# Patient Record
Sex: Female | Born: 1995 | Race: White | Hispanic: No | Marital: Married | Smoking: Never smoker
Health system: Southern US, Community
[De-identification: ages and names within clinical notes are randomized; demographics above are authoritative.]

## PROBLEM LIST (undated history)

## (undated) ENCOUNTER — Inpatient Hospital Stay: Payer: Self-pay

## (undated) DIAGNOSIS — F32A Depression, unspecified: Secondary | ICD-10-CM

## (undated) DIAGNOSIS — F329 Major depressive disorder, single episode, unspecified: Secondary | ICD-10-CM

## (undated) DIAGNOSIS — G43909 Migraine, unspecified, not intractable, without status migrainosus: Secondary | ICD-10-CM

## (undated) DIAGNOSIS — M940 Chondrocostal junction syndrome [Tietze]: Secondary | ICD-10-CM

## (undated) DIAGNOSIS — D649 Anemia, unspecified: Secondary | ICD-10-CM

## (undated) DIAGNOSIS — N920 Excessive and frequent menstruation with regular cycle: Secondary | ICD-10-CM

## (undated) DIAGNOSIS — R000 Tachycardia, unspecified: Secondary | ICD-10-CM

## (undated) DIAGNOSIS — F419 Anxiety disorder, unspecified: Secondary | ICD-10-CM

## (undated) HISTORY — PX: ADENOIDECTOMY: SUR15

## (undated) HISTORY — DX: Chondrocostal junction syndrome (tietze): M94.0

## (undated) HISTORY — PX: TONSILLECTOMY: SUR1361

## (undated) HISTORY — DX: Migraine, unspecified, not intractable, without status migrainosus: G43.909

## (undated) HISTORY — DX: Anxiety disorder, unspecified: F41.9

## (undated) HISTORY — DX: Major depressive disorder, single episode, unspecified: F32.9

## (undated) HISTORY — DX: Excessive and frequent menstruation with regular cycle: N92.0

## (undated) HISTORY — DX: Tachycardia, unspecified: R00.0

## (undated) HISTORY — DX: Depression, unspecified: F32.A

---
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
## 2016-06-29 ENCOUNTER — Encounter: Payer: Self-pay | Admitting: Emergency Medicine

## 2016-06-29 ENCOUNTER — Emergency Department
Admission: EM | Admit: 2016-06-29 | Discharge: 2016-06-29 | Disposition: A | Discharge status: Home / Self Care | Payer: Medicaid Other | Attending: Emergency Medicine | Admitting: Emergency Medicine

## 2016-06-29 DIAGNOSIS — N390 Urinary tract infection, site not specified: Secondary | ICD-10-CM | POA: Diagnosis not present

## 2016-06-29 DIAGNOSIS — R110 Nausea: Secondary | ICD-10-CM | POA: Diagnosis not present

## 2016-06-29 DIAGNOSIS — R350 Frequency of micturition: Secondary | ICD-10-CM | POA: Diagnosis present

## 2016-06-29 DIAGNOSIS — J45909 Unspecified asthma, uncomplicated: Secondary | ICD-10-CM | POA: Insufficient documentation

## 2016-06-29 LAB — URINALYSIS COMPLETE WITH MICROSCOPIC (ARMC ONLY)
Bilirubin Urine: NEGATIVE
Glucose, UA: NEGATIVE mg/dL
HGB URINE DIPSTICK: NEGATIVE
Ketones, ur: NEGATIVE mg/dL
LEUKOCYTES UA: NEGATIVE
NITRITE: NEGATIVE
PH: 6 (ref 5.0–8.0)
PROTEIN: NEGATIVE mg/dL
SPECIFIC GRAVITY, URINE: 1.017 (ref 1.005–1.030)

## 2016-06-29 LAB — POCT PREGNANCY, URINE: PREG TEST UR: NEGATIVE

## 2016-06-29 MED ORDER — NITROFURANTOIN MONOHYD MACRO 100 MG PO CAPS: 100.0000 mg | ORAL_CAPSULE | Freq: Two times a day (BID) | ORAL | 0 refills | Status: AC

## 2016-06-29 NOTE — ED Notes (Signed)
See triage note states she has been having some urinary /lower abd pressure for couple of days   Denies any n/v//d or fever or vaginal discharge.

## 2016-06-29 NOTE — Discharge Instructions (Signed)
Increase fluids. Take medication as directed. Follow-up with her primary care doctor in 10 days for recheck of your urine.

## 2016-06-29 NOTE — ED Triage Notes (Signed)
Reports pelvic pressure.  Mom states "we think she is pregnant but the test has been negative. Denies vag discharge or trouble with urination.

## 2016-06-29 NOTE — ED Provider Notes (Signed)
[facility] Emergency Department Provider Note  ____________________________________________   First MD Initiated Contact with Patient 06/29/16 [phone]     (approximate)  I have reviewed the triage vital signs and the nursing notes.   HISTORY  Chief Complaint Pelvic Pain   HPI Alexandra Joseph is a 20 y.o. female is here with complaint of pelvic pressure. Mother is with patient and states "we think she is pregnant". Patient had a negative pregnancy test at home and also at her primary care office. She continues to have symptoms of nausea and pressure that is unrelieved. Patient is here to have another pregnancy test done despite 2 negative test already. She is unaware of any fever or chills. There is some nausea. No vomiting. She states there is urinary frequency but no burning with urination. Currently she rates her pain as a 1/10. At this time patient has not actually missed a period. Mother states her last period was 7/19 through 7/24.   Past Medical History:  Diagnosis Date  . Asthma   . Costochondritis   . Depression   . Menorrhagia   . Migraine   . Tachycardia     Patient Active Problem List   Diagnosis Date Noted  . Headache 10/24/2014  . Dizziness 09/14/2014  . Chest pain at rest 09/14/2014  . Tachycardia 09/14/2014  . Weakness 09/14/2014    Past Surgical History:  Procedure Laterality Date  . ADENOIDECTOMY    . TONSILLECTOMY      Prior to Admission medications   Medication Sig Start Date End Date Taking? Authorizing Provider  escitalopram (LEXAPRO) 10 MG tablet Take 10 mg by mouth daily.  11/07/14   Historical Provider, MD  ibuprofen (ADVIL,MOTRIN) 200 MG tablet Take 200 mg by mouth every 6 (six) hours as needed.    Historical Provider, MD  Levonorgestrel-Ethinyl Estradiol (CAMRESE) 0.15-0.03 &0.01 MG tablet Take 1 tablet by mouth daily.    Historical Provider, MD  nitrofurantoin, macrocrystal-monohydrate, (MACROBID) 100 MG capsule  Take 1 capsule (100 mg total) by mouth 2 (two) times daily. 06/29/16 07/06/16  Tommi Rumpshonda L Summers, PA-C  propranolol (INDERAL) 10 MG tablet Take 1 tablet (10 mg total) by mouth 3 (three) times daily as needed. 10/24/14   Antonieta Ibaimothy J Gollan, MD    Allergies Review of patient's allergies indicates no known allergies.  Family History  Problem Relation Age of Onset  . Hypertension Mother   . Arrhythmia Mother     palpitations  . Hyperlipidemia Mother   . Heart attack Maternal Uncle 42  . Heart attack Maternal Grandmother   . Heart disease Maternal Grandmother     CABG  . Hypertension Maternal Grandfather     Social History Social History  Substance Use Topics  . Smoking status: Never Smoker  . Smokeless tobacco: Never Used  . Alcohol use No    Review of Systems Constitutional: No fever/chills Cardiovascular: Denies chest pain. Respiratory: Denies shortness of breath. Gastrointestinal: Positive for pelvic pressure. No nausea, no vomiting.  No diarrhea.  Genitourinary: Negative for dysuria. Positive for urinary frequency. Musculoskeletal: Negative for back pain. Skin: Negative for rash. Neurological: Negative for headaches, focal weakness or numbness.  10-point ROS otherwise negative.  ____________________________________________   PHYSICAL EXAM:  VITAL SIGNS: ED Triage Vitals  Enc Vitals Group     BP 06/29/16 0915 108/61     Pulse Rate 06/29/16 0915 76     Resp 06/29/16 0915 16     Temp 06/29/16 0915 98.1 F (36.7  C)     Temp Source 06/29/16 0915 Oral     SpO2 06/29/16 0915 100 %     Weight 06/29/16 0916 132 lb (59.9 kg)     Height 06/29/16 0916 5' (1.524 m)     Head Circumference --      Peak Flow --      Pain Score 06/29/16 0916 1     Pain Loc --      Pain Edu? --      Excl. in GC? --     Constitutional: Alert and oriented. Well appearing and in no acute distress. Eyes: Conjunctivae are normal. PERRL. EOMI. Head: Atraumatic. Nose: No  congestion/rhinnorhea. Neck: No stridor.   Cardiovascular: Normal rate, regular rhythm. Grossly normal heart sounds.  Good peripheral circulation. Respiratory: Normal respiratory effort.  No retractions. Lungs CTAB. Gastrointestinal: Soft and nontender. No distention. Bowel sounds normoactive 4 quadrants. Musculoskeletal: Moves upper and lower extremities without any difficulty. Normal gait was noted. Neurologic:  Normal speech and language. No gross focal neurologic deficits are appreciated. No gait instability. Skin:  Skin is warm, dry and intact. No rash noted. Psychiatric: Mood and affect are normal. Speech and behavior are normal.  ____________________________________________   LABS (all labs ordered are listed, but only abnormal results are displayed)  Labs Reviewed  URINALYSIS COMPLETEWITH MICROSCOPIC ([facility] ONLY) - Abnormal; Notable for the following:       Result Value   Color, Urine YELLOW (*)    APPearance CLOUDY (*)    Bacteria, UA RARE (*)    Squamous Epithelial / LPF 6-30 (*)    All other components within normal limits  URINE CULTURE  POC URINE PREG, ED  POCT PREGNANCY, URINE     PROCEDURES  Procedure(s) performed: None  Procedures  Critical Care performed: No  ____________________________________________   INITIAL IMPRESSION / ASSESSMENT AND PLAN / ED COURSE  Pertinent labs & imaging results that were available during my care of the patient were reviewed by me and considered in my medical decision making (see chart for details).    Clinical Course  Patient's pregnancy test here in the emergency room was also negative. Patient was made aware that she has a urinary tract infection and placed on Macrobid 100 mg twice a day for 7 days. Patient is follow-up with her primary care doctor. Mother requested an ultrasound to "make sure the tests his right". Patient's mother was referred back to her child's PCP for further testing. Patient is to increase fluids,  take medication and follow-up with her primary care doctor.   ____________________________________________   FINAL CLINICAL IMPRESSION(S) / ED DIAGNOSES  Final diagnoses:  UTI (lower urinary tract infection)      NEW MEDICATIONS STARTED DURING THIS VISIT:  Discharge Medication List as of 06/29/2016 10:18 AM    START taking these medications   Details  nitrofurantoin, macrocrystal-monohydrate, (MACROBID) 100 MG capsule Take 1 capsule (100 mg total) by mouth 2 (two) times daily., Starting Mon 06/29/2016, Until Mon 07/06/2016, Print         Note:  This document was prepared using Dragon voice recognition software and may include unintentional dictation errors.    Tommi RumpsRhonda L Summers, PA-C 06/29/16 1109    Myrna Blazeravid Matthew Schaevitz, MD 06/29/16 [phone]

## 2016-06-30 LAB — URINE CULTURE
Culture: NO GROWTH
Special Requests: NORMAL

## 2016-07-08 ENCOUNTER — Encounter: Payer: Self-pay | Admitting: Obstetrics and Gynecology

## 2016-07-08 ENCOUNTER — Other Ambulatory Visit: Payer: Self-pay | Admitting: Obstetrics and Gynecology

## 2016-07-08 ENCOUNTER — Ambulatory Visit (INDEPENDENT_AMBULATORY_CARE_PROVIDER_SITE_OTHER): Payer: Medicaid Other | Admitting: Obstetrics and Gynecology

## 2016-07-08 VITALS — BP 99/62 | HR 72 | Ht 60.0 in | Wt 134.2 lb

## 2016-07-08 DIAGNOSIS — G43001 Migraine without aura, not intractable, with status migrainosus: Secondary | ICD-10-CM | POA: Diagnosis not present

## 2016-07-08 DIAGNOSIS — N926 Irregular menstruation, unspecified: Secondary | ICD-10-CM | POA: Diagnosis not present

## 2016-07-08 LAB — POCT URINE PREGNANCY: Preg Test, Ur: NEGATIVE

## 2016-07-08 NOTE — Progress Notes (Signed)
    GYNECOLOGY CLINIC PROGRESS NOTE  Subjective:    Alexandra Joseph is a 20 y.o. female who presents for evaluation of amenorrhea. She believes she could be pregnant. Pregnancy is desired. Sexual Activity: single partner, contraception: none. Current symptoms also include: breast tenderness, fatigue, morning sickness, nausea and positive home pregnancy test however also notes second pregnancy test was negative. Last period was normal.  Patient's last menstrual period was 06/03/2016 (exact date).   Of note, patient notes she was seen in the ER last week, was placed on antibiotic for UTI. Has almost completed course.     Past Medical History:  Diagnosis Date  . Asthma   . Costochondritis   . Depression   . Menorrhagia   . Migraine   . Tachycardia    Family History  Problem Relation Age of Onset  . Hypertension Mother   . Arrhythmia Mother     palpitations  . Hyperlipidemia Mother   . Heart attack Maternal Uncle 42  . Heart attack Maternal Grandmother   . Heart disease Maternal Grandmother     CABG  . Hypertension Maternal Grandfather     Past Surgical History:  Procedure Laterality Date  . ADENOIDECTOMY    . TONSILLECTOMY      Social History   Social History  . Marital status: Single    Spouse name: N/A  . Number of children: N/A  . Years of education: N/A   Occupational History  . Not on file.   Social History Main Topics  . Smoking status: Never Smoker  . Smokeless tobacco: Never Used  . Alcohol use No  . Drug use: No  . Sexual activity: Yes    Birth control/ protection: None   Other Topics Concern  . Not on file   Social History Narrative  . No narrative on file    No current outpatient prescriptions on file prior to visit.   No current facility-administered medications on file prior to visit.     No Known Allergies   Review of Systems A comprehensive review of systems was negative except for: Neurological: positive for headaches, (has h/o  migraines, controlled with OCT meds)     Objective:    BP 99/62 (BP Location: Left Arm, Patient Position: Sitting, Cuff Size: Normal)   Pulse 72   Ht 5' (1.524 m)   Wt 134 lb 3.2 oz (60.9 kg)   LMP 06/03/2016 (Exact Date)   BMI 26.21 kg/m  General: alert, no distress and no acute distress    Lab Review Urine HCG: negative    Assessment:    Absence of menstruation.    Migraines  Plan:    Pregnancy Test: UPT today negative but home UPT positive.  Will order BHCG to confirm or r/o pregnancy.   If positive, will have patient scheduled for NOB intake, and ultrasound when appropriate.  H/o migraines, advised on Tylenol products only if pregnancy confirmed.   Hildred LaserAnika Kissa Campoy, MD [facility]

## 2016-07-09 ENCOUNTER — Telehealth: Payer: Self-pay | Admitting: Obstetrics and Gynecology

## 2016-07-09 LAB — BETA HCG QUANT (REF LAB)

## 2016-07-09 NOTE — Telephone Encounter (Signed)
Called pt's mother at number listed below, informed her of negative results. Beta less than 1.

## 2016-07-09 NOTE — Telephone Encounter (Signed)
Pt called and wanted you to call her mom with results instead of her. [phone]

## 2016-08-01 ENCOUNTER — Encounter: Payer: Self-pay | Admitting: Emergency Medicine

## 2016-08-01 ENCOUNTER — Emergency Department
Admission: EM | Admit: 2016-08-01 | Discharge: 2016-08-01 | Disposition: A | Discharge status: Home / Self Care | Payer: Medicaid Other | Attending: Emergency Medicine | Admitting: Emergency Medicine

## 2016-08-01 DIAGNOSIS — R102 Pelvic and perineal pain: Secondary | ICD-10-CM | POA: Diagnosis present

## 2016-08-01 DIAGNOSIS — J45909 Unspecified asthma, uncomplicated: Secondary | ICD-10-CM | POA: Diagnosis not present

## 2016-08-01 DIAGNOSIS — R109 Unspecified abdominal pain: Secondary | ICD-10-CM | POA: Insufficient documentation

## 2016-08-01 LAB — CBC
HEMATOCRIT: 40 % (ref 35.0–47.0)
Hemoglobin: 13.8 g/dL (ref 12.0–16.0)
MCH: 29 pg (ref 26.0–34.0)
MCHC: 34.4 g/dL (ref 32.0–36.0)
MCV: 84.3 fL (ref 80.0–100.0)
PLATELETS: 190 10*3/uL (ref 150–440)
RBC: 4.74 MIL/uL (ref 3.80–5.20)
RDW: 13.2 % (ref 11.5–14.5)
WBC: 5.7 10*3/uL (ref 3.6–11.0)

## 2016-08-01 LAB — COMPREHENSIVE METABOLIC PANEL
ALT: 20 U/L (ref 14–54)
AST: 21 U/L (ref 15–41)
Albumin: 4.8 g/dL (ref 3.5–5.0)
Alkaline Phosphatase: 57 U/L (ref 38–126)
Anion gap: 3 — ABNORMAL LOW (ref 5–15)
BILIRUBIN TOTAL: 0.6 mg/dL (ref 0.3–1.2)
BUN: 8 mg/dL (ref 6–20)
CHLORIDE: 107 mmol/L (ref 101–111)
CO2: 27 mmol/L (ref 22–32)
Calcium: 9.4 mg/dL (ref 8.9–10.3)
Creatinine, Ser: 0.63 mg/dL (ref 0.44–1.00)
Glucose, Bld: 99 mg/dL (ref 65–99)
POTASSIUM: 3.7 mmol/L (ref 3.5–5.1)
Sodium: 137 mmol/L (ref 135–145)
TOTAL PROTEIN: 7.4 g/dL (ref 6.5–8.1)

## 2016-08-01 LAB — URINALYSIS COMPLETE WITH MICROSCOPIC (ARMC ONLY)
BILIRUBIN URINE: NEGATIVE
Glucose, UA: NEGATIVE mg/dL
HGB URINE DIPSTICK: NEGATIVE
KETONES UR: NEGATIVE mg/dL
NITRITE: NEGATIVE
PH: 7 (ref 5.0–8.0)
PROTEIN: NEGATIVE mg/dL
Specific Gravity, Urine: 1.008 (ref 1.005–1.030)

## 2016-08-01 LAB — POCT PREGNANCY, URINE: Preg Test, Ur: NEGATIVE

## 2016-08-01 LAB — HCG, QUANTITATIVE, PREGNANCY: hCG, Beta Chain, Quant, S: <1 m[IU]/mL (ref <5)

## 2016-08-01 NOTE — ED Triage Notes (Signed)
Lower abdominal pain x 2 days. Has positive pregnancy test in august with a subsequent negative test.

## 2016-08-01 NOTE — ED Provider Notes (Signed)
[facility] Emergency Department Provider Note  ____________________________________________   I have reviewed the triage vital signs and the nursing notes.   HISTORY  Chief Complaint Abdominal Pain    HPI Alexandra Joseph is a 20 y.o. female with a history of painful and heavy periods in the past. She was placed on birth control pills which she was taken off of in April because they were causing her migraines. Since April she has had regular periods but last month around this time around the time of her period for the week prior and a few days after she had a strong pelvic cramping sensation that she had a period that was less strong than normal. Lasted less long in other words. Now, she is due for her menstrual period again and she is again having this cramping sensation in her lower abdomen. She denies any dysuria or urinary frequency and her last visit urine culture was negative. The patient has had no fever no chills she is hungry she has been eating and drinking fine she's had normal bowel movements she denies any sudden onset of discomfort, and a gradual onset pressure that's been there for a few days which she feels is leading up to her menstrual period. She did get married in the later part of the summer, and she is concerned that she might be pregnant although she has not had a positive pregnancy test since her last period. She did have one at home but then had a negative one here. She denies any vaginal discharge, she denies any severe pains,       Past Medical History:  Diagnosis Date  . Asthma   . Costochondritis   . Depression   . Menorrhagia   . Migraine   . Tachycardia     Patient Active Problem List   Diagnosis Date Noted  . Headache 10/24/2014  . Dizziness 09/14/2014  . Chest pain at rest 09/14/2014  . Tachycardia 09/14/2014  . Weakness 09/14/2014    Past Surgical History:  Procedure Laterality Date  . ADENOIDECTOMY    .  TONSILLECTOMY      Prior to Admission medications   Not on File    Allergies Review of patient's allergies indicates no known allergies.  Family History  Problem Relation Age of Onset  . Hypertension Mother   . Arrhythmia Mother     palpitations  . Hyperlipidemia Mother   . Heart attack Maternal Uncle 42  . Heart attack Maternal Grandmother   . Heart disease Maternal Grandmother     CABG  . Hypertension Maternal Grandfather     Social History Social History  Substance Use Topics  . Smoking status: Never Smoker  . Smokeless tobacco: Never Used  . Alcohol use No    Review of Systems Constitutional: No fever/chills Eyes: No visual changes. ENT: No sore throat. No stiff neck no neck pain Cardiovascular: Denies chest pain. Respiratory: Denies shortness of breath. Gastrointestinal:   no vomiting.  No diarrhea.  No constipation. Genitourinary: Negative for dysuria. Musculoskeletal: Negative lower extremity swelling Skin: Negative for rash. Neurological: Negative for severe headaches, focal weakness or numbness. 10-point ROS otherwise negative.  ____________________________________________   PHYSICAL EXAM:  VITAL SIGNS: ED Triage Vitals  Enc Vitals Group     BP 08/01/16 1858 113/66     Pulse Rate 08/01/16 1858 79     Resp 08/01/16 1858 18     Temp 08/01/16 1858 98.1 F (36.7 C)     Temp  Source 08/01/16 1858 Oral     SpO2 08/01/16 1858 98 %     Weight 08/01/16 1900 136 lb (61.7 kg)     Height 08/01/16 1900 5' (1.524 m)     Head Circumference --      Peak Flow --      Pain Score 08/01/16 1901 8     Pain Loc --      Pain Edu? --      Excl. in GC? --     Constitutional: Alert and oriented. Well appearing and in no acute distress. Eyes: Conjunctivae are normal. PERRL. EOMI. Head: Atraumatic. Nose: No congestion/rhinnorhea. Mouth/Throat: Mucous membranes are moist.  Oropharynx non-erythematous. Neck: No stridor.   Nontender with no  meningismus Cardiovascular: Normal rate, regular rhythm. Grossly normal heart sounds.  Good peripheral circulation. Respiratory: Normal respiratory effort.  No retractions. Lungs CTAB. Abdominal: Soft and nontender. No distention. No guarding no rebound Back:  There is no focal tenderness or step off.  there is no midline tenderness there are no lesions noted. there is no CVA tenderness GU: Patient declines Musculoskeletal: No lower extremity tenderness, no upper extremity tenderness. No joint effusions, no DVT signs strong distal pulses no edema Neurologic:  Normal speech and language. No gross focal neurologic deficits are appreciated.  Skin:  Skin is warm, dry and intact. No rash noted. Psychiatric: Mood and affect are normal. Speech and behavior are normal.  ____________________________________________   LABS (all labs ordered are listed, but only abnormal results are displayed)  Labs Reviewed  COMPREHENSIVE METABOLIC PANEL - Abnormal; Notable for the following:       Result Value   Anion gap 3 (*)    All other components within normal limits  URINALYSIS COMPLETEWITH MICROSCOPIC ([facility] ONLY) - Abnormal; Notable for the following:    Color, Urine STRAW (*)    APPearance CLEAR (*)    Leukocytes, UA 3+ (*)    Bacteria, UA RARE (*)    Squamous Epithelial / LPF 6-30 (*)    All other components within normal limits  URINE CULTURE  CHLAMYDIA/NGC RT PCR ([facility] ONLY)  CBC  HCG, QUANTITATIVE, PREGNANCY  POC URINE PREG, ED  POCT PREGNANCY, URINE   ____________________________________________  EKG   ____________________________________________  RADIOLOGY  I reviewed any imaging ordered by me or triage that were performed during my shift and, if possible, patient and/or family made aware of any abnormal findings. ____________________________________________   PROCEDURES  Procedure(s) performed: None  Procedures  Critical Care performed:  None  ____________________________________________   INITIAL IMPRESSION / ASSESSMENT AND PLAN / ED COURSE  Pertinent labs & imaging results that were available during my care of the patient were reviewed by me and considered in my medical decision making (see chart for details).  Patient here with recurrent pelvic cramping, low suspicion for UTI ovarian cyst appendicitis diverticulitis ovarian torsion or any other acute pathology that Friday. However, I did offer the patient a pelvic exam and ultrasound to further evaluate her pelvic cramping and she declines. She understands the risks benefits and alternative of declining and her family are in the room and they agree. She is having suprapubic cramping prior to her menstrual. After going off birth control this is not unusual. I did stress to her the need to follow-up with OB/GYN and to return to the emergency room if she feels any new or worrisome symptoms. Patient is very comfortable with this plan and she is relieved that she is not pregnant. Said her urine  but there are also epithelial cells, her last urine was negative I don't believe she has a cyclical, periodic, urinary tract infection and I will defer antibiotics as she has no symptoms of UTI pending culture.  Clinical Course   ____________________________________________   FINAL CLINICAL IMPRESSION(S) / ED DIAGNOSES  Final diagnoses:  None      This chart was dictated using voice recognition software.  Despite best efforts to proofread,  errors can occur which can change meaning.      Jeanmarie Plant, MD 08/01/16 [phone]

## 2016-08-03 LAB — URINE CULTURE

## 2016-11-16 NOTE — L&D Delivery Note (Signed)
Delivery Note At 0530 a viable and healthy female "Alexandra Joseph"  was delivered via  (Presentation:OA ;  ).  APGAR:8 ,9 ; weight 7#10oz  .   Placenta status: delivered intact with 3 vessel  Cord and dense calcification, 3cmold clot at anterior margin:  with the following complications: none  Anesthesia:  epidural Episiotomy:  none Lacerations:  1st Suture Repair: 3.0 vicryl rapide Est. Blood Loss (mL):  800  Mom to postpartum.  Baby to Couplet care / Skin to Skin.  Melody N Shambley 07/23/2017, 6:02 AM

## 2016-11-30 ENCOUNTER — Encounter: Payer: Self-pay | Admitting: Certified Nurse Midwife

## 2016-11-30 ENCOUNTER — Ambulatory Visit (INDEPENDENT_AMBULATORY_CARE_PROVIDER_SITE_OTHER): Admitting: Certified Nurse Midwife

## 2016-11-30 VITALS — BP 127/65 | HR 85 | Ht 60.0 in | Wt 133.2 lb

## 2016-11-30 DIAGNOSIS — Z3201 Encounter for pregnancy test, result positive: Secondary | ICD-10-CM

## 2016-11-30 DIAGNOSIS — N926 Irregular menstruation, unspecified: Secondary | ICD-10-CM | POA: Diagnosis not present

## 2016-11-30 LAB — POCT URINALYSIS DIPSTICK
Bilirubin, UA: NEGATIVE
GLUCOSE UA: NEGATIVE
Ketones, UA: NEGATIVE
Leukocytes, UA: NEGATIVE
Nitrite, UA: NEGATIVE
PROTEIN UA: NEGATIVE
RBC UA: NEGATIVE
UROBILINOGEN UA: NEGATIVE
pH, UA: 5

## 2016-11-30 LAB — POCT URINE PREGNANCY: Preg Test, Ur: POSITIVE — AB

## 2016-11-30 NOTE — Progress Notes (Signed)
Pt had 2 positive home pregnancy tests. C/o nausea and breast tenderness.

## 2016-11-30 NOTE — Patient Instructions (Signed)

## 2016-11-30 NOTE — Progress Notes (Signed)
GYN ENCOUNTER NOTE  Subjective:       Alexandra Joseph is a 21 y.o. G1P0 female is here for gynecologic evaluation of missed menses and positive home pregnancy test. Her mother, grandmother, and sister accompanied her to today's visit.   She had two (2) positive home pregnancy tests on 11/22/2016. Pt has been trying to get pregnancy for a few months. She is very excited.   Alexandra Joseph endorses nausea without vomiting and breast tenderness. Denies difficulty breathing or respiratory distress, chest pain, abdominal pain, vaginal bleeding, and leg pain or swelling.   Alexandra Joseph was married in July 2017. Her husband is in [employer] and currently stationed in [place]. She is unsure if she will finishing this pregnancy in [place] or in [place].    Gynecologic History Patient's last menstrual period was 10/16/2016 (exact date).   Contraception: none   EDD: 07/23/2017  Gestational age: 42 weeks 3 days  Obstetric History OB History  Gravida Para Term Preterm AB Living  1            SAB TAB Ectopic Multiple Live Births               # Outcome Date GA Lbr Len/2nd Weight Sex Delivery Anes PTL Lv  1 Current               Past Medical History:  Diagnosis Date  . Anxiety   . Asthma   . Costochondritis   . Depression   . Menorrhagia   . Migraine   . Tachycardia     Past Surgical History:  Procedure Laterality Date  . ADENOIDECTOMY    . TONSILLECTOMY      No current outpatient prescriptions on file prior to visit.   No current facility-administered medications on file prior to visit.     No Known Allergies  Social History   Social History  . Marital status: Single    Spouse name: N/A  . Number of children: N/A  . Years of education: N/A   Occupational History  . Not on file.   Social History Main Topics  . Smoking status: Never Smoker  . Smokeless tobacco: Never Used  . Alcohol use No  . Drug use: No  . Sexual activity: Yes    Birth control/ protection: None   Other Topics  Concern  . Not on file   Social History Narrative  . No narrative on file    Family History  Problem Relation Age of Onset  . Hypertension Mother   . Arrhythmia Mother     palpitations  . Hyperlipidemia Mother   . Heart attack Maternal Uncle 42  . Heart attack Maternal Grandmother   . Heart disease Maternal Grandmother     CABG  . Stroke Maternal Grandmother   . Osteoarthritis Maternal Grandmother   . Hypertension Maternal Grandfather   . Stroke Maternal Grandfather     The following portions of the patient's history were reviewed and updated as appropriate: allergies, current medications, past family history, past medical history, past social history, past surgical history and problem list.  Review of Systems  Review of Systems - Negative except as noted above History obtained from the patient    Objective:   BP 127/65   Pulse 85   Ht 5' (1.524 m)   Wt 133 lb 4 oz (60.4 kg)   LMP 10/16/2016 (Exact Date)   BMI 26.02 kg/m   Alert and oriented x 4  Physical exam: not indicated  Positive UPT  Assessment:   1. Missed menses  2. Positive pregnancy test  Plan:   1. Reviewed pregnancy nutrition, safe medications, and genetic testing. Handouts given.   2. Reviewed red flag symptoms and when to call.  3. RTC x 1-2 weeks for dating/viability US and Nurse intake.  4. Start PNV with DHA and folic acid, samples given.   Gunnar BullaJenkins Michelle Jerritt Cardoza, CNM

## 2016-12-08 ENCOUNTER — Telehealth: Payer: Self-pay | Admitting: Certified Nurse Midwife

## 2016-12-08 NOTE — Telephone Encounter (Signed)
Alexandra Joseph is [redacted] wks pregnant and is very sick. She has throwing up the last 3 days. She wants something for nausea

## 2016-12-08 NOTE — Telephone Encounter (Signed)
Called pt no answer. LM for pt informing her of the need to try vitamin b6 and Unisom before prescription medications, advised pt on how to take, also sent my chart message with pt education

## 2016-12-15 ENCOUNTER — Other Ambulatory Visit

## 2016-12-15 ENCOUNTER — Encounter

## 2016-12-18 ENCOUNTER — Ambulatory Visit (INDEPENDENT_AMBULATORY_CARE_PROVIDER_SITE_OTHER): Admitting: Certified Nurse Midwife

## 2016-12-18 ENCOUNTER — Other Ambulatory Visit: Payer: Self-pay | Admitting: Certified Nurse Midwife

## 2016-12-18 ENCOUNTER — Encounter: Payer: Self-pay | Admitting: Certified Nurse Midwife

## 2016-12-18 VITALS — BP 111/58 | HR 64 | Temp 98.4°F | Wt 129.2 lb

## 2016-12-18 DIAGNOSIS — Z3491 Encounter for supervision of normal pregnancy, unspecified, first trimester: Secondary | ICD-10-CM | POA: Diagnosis not present

## 2016-12-18 DIAGNOSIS — R109 Unspecified abdominal pain: Secondary | ICD-10-CM

## 2016-12-18 DIAGNOSIS — M549 Dorsalgia, unspecified: Secondary | ICD-10-CM

## 2016-12-18 LAB — POCT URINALYSIS DIPSTICK
BILIRUBIN UA: NEGATIVE
Blood, UA: NEGATIVE
Glucose, UA: NEGATIVE
KETONES UA: NEGATIVE
LEUKOCYTES UA: NEGATIVE
Nitrite, UA: NEGATIVE
Protein, UA: NEGATIVE
Spec Grav, UA: 1.02
Urobilinogen, UA: NEGATIVE
pH, UA: 5

## 2016-12-18 NOTE — Progress Notes (Signed)
Pt is here with c/o right side back pain radiating to front. Pt is approx [redacted] weeks pregnant.

## 2016-12-18 NOTE — Progress Notes (Signed)
Subjective:   Alexandra Joseph is a 21 y.o. G1P0 9 weeks 0 days being seen today for work in problem visit.  Patient reports backache and right kidney pain that comes and goes since Monday.  Pain: 6/10. She states that she has been unable to eat or drink due to pain, but has not taken any Tylenol.   Denies contractions, vaginal bleeding or leaking of fluid.   She denies difficulty breathing or respiratory distress, chest pain, abdominal pain, vaginal bleeding, and leg pain or swelling.   She cancelled her nurse intake and dating/viability US scheduled for 12/15/2016. Her next appointment is 12/23/2016.  The following portions of the patient's history were reviewed and updated as appropriate: allergies, current medications, past family history, past medical history, past social history, past surgical history and problem list.   Objective:  BP (!) 111/58   Pulse 64   Temp 98.4 F (36.9 C)   Wt 129 lb 4 oz (58.6 kg)   LMP 10/16/2016 (Exact Date)   BMI 25.24 kg/m   Positive CVAT, right > left  Abdomen:  soft, gravid, appropriate for gestational age,non-tender   Results for orders placed or performed in visit on 12/18/16 (from the past 24 hour(s))  POCT urinalysis dipstick     Status: None   Collection Time: 12/18/16  1:53 PM  Result Value Ref Range   Color, UA dark yellow    Clarity, UA clear    Glucose, UA neg    Bilirubin, UA neg    Ketones, UA neg    Spec Grav, UA 1.020    Blood, UA neg    pH, UA 5.0    Protein, UA neg    Urobilinogen, UA negative    Nitrite, UA neg    Leukocytes, UA Negative Negative    Assessment:    Pregnancy:  G1P0 at Unknown  1. Prenatal care in first trimester - POCT urinalysis dipstick  2. Right flank pain - US Renal; Future  Plan:    1. Encouraged hydration and Tylenol for discomfort.   2. Renal US scheduled  3. Reviewed early pregnancy precautions and when to call.   4. ED for increased pain, fever, or worsen symptoms, pt  verbalized understanding.   5. Keep previously scheduled appointment.    Gunnar BullaJenkins Michelle Bennett Ram, CNM

## 2016-12-21 ENCOUNTER — Ambulatory Visit: Payer: Medicaid Other

## 2016-12-23 ENCOUNTER — Ambulatory Visit (INDEPENDENT_AMBULATORY_CARE_PROVIDER_SITE_OTHER): Admitting: Certified Nurse Midwife

## 2016-12-23 ENCOUNTER — Ambulatory Visit (INDEPENDENT_AMBULATORY_CARE_PROVIDER_SITE_OTHER)

## 2016-12-23 VITALS — BP 107/58 | HR 66 | Ht 60.0 in | Wt 128.7 lb

## 2016-12-23 DIAGNOSIS — Z3201 Encounter for pregnancy test, result positive: Secondary | ICD-10-CM | POA: Diagnosis not present

## 2016-12-23 DIAGNOSIS — Z1389 Encounter for screening for other disorder: Secondary | ICD-10-CM

## 2016-12-23 DIAGNOSIS — N926 Irregular menstruation, unspecified: Secondary | ICD-10-CM | POA: Diagnosis not present

## 2016-12-23 DIAGNOSIS — Z3401 Encounter for supervision of normal first pregnancy, first trimester: Secondary | ICD-10-CM

## 2016-12-23 DIAGNOSIS — Z113 Encounter for screening for infections with a predominantly sexual mode of transmission: Secondary | ICD-10-CM

## 2016-12-23 NOTE — Progress Notes (Signed)
Alexandra Joseph presents for NOB nurse interview visit. Pregnancy confirmation done 12/18/2016 by JML. UPT: positive. LMP: 10/16/2016. Ultrasound done today resulted in fetus measuring 8.5 wks. (+1wk).  G-1  P-0.  Pregnancy education material explained and given.  No cats in the home. NOB labs ordered.  HIV labs and Drug screen were explained optional and she did not decline. Drug screen ordered. PNV encouraged. Genetic screening options discussed and pt unsure at this time. She has a brother that has special needs, since birth. Pt continues to have right flank pain that comes and goes. Has appt tomorrow for kidney ultrasound, was unable to go yesterday related to insurance issues.  Pt. To follow up with provider in 3 weeks for NOB physical.  All questions answered. Pt's husband is in the service and away from home. Mother with pt.

## 2016-12-23 NOTE — Patient Instructions (Signed)
Pregnancy and Zika Virus Disease Introduction Zika virus disease, or Zika, is an illness that can spread to people from mosquitoes that carry the virus. It may also spread from person to person through infected body fluids. Zika first occurred in [place], but recently it has spread to new areas. The virus occurs in tropical climates. The location of Zika continues to change. Most people who become infected with Zika virus do not develop serious illness. However, Zika may cause birth defects in an unborn baby whose mother is infected with the virus. It may also increase the risk of miscarriage. What are the symptoms of Zika virus disease? In many cases, people who have been infected with Zika virus do not develop any symptoms. If symptoms appear, they usually start about a week after the person is infected. Symptoms are usually mild. They may include:  Fever.  Rash.  Red eyes.  Joint pain. How does Zika virus disease spread? The main way that Zika virus spreads is through the bite of a certain type of mosquito. Unlike most types of mosquitos, which bite only at night, the type of mosquito that carries Zika virus bites both at night and during the day. Zika virus can also spread through sexual contact, through a blood transfusion, and from a mother to her baby before or during birth. Once you have had Zika virus disease, it is unlikely that you will get it again. Can I pass Zika to my baby during pregnancy? Yes, Zika can pass from a mother to her baby before or during birth. What problems can Zika cause for my baby? A woman who is infected with Zika virus while pregnant is at risk of having her baby born with a condition in which the brain or head is smaller than expected (microcephaly). Babies who have microcephaly can have developmental delays, seizures, hearing problems, and vision problems. Having Zika virus disease during pregnancy can also increase the risk of miscarriage. How can Zika  virus disease be prevented? There is no vaccine to prevent Zika. The best way to prevent the disease is to avoid infected mosquitoes and avoid exposure to body fluids that can spread the virus. Avoid any possible exposure to Zika by taking the following precautions. For women and their sex partners:  Avoid traveling to high-risk areas. The locations where Zika is being reported change often. To identify high-risk areas, check the CDC travel website: www.cdc.gov/zika/geo/index.html  If you or your sex partner must travel to a high-risk area, talk with a health care provider before and after traveling.  Take all precautions to avoid mosquito bites if you live in, or travel to, any of the high-risk areas. Insect repellents are safe to use during pregnancy.  Ask your health care provider when it is safe to have sexual contact. For women:  If you are pregnant or trying to become pregnant, avoid sexual contact with persons who may have been exposed to Zika virus, persons who have possible symptoms of Zika, or persons whose history you are unsure about. If you choose to have sexual contact with someone who may have been exposed to Zika virus, use condoms correctly during the entire duration of sexual activity, every time. Do not share sexual devices, as you may be exposed to body fluids.  Ask your health care provider about when it is safe to attempt pregnancy after a possible exposure to Zika virus. What steps should I take to avoid mosquito bites? Take these steps to avoid mosquito bites when you   are in a high-risk area:  Wear loose clothing that covers your arms and legs.  Limit your outdoor activities.  Do not open windows unless they have window screens.  Sleep under mosquito nets.  Use insect repellent. The best insect repellents have:  DEET, picaridin, oil of lemon eucalyptus (OLE), or IR3535 in them.  Higher amounts of an active ingredient in them.  Remember that insect repellents  are safe to use during pregnancy.  Do not use OLE on children who are younger than 3 years of age. Do not use insect repellent on babies who are younger than 2 months of age.  Cover your child's stroller with mosquito netting. Make sure the netting fits snugly and that any loose netting does not cover your child's mouth or nose. Do not use a blanket as a mosquito-protection cover.  Do not apply insect repellent underneath clothing.  If you are using sunscreen, apply the sunscreen before applying the insect repellent.  Treat clothing with permethrin. Do not apply permethrin directly to your skin. Follow label directions for safe use.  Get rid of standing water, where mosquitoes may reproduce. Standing water is often found in items such as buckets, bowls, animal food dishes, and flowerpots. When you return from traveling to any high-risk area, continue taking actions to protect yourself against mosquito bites for 3 weeks, even if you show no signs of illness. This will prevent spreading Zika virus to uninfected mosquitoes. What should I know about the sexual transmission of Zika? People can spread Zika to their sexual partners during vaginal, anal, or oral sex, or by sharing sexual devices. Many people with Zika do not develop symptoms, so a person could spread the disease without knowing that they are infected. The greatest risk is to women who are pregnant or who may become pregnant. Zika virus can live longer in semen than it can live in blood. Couples can prevent sexual transmission of the virus by:  Using condoms correctly during the entire duration of sexual activity, every time. This includes vaginal, anal, and oral sex.  Not sharing sexual devices. Sharing increases your risk of being exposed to body fluid from another person.  Avoiding all sexual activity until your health care provider says it is safe. Should I be tested for Zika virus? A sample of your blood can be tested for Zika  virus. A pregnant woman should be tested if she may have been exposed to the virus or if she has symptoms of Zika. She may also have additional tests done during her pregnancy, such ultrasound testing. Talk with your health care provider about which tests are recommended. This information is not intended to replace advice given to you by your health care provider. Make sure you discuss any questions you have with your health care provider. Document Released: 07/24/2015 Document Revised: 04/09/2016 Document Reviewed: 07/17/2015  2017 Elsevier Minor Illnesses and Medications in Pregnancy  Cold/Flu:  Sudafed for congestion- Robitussin (plain) for cough- Tylenol for discomfort.  Please follow the directions on the label.  Try not to take any more than needed.  OTC Saline nasal spray and air humidifier or cool-mist  Vaporizer to sooth nasal irritation and to loosen congestion.  It is also important to increase intake of non carbonated fluids, especially if you have a fever.  Constipation:  Colace-2 capsules at bedtime; Metamucil- follow directions on label; Senokot- 1 tablet at bedtime.  Any one of these medications can be used.  It is also very important to increase   fluids and fruits along with regular exercise.  If problem persists please call the office.  Diarrhea:  Kaopectate as directed on the label.  Eat a bland diet and increase fluids.  Avoid highly seasoned foods.  Headache:  Tylenol 1 or 2 tablets every 3-4 hours as needed  Indigestion:  Maalox, Mylanta, Tums or Rolaids- as directed on label.  Also try to eat small meals and avoid fatty, greasy or spicy foods.  Nausea with or without Vomiting:  Nausea in pregnancy is caused by increased levels of hormones in the body which influence the digestive system and cause irritation when stomach acids accumulate.  Symptoms usually subside after 1st trimester of pregnancy.  Try the following: 1. Keep saltines, graham crackers or dry toast by your bed to  eat upon awakening. 2. Don't let your stomach get empty.  Try to eat 5-6 small meals per day instead of 3 large ones. 3. Avoid greasy fatty or highly seasoned foods.  4. Take OTC Unisom 1 tablet at bed time along with OTC Vitamin B6 25-50 mg 3 times per day.    If nausea continues with vomiting and you are unable to keep down food and fluids you may need a prescription medication.  Please notify your provider.   Sore throat:  Chloraseptic spray, throat lozenges and or plain Tylenol.  Vaginal Yeast Infection:  OTC Monistat for 7 days as directed on label.  If symptoms do not resolve within a week notify provider.  If any of the above problems do not subside with recommended treatment please call the office for further assistance.   Do not take Aspirin, Advil, Motrin or Ibuprofen.  * * OTC= Over the counter Hyperemesis Gravidarum Hyperemesis gravidarum is a severe form of nausea and vomiting that happens during pregnancy. Hyperemesis is worse than morning sickness. It may cause you to have nausea or vomiting all day for many days. It may keep you from eating and drinking enough food and liquids. Hyperemesis usually occurs during the first half (the first 20 weeks) of pregnancy. It often goes away once a woman is in her second half of pregnancy. However, sometimes hyperemesis continues through an entire pregnancy. What are the causes? The cause of this condition is not known. It may be related to changes in chemicals (hormones) in the body during pregnancy, such as the high level of pregnancy hormone (human chorionic gonadotropin) or the increase in the female sex hormone (estrogen). What are the signs or symptoms? Symptoms of this condition include:  Severe nausea and vomiting.  Nausea that does not go away.  Vomiting that does not allow you to keep any food down.  Weight loss.  Body fluid loss (dehydration).  Having no desire to eat, or not liking food that you have previously  enjoyed. How is this diagnosed? This condition may be diagnosed based on:  A physical exam.  Your medical history.  Your symptoms.  Blood tests.  Urine tests. How is this treated? This condition may be managed with medicine. If medicines to do not help relieve nausea and vomiting, you may need to receive fluids through an IV tube at the hospital. Follow these instructions at home:  Take over-the-counter and prescription medicines only as told by your health care provider.  Avoid iron pills and multivitamins that contain iron for the first 3-4 months of pregnancy. If you take prescription iron pills, do not stop taking them unless your health care provider approves.  Take the following actions to help   prevent nausea and vomiting:  In the morning, before getting out of bed, try eating a couple of dry crackers or a piece of toast.  Avoid foods and smells that upset your stomach. Fatty and spicy foods may make nausea worse.  Eat 5-6 small meals a day.  Do not drink fluids while eating meals. Drink between meals.  Eat or suck on things that have ginger in them. Ginger can help relieve nausea.  Avoid food preparation. The smell of food can spoil your appetite or trigger nausea.  Follow instructions from your health care provider about eating or drinking restrictions.  For snacks, eat high-protein foods, such as cheese.  Keep all follow-up and pre-birth (prenatal) visits as told by your health care provider. This is important. Contact a health care provider if:  You have pain in your abdomen.  You have a severe headache.  You have vision problems.  You are losing weight. Get help right away if:  You cannot drink fluids without vomiting.  You vomit blood.  You have constant nausea and vomiting.  You are very weak.  You are very thirsty.  You feel dizzy.  You faint.  You have a fever or other symptoms that last for more than 2-3 days.  You have a fever and  your symptoms suddenly get worse. Summary  Hyperemesis gravidarum is a severe form of nausea and vomiting that happens during pregnancy.  Making some changes to your eating habits may help relieve nausea and vomiting.  This condition may be managed with medicine.  If medicines to do not help relieve nausea and vomiting, you may need to receive fluids through an IV tube at the hospital. This information is not intended to replace advice given to you by your health care provider. Make sure you discuss any questions you have with your health care provider. Document Released: 11/02/2005 Document Revised: 07/01/2016 Document Reviewed: 07/01/2016 Elsevier Interactive Patient Education  2017 Else[employer] Commonly Asked Questions During Pregnancy  Cats: A parasite can be excreted in cat feces.  To avoid exposure you need to have another person empty the little box.  If you must empty the litter box you will need to wear gloves.  Wash your hands after handling your cat.  This parasite can also be found in raw or undercooked meat so this should also be avoided.  Colds, Sore Throats, Flu: Please check your medication sheet to see what you can take for symptoms.  If your symptoms are unrelieved by these medications please call the office.  Dental Work: Most any dental work your dentist recommends is permitted.  X-rays should only be taken during the first trimester if absolutely necessary.  Your abdomen should be shielded with a lead apron during all x-rays.  Please notify your provider prior to receiving any x-rays.  Novocaine is fine; gas is not recommended.  If your dentist requires a note from us prior to dental work please call the office and we will provide one for you.  Exercise: Exercise is an important part of staying healthy during your pregnancy.  You may continue most exercises you were accustomed to prior to pregnancy.  Later in your pregnancy you will most likely notice you have difficulty  with activities requiring balance like riding a bicycle.  It is important that you listen to your body and avoid activities that put you at a higher risk of falling.  Adequate rest and staying well hydrated are a must!  If you have questions   about the safety of specific activities ask your provider.    Exposure to Children with illness: Try to avoid obvious exposure; report any symptoms to us when noted,  If you have chicken pos, red measles or mumps, you should be immune to these diseases.   Please do not take any vaccines while pregnant unless you have checked with your OB provider.  Fetal Movement: After 28 weeks we recommend you do "kick counts" twice daily.  Lie or sit down in a calm quiet environment and count your baby movements "kicks".  You should feel your baby at least 10 times per hour.  If you have not felt 10 kicks within the first hour get up, walk around and have something sweet to eat or drink then repeat for an additional hour.  If count remains less than 10 per hour notify your provider.  Fumigating: Follow your pest control agent's advice as to how long to stay out of your home.  Ventilate the area well before re-entering.  Hemorrhoids:   Most over-the-counter preparations can be used during pregnancy.  Check your medication to see what is safe to use.  It is important to use a stool softener or fiber in your diet and to drink lots of liquids.  If hemorrhoids seem to be getting worse please call the office.   Hot Tubs:  Hot tubs Jacuzzis and saunas are not recommended while pregnant.  These increase your internal body temperature and should be avoided.  Intercourse:  Sexual intercourse is safe during pregnancy as long as you are comfortable, unless otherwise advised by your provider.  Spotting may occur after intercourse; report any bright red bleeding that is heavier than spotting.  Labor:  If you know that you are in labor, please go to the hospital.  If you are unsure, please  call the office and let us help you decide what to do.  Lifting, straining, etc:  If your job requires heavy lifting or straining please check with your provider for any limitations.  Generally, you should not lift items heavier than that you can lift simply with your hands and arms (no back muscles)  Painting:  Paint fumes do not harm your pregnancy, but may make you ill and should be avoided if possible.  Latex or water based paints have less odor than oils.  Use adequate ventilation while painting.  Permanents & Hair Color:  Chemicals in hair dyes are not recommended as they cause increase hair dryness which can increase hair loss during pregnancy.  " Highlighting" and permanents are allowed.  Dye may be absorbed differently and permanents may not hold as well during pregnancy.  Sunbathing:  Use a sunscreen, as skin burns easily during pregnancy.  Drink plenty of fluids; avoid over heating.  Tanning Beds:  Because their possible side effects are still unknown, tanning beds are not recommended.  Ultrasound Scans:  Routine ultrasounds are performed at approximately 20 weeks.  You will be able to see your baby's general anatomy an if you would like to know the gender this can usually be determined as well.  If it is questionable when you conceived you may also receive an ultrasound early in your pregnancy for dating purposes.  Otherwise ultrasound exams are not routinely performed unless there is a medical necessity.  Although you can request a scan we ask that you pay for it when conducted because insurance does not cover " patient request" scans.  Work: If your pregnancy proceeds without complications you   may work until your due date, unless your physician or employer advises otherwise.  Round Ligament Pain/Pelvic Discomfort:  Sharp, shooting pains not associated with bleeding are fairly common, usually occurring in the second trimester of pregnancy.  They tend to be worse when standing up or when  you remain standing for long periods of time.  These are the result of pressure of certain pelvic ligaments called "round ligaments".  Rest, Tylenol and heat seem to be the most effective relief.  As the womb and fetus grow, they rise out of the pelvis and the discomfort improves.  Please notify the office if your pain seems different than that described.  It may represent a more serious condition.   

## 2016-12-24 ENCOUNTER — Ambulatory Visit: Admission: RE | Admit: 2016-12-24 | Payer: Medicaid Other | Source: Ambulatory Visit

## 2016-12-24 LAB — URINALYSIS, ROUTINE W REFLEX MICROSCOPIC
BILIRUBIN UA: NEGATIVE
Glucose, UA: NEGATIVE
KETONES UA: NEGATIVE
LEUKOCYTES UA: NEGATIVE
Nitrite, UA: NEGATIVE
RBC UA: NEGATIVE
UUROB: 0.2 mg/dL (ref 0.2–1.0)
pH, UA: 5.5 (ref 5.0–7.5)

## 2016-12-24 LAB — MONITOR DRUG PROFILE 14(MW)
Amphetamine Scrn, Ur: NEGATIVE ng/mL
BARBITURATE SCREEN URINE: NEGATIVE ng/mL
BENZODIAZEPINE SCREEN, URINE: NEGATIVE ng/mL
BUPRENORPHINE, URINE: NEGATIVE ng/mL
CANNABINOIDS UR QL SCN: NEGATIVE ng/mL
COCAINE(METAB.)SCREEN, URINE: NEGATIVE ng/mL
Creatinine(Crt), U: 133.3 mg/dL (ref 20.0–300.0)
Fentanyl, Urine: NEGATIVE pg/mL
MEPERIDINE SCREEN, URINE: NEGATIVE ng/mL
Methadone Screen, Urine: NEGATIVE ng/mL
OXYCODONE+OXYMORPHONE UR QL SCN: NEGATIVE ng/mL
Opiate Scrn, Ur: NEGATIVE ng/mL
PHENCYCLIDINE QUANTITATIVE URINE: NEGATIVE ng/mL
Ph of Urine: 6 (ref 4.5–8.9)
Propoxyphene Scrn, Ur: NEGATIVE ng/mL
SPECIFIC GRAVITY: 1.015
TRAMADOL SCREEN, URINE: NEGATIVE ng/mL

## 2016-12-24 LAB — NICOTINE SCREEN, URINE: Cotinine Ql Scrn, Ur: POSITIVE ng/mL

## 2016-12-25 LAB — RUBELLA SCREEN: Rubella Antibodies, IGG: 1.97 index (ref >0.99)

## 2016-12-25 LAB — CBC WITH DIFFERENTIAL/PLATELET
BASOS ABS: 0 10*3/uL (ref 0.0–0.2)
BASOS: 0 %
EOS (ABSOLUTE): 0.1 10*3/uL (ref 0.0–0.4)
EOS: 1 %
HEMATOCRIT: 40.2 % (ref 34.0–46.6)
HEMOGLOBIN: 13.6 g/dL (ref 11.1–15.9)
IMMATURE GRANS (ABS): 0 10*3/uL (ref 0.0–0.1)
Immature Granulocytes: 0 %
LYMPHS ABS: 2 10*3/uL (ref 0.7–3.1)
LYMPHS: 25 %
MCH: 29.3 pg (ref 26.6–33.0)
MCHC: 33.8 g/dL (ref 31.5–35.7)
MCV: 87 fL (ref 79–97)
MONOCYTES: 7 %
Monocytes Absolute: 0.5 10*3/uL (ref 0.1–0.9)
NEUTROS ABS: 5.3 10*3/uL (ref 1.4–7.0)
Neutrophils: 67 %
Platelets: 190 10*3/uL (ref 150–379)
RBC: 4.64 x10E6/uL (ref 3.77–5.28)
RDW: 14.2 % (ref 12.3–15.4)
WBC: 8 10*3/uL (ref 3.4–10.8)

## 2016-12-25 LAB — HEPATITIS B SURFACE ANTIGEN: HEP B S AG: NEGATIVE

## 2016-12-25 LAB — GC/CHLAMYDIA PROBE AMP
CHLAMYDIA, DNA PROBE: NEGATIVE
NEISSERIA GONORRHOEAE BY PCR: NEGATIVE

## 2016-12-25 LAB — RPR: RPR: NONREACTIVE

## 2016-12-25 LAB — HIV ANTIBODY (ROUTINE TESTING W REFLEX): HIV Screen 4th Generation wRfx: NONREACTIVE

## 2016-12-25 LAB — VARICELLA ZOSTER ANTIBODY, IGG: VARICELLA: 1151 {index} (ref >165)

## 2016-12-25 LAB — ABO

## 2016-12-25 LAB — RH TYPE: RH TYPE: POSITIVE

## 2016-12-25 LAB — ANTIBODY SCREEN: Antibody Screen: NEGATIVE

## 2016-12-27 LAB — URINE CULTURE, OB REFLEX

## 2016-12-27 LAB — CULTURE, OB URINE

## 2017-01-07 ENCOUNTER — Encounter: Payer: Self-pay | Admitting: Certified Nurse Midwife

## 2017-01-07 ENCOUNTER — Ambulatory Visit (INDEPENDENT_AMBULATORY_CARE_PROVIDER_SITE_OTHER): Admitting: Certified Nurse Midwife

## 2017-01-07 VITALS — BP 115/69 | HR 78 | Wt 126.8 lb

## 2017-01-07 DIAGNOSIS — Z3401 Encounter for supervision of normal first pregnancy, first trimester: Secondary | ICD-10-CM

## 2017-01-07 DIAGNOSIS — Z1389 Encounter for screening for other disorder: Secondary | ICD-10-CM

## 2017-01-07 LAB — POCT URINALYSIS DIPSTICK
Bilirubin, UA: NEGATIVE
Blood, UA: NEGATIVE
GLUCOSE UA: NEGATIVE
Ketones, UA: NEGATIVE
Nitrite, UA: NEGATIVE
PROTEIN UA: NEGATIVE
Spec Grav, UA: 1.025
UROBILINOGEN UA: NEGATIVE
pH, UA: 6

## 2017-01-07 NOTE — Progress Notes (Signed)
NEW OB HISTORY AND PHYSICAL  SUBJECTIVE:       Alexandra Joseph is a 21 y.o. G1P0 female, Patient's last menstrual period was 10/16/2016 (exact date)., Estimated Date of Delivery: 07/30/17, [redacted]w[redacted]d, presents today for establishment of Prenatal Care.  She complains of nausea with a single episode of vomiting each day. She is not taking anything to treat her nausea at this time.   Alexandra Joseph endorses breast tenderness that is resolving with pregnancy.   She is taking [employer] Prenatal gummies without difficulty.   Denies difficulty breathing or respiratory distress, chest pain, abdominal pain, vaginal bleeding, and leg pain or swelling.   Alexandra Joseph is leaving for [place] in the morning. Her husband is stations a [employer]. Encouraged pt to follow up with midwives at [facility] and visit [facility].   She desires genetic testing, but will contact [employer] today to check coverage for [employer] and follow up as needed.   Gynecologic History  Patient's last menstrual period was 10/16/2016 (exact date).   EDD: 07/30/2017 based on 8 weeks 5 days [place] on 12/23/2016.  Contraception: none  Obstetric History OB History  Gravida Para Term Preterm AB Living  1            SAB TAB Ectopic Multiple Live Births               # Outcome Date GA Lbr Len/2nd Weight Sex Delivery Anes PTL Lv  1 Current               Past Medical History:  Diagnosis Date  . Anxiety   . Asthma   . Costochondritis   . Depression   . Menorrhagia   . Migraine   . Tachycardia     Past Surgical History:  Procedure Laterality Date  . ADENOIDECTOMY    . TONSILLECTOMY      Current Outpatient Prescriptions on File Prior to Visit  Medication Sig Dispense Refill  . Prenatal Vit-Fe Fumarate-FA (PRENATAL MULTIVITAMIN) TABS tablet Take 1 tablet by mouth daily at 12 noon.     No current facility-administered medications on file prior to visit.     No Known Allergies  Social  History   Social History  . Marital status: Single    Spouse name: N/A  . Number of children: N/A  . Years of education: N/A   Occupational History  . Not on file.   Social History Main Topics  . Smoking status: Never Smoker  . Smokeless tobacco: Never Used  . Alcohol use No  . Drug use: No  . Sexual activity: Yes    Birth control/ protection: None   Other Topics Concern  . Not on file   Social History Narrative  . No narrative on file    Family History  Problem Relation Age of Onset  . Hypertension Mother   . Arrhythmia Mother     palpitations  . Hyperlipidemia Mother   . Heart attack Maternal Uncle 42  . Heart attack Maternal Grandmother   . Heart disease Maternal Grandmother     CABG  . Stroke Maternal Grandmother   . Osteoarthritis Maternal Grandmother   . Hypertension Maternal Grandfather   . Stroke Maternal Grandfather     The following portions of the patient's history were reviewed and updated as appropriate: allergies, current medications, past OB history, past medical history, past surgical history, past family history, past social history, and problem list.  OBJECTIVE: Initial Physical Exam (New  OB)  GENERAL APPEARANCE: alert, well appearing, in no apparent distress  HEAD: normocephalic, atraumatic  MOUTH: mucous membranes moist, pharynx normal without lesions and dental hygiene good  THYROID: no thyromegaly or masses present  BREASTS: no masses noted, no significant tenderness, no palpable axillary nodes, no skin changes  LUNGS: clear to auscultation, no wheezes, rales or rhonchi, symmetric air entry  HEART: regular rate and rhythm, no murmurs  ABDOMEN: soft, nontender, nondistended, no abnormal masses, no epigastric pain, fundus not palpable and FHT present  EXTREMITIES: no redness or tenderness in the calves or thighs, no edema  SKIN: normal coloration and turgor, no rashes  LYMPH NODES: no adenopathy palpable  NEUROLOGIC: alert,  oriented, normal speech, no focal findings or movement disorder noted  PELVIC EXAM EXTERNAL GENITALIA: normal appearing vulva with no masses, tenderness or lesions VAGINA: no abnormal discharge or lesions CERVIX: no lesions or cervical motion tenderness UTERUS: gravid and consistent with 10 weeks ADNEXA: no masses palpable and nontender  ASSESSMENT: Normal pregnancy Desires genetic testing  PLAN: Prenatal care See orders   Gunnar BullaJenkins Michelle Tamrah Victorino, CNM

## 2017-01-07 NOTE — Patient Instructions (Signed)
Second Trimester of Pregnancy The second trimester is from week 13 through week 28, month 4 through 6. This is often the time in pregnancy that you feel your best. Often times, morning sickness has lessened or quit. You may have more energy, and you may get hungry more often. Your unborn baby (fetus) is growing rapidly. At the end of the sixth month, he or she is about 9 inches long and weighs about 1 pounds. You will likely feel the baby move (quickening) between 18 and 20 weeks of pregnancy. Follow these instructions at home:  Avoid all smoking, herbs, and alcohol. Avoid drugs not approved by your doctor.  Do not use any tobacco products, including cigarettes, chewing tobacco, and electronic cigarettes. If you need help quitting, ask your doctor. You may get counseling or other support to help you quit.  Only take medicine as told by your doctor. Some medicines are safe and some are not during pregnancy.  Exercise only as told by your doctor. Stop exercising if you start having cramps.  Eat regular, healthy meals.  Wear a good support bra if your breasts are tender.  Do not use hot tubs, steam rooms, or saunas.  Wear your seat belt when driving.  Avoid raw meat, uncooked cheese, and liter boxes and soil used by cats.  Take your prenatal vitamins.  Take 1500-2000 milligrams of calcium daily starting at the 20th week of pregnancy until you deliver your baby.  Try taking medicine that helps you poop (stool softener) as needed, and if your doctor approves. Eat more fiber by eating fresh fruit, vegetables, and whole grains. Drink enough fluids to keep your pee (urine) clear or pale yellow.  Take warm water baths (sitz baths) to soothe pain or discomfort caused by hemorrhoids. Use hemorrhoid cream if your doctor approves.  If you have puffy, bulging veins (varicose veins), wear support hose. Raise (elevate) your feet for 15 minutes, 3-4 times a day. Limit salt in your diet.  Avoid heavy  lifting, wear low heals, and sit up straight.  Rest with your legs raised if you have leg cramps or low back pain.  Visit your dentist if you have not gone during your pregnancy. Use a soft toothbrush to brush your teeth. Be gentle when you floss.  You can have sex (intercourse) unless your doctor tells you not to.  Go to your doctor visits. Get help if:  You feel dizzy.  You have mild cramps or pressure in your lower belly (abdomen).  You have a nagging pain in your belly area.  You continue to feel sick to your stomach (nauseous), throw up (vomit), or have watery poop (diarrhea).  You have bad smelling fluid coming from your vagina.  You have pain with peeing (urination). Get help right away if:  You have a fever.  You are leaking fluid from your vagina.  You have spotting or bleeding from your vagina.  You have severe belly cramping or pain.  You lose or gain weight rapidly.  You have trouble catching your breath and have chest pain.  You notice sudden or extreme puffiness (swelling) of your face, hands, ankles, feet, or legs.  You have not felt the baby move in over an hour.  You have severe headaches that do not go away with medicine.  You have vision changes. This information is not intended to replace advice given to you by your health care provider. Make sure you discuss any questions you have with your health care   provider. Document Released: 01/27/2010 Document Revised: 04/09/2016 Document Reviewed: 01/03/2013 Elsevier Interactive Patient Education  2017 [employer]  

## 2017-05-24 ENCOUNTER — Ambulatory Visit (INDEPENDENT_AMBULATORY_CARE_PROVIDER_SITE_OTHER): Admitting: Certified Nurse Midwife

## 2017-05-24 ENCOUNTER — Encounter: Payer: Self-pay | Admitting: Certified Nurse Midwife

## 2017-05-24 VITALS — BP 112/71 | HR 92 | Wt 149.8 lb

## 2017-05-24 DIAGNOSIS — Z131 Encounter for screening for diabetes mellitus: Secondary | ICD-10-CM

## 2017-05-24 DIAGNOSIS — Z130 Encounter for screening for diseases of the blood and blood-forming organs and certain disorders involving the immune mechanism: Secondary | ICD-10-CM

## 2017-05-24 DIAGNOSIS — Z3482 Encounter for supervision of other normal pregnancy, second trimester: Secondary | ICD-10-CM

## 2017-05-24 DIAGNOSIS — Z23 Encounter for immunization: Secondary | ICD-10-CM | POA: Diagnosis not present

## 2017-05-24 LAB — POCT URINALYSIS DIPSTICK
Bilirubin, UA: NEGATIVE
Blood, UA: NEGATIVE
Glucose, UA: NEGATIVE
KETONES UA: NEGATIVE
LEUKOCYTES UA: NEGATIVE
Nitrite, UA: NEGATIVE
PH UA: 6.5 (ref 5.0–8.0)
PROTEIN UA: NEGATIVE
SPEC GRAV UA: 1.025 (ref 1.010–1.025)
UROBILINOGEN UA: 0.2 U/dL

## 2017-05-24 MED ORDER — TETANUS-DIPHTH-ACELL PERTUSSIS 5-2.5-18.5 LF-MCG/0.5 IM SUSP
0.5000 mL | Freq: Once | INTRAMUSCULAR | Status: AC
  Administered 2017-05-24: 0.5 mL via INTRAMUSCULAR

## 2017-05-24 NOTE — Progress Notes (Addendum)
TRANSFER IN OB HISTORY AND PHYSICAL  SUBJECTIVE:       Alexandra Joseph is a 21 y.o. G1P0 female, Patient's last menstrual period was 10/16/2016 (exact date)., Estimated Date of Delivery: 07/30/17, [redacted]w[redacted]d, presents today for Transition of Prenatal Care. EPIC data migration from outside records is accomplished today.  Complaints today include: occasional nosebleeds.   Denies difficulty breathing or respiratory distress, chest pain, abdominal pain, vaginal bleeding , dysuria, and leg pain or swelling.   Pt relocated to [place] to be with spouse 12/2016. Received care at [facility] in [place]. Returned to [place], [place] last Thursday and plans to stay here through birth of baby.    Gynecologic History  Patient's last menstrual period was 10/16/2016 (exact date).  Contraception: none   Last Pap: N/A.  Obstetric History  OB History  Gravida Para Term Preterm AB Living  1            SAB TAB Ectopic Multiple Live Births               # Outcome Date GA Lbr Len/2nd Weight Sex Delivery Anes PTL Lv  1 Current               Past Medical History:  Diagnosis Date  . Anxiety   . Asthma   . Costochondritis   . Depression   . Menorrhagia   . Migraine   . Tachycardia     Past Surgical History:  Procedure Laterality Date  . ADENOIDECTOMY    . TONSILLECTOMY      Current Outpatient Prescriptions on File Prior to Visit  Medication Sig Dispense Refill  . Prenatal Vit-Fe Fumarate-FA (PRENATAL MULTIVITAMIN) TABS tablet Take 1 tablet by mouth daily at 12 noon.     No current facility-administered medications on file prior to visit.     No Known Allergies  Social History   Social History  . Marital status: Single    Spouse name: N/A  . Number of children: N/A  . Years of education: N/A   Occupational History  . Not on file.   Social History Main Topics  . Smoking status: Never Smoker  . Smokeless tobacco: Never Used  . Alcohol use No  . Drug use: No  . Sexual activity: Yes     Birth control/ protection: None   Other Topics Concern  . Not on file   Social History Narrative  . No narrative on file    Family History  Problem Relation Age of Onset  . Hypertension Mother   . Arrhythmia Mother        palpitations  . Hyperlipidemia Mother   . Heart attack Maternal Uncle 42  . Heart attack Maternal Grandmother   . Heart disease Maternal Grandmother        CABG  . Stroke Maternal Grandmother   . Osteoarthritis Maternal Grandmother   . Hypertension Maternal Grandfather   . Stroke Maternal Grandfather     The following portions of the patient's history were reviewed and updated as appropriate: allergies, current medications, past OB history, past medical history, past surgical history, past family history, past social history, and problem list.    OBJECTIVE: Initial Physical Exam (New OB)  GENERAL APPEARANCE: alert, well appearing, in no apparent distress  HEAD: normocephalic, atraumatic  MOUTH: mucous membranes moist, pharynx normal without lesions and dental hygiene good  THYROID: no thyromegaly or masses present  BREASTS: no masses noted, no significant tenderness, no palpable axillary nodes, no skin changes  LUNGS:  clear to auscultation, no wheezes, rales or rhonchi, symmetric air entry  HEART: regular rate and rhythm, no murmurs  ABDOMEN: soft, nontender, nondistended, no abnormal masses, no epigastric pain, fundus soft, nontender 30 cm and FHT present  EXTREMITIES: no redness or tenderness in the calves or thighs, no edema  SKIN: normal coloration and turgor, no rashes  LYMPH NODES: no adenopathy palpable  NEUROLOGIC: alert, oriented, normal speech, no focal findings or movement disorder noted  PELVIC EXAM not examined  ASSESSMENT: Normal pregnancy Needs 28 week labs  PLAN: Prenatal care Plans breastfeeding and epidural as intrapartum pain management Desires skin to skin and delayed cord clamping Reviewed red flag symptoms and  when to call  RTC ASAP for glucola and CBC RTC x 2 weeks for ROB See orders   Gunnar BullaJenkins Michelle Chan Rosasco, CNM

## 2017-05-24 NOTE — Patient Instructions (Addendum)
Tdap Vaccine (Tetanus, Diphtheria and Pertussis): What You Need to Know 1. Why get vaccinated? Tetanus, diphtheria and pertussis are very serious diseases. Tdap vaccine can protect us from these diseases. And, Tdap vaccine given to pregnant women can protect newborn babies against pertussis. TETANUS (Lockjaw) is rare in the [place] today. It causes painful muscle tightening and stiffness, usually all over the body.  It can lead to tightening of muscles in the head and neck so you can't open your mouth, swallow, or sometimes even breathe. Tetanus kills about 1 out of 10 people who are infected even after receiving the best medical care.  DIPHTHERIA is also rare in the [place] today. It can cause a thick coating to form in the back of the throat.  It can lead to breathing problems, heart failure, paralysis, and death.  PERTUSSIS (Whooping Cough) causes severe coughing spells, which can cause difficulty breathing, vomiting and disturbed sleep.  It can also lead to weight loss, incontinence, and rib fractures. Up to 2 in 100 adolescents and 5 in 100 adults with pertussis are hospitalized or have complications, which could include pneumonia or death.  These diseases are caused by bacteria. Diphtheria and pertussis are spread from person to person through secretions from coughing or sneezing. Tetanus enters the body through cuts, scratches, or wounds. Before vaccines, as many as 200,000 cases of diphtheria, 200,000 cases of pertussis, and hundreds of cases of tetanus, were reported in the [place] each year. Since vaccination began, reports of cases for tetanus and diphtheria have dropped by about 99% and for pertussis by about 80%. 2. Tdap vaccine Tdap vaccine can protect adolescents and adults from tetanus, diphtheria, and pertussis. One dose of Tdap is routinely given at age 11 or 12. People who did not get Tdap at that age should get it as soon as possible. Tdap is especially  important for healthcare professionals and anyone having close contact with a baby younger than 12 months. Pregnant women should get a dose of Tdap during every pregnancy, to protect the newborn from pertussis. Infants are most at risk for severe, life-threatening complications from pertussis. Another vaccine, called Td, protects against tetanus and diphtheria, but not pertussis. A Td booster should be given every 10 years. Tdap may be given as one of these boosters if you have never gotten Tdap before. Tdap may also be given after a severe cut or burn to prevent tetanus infection. Your doctor or the person giving you the vaccine can give you more information. Tdap may safely be given at the same time as other vaccines. 3. Some people should not get this vaccine  A person who has ever had a life-threatening allergic reaction after a previous dose of any diphtheria, tetanus or pertussis containing vaccine, OR has a severe allergy to any part of this vaccine, should not get Tdap vaccine. Tell the person giving the vaccine about any severe allergies.  Anyone who had coma or long repeated seizures within 7 days after a childhood dose of DTP or DTaP, or a previous dose of Tdap, should not get Tdap, unless a cause other than the vaccine was found. They can still get Td.  Talk to your doctor if you: ? have seizures or another nervous system problem, ? had severe pain or swelling after any vaccine containing diphtheria, tetanus or pertussis, ? ever had a condition called Guillain-Barr Syndrome (GBS), ? aren't feeling well on the day the shot is scheduled. 4. Risks With any medicine, including   vaccines, there is a chance of side effects. These are usually mild and go away on their own. Serious reactions are also possible but are rare. Most people who get Tdap vaccine do not have any problems with it. Mild problems following Tdap: (Did not interfere with activities)  Pain where the shot was given (about  3 in 4 adolescents or 2 in 3 adults)  Redness or swelling where the shot was given (about 1 person in 5)  Mild fever of at least 100.4F (up to about 1 in 25 adolescents or 1 in 100 adults)  Headache (about 3 or 4 people in 10)  Tiredness (about 1 person in 3 or 4)  Nausea, vomiting, diarrhea, stomach ache (up to 1 in 4 adolescents or 1 in 10 adults)  Chills, sore joints (about 1 person in 10)  Body aches (about 1 person in 3 or 4)  Rash, swollen glands (uncommon)  Moderate problems following Tdap: (Interfered with activities, but did not require medical attention)  Pain where the shot was given (up to 1 in 5 or 6)  Redness or swelling where the shot was given (up to about 1 in 16 adolescents or 1 in 12 adults)  Fever over 102F (about 1 in 100 adolescents or 1 in 250 adults)  Headache (about 1 in 7 adolescents or 1 in 10 adults)  Nausea, vomiting, diarrhea, stomach ache (up to 1 or 3 people in 100)  Swelling of the entire arm where the shot was given (up to about 1 in 500).  Severe problems following Tdap: (Unable to perform usual activities; required medical attention)  Swelling, severe pain, bleeding and redness in the arm where the shot was given (rare).  Problems that could happen after any vaccine:  People sometimes faint after a medical procedure, including vaccination. Sitting or lying down for about 15 minutes can help prevent fainting, and injuries caused by a fall. Tell your doctor if you feel dizzy, or have vision changes or ringing in the ears.  Some people get severe pain in the shoulder and have difficulty moving the arm where a shot was given. This happens very rarely.  Any medication can cause a severe allergic reaction. Such reactions from a vaccine are very rare, estimated at fewer than 1 in a million doses, and would happen within a few minutes to a few hours after the vaccination. As with any medicine, there is a very remote chance of a vaccine  causing a serious injury or death. The safety of vaccines is always being monitored. For more information, visit: www.cdc.gov/vaccinesafety/ 5. What if there is a serious problem? What should I look for? Look for anything that concerns you, such as signs of a severe allergic reaction, very high fever, or unusual behavior. Signs of a severe allergic reaction can include hives, swelling of the face and throat, difficulty breathing, a fast heartbeat, dizziness, and weakness. These would usually start a few minutes to a few hours after the vaccination. What should I do?  If you think it is a severe allergic reaction or other emergency that can't wait, call 9-1-1 or get the person to the nearest hospital. Otherwise, call your doctor.  Afterward, the reaction should be reported to the Vaccine Adverse Event Reporting System (VAERS). Your doctor might file this report, or you can do it yourself through the VAERS web site at www.vaers.hhs.gov, or by calling 1-800-822-7967. ? VAERS does not give medical advice. 6. The National Vaccine Injury Compensation Program The National   Vaccine Injury Compensation Program (VICP) is a federal program that was created to compensate people who may have been injured by certain vaccines. Persons who believe they may have been injured by a vaccine can learn about the program and about filing a claim by calling 1-(601) 127-3080 or visiting the VICP website at SpiritualWord.at. There is a time limit to file a claim for compensation. 7. How can I learn more?  Ask your doctor. He or she can give you the vaccine package insert or suggest other sources of information.  Call your local or state health department.  Contact the Centers for Disease Control and Prevention (CDC): ? Call 531-595-7181 (1-800-CDC-INFO) or ? Visit CDC's website at PicCapture.uy  Third Trimester of Pregnancy The third trimester is from week 28 through week 40 (months 7 through  9). The third trimester is a time when the unborn baby (fetus) is growing rapidly. At the end of the ninth month, the fetus is about 20 inches in length and weighs 6-10 pounds. Body changes during your third trimester Your body will continue to go through many changes during pregnancy. The changes vary from woman to woman. During the third trimester:  Your weight will continue to increase. You can expect to gain 25-35 pounds (11-16 kg) by the end of the pregnancy.  You may begin to get stretch marks on your hips, abdomen, and breasts.  You may urinate more often because the fetus is moving lower into your pelvis and pressing on your bladder.  You may develop or continue to have heartburn. This is caused by increased hormones that slow down muscles in the digestive tract.  You may develop or continue to have constipation because increased hormones slow digestion and cause the muscles that push waste through your intestines to relax.  You may develop hemorrhoids. These are swollen veins (varicose veins) in the rectum that can itch or be painful.  You may develop swollen, bulging veins (varicose veins) in your legs.  You may have increased body aches in the pelvis, back, or thighs. This is due to weight gain and increased hormones that are relaxing your joints.  You may have changes in your hair. These can include thickening of your hair, rapid growth, and changes in texture. Some women also have hair loss during or after pregnancy, or hair that feels dry or thin. Your hair will most likely return to normal after your baby is born.  Your breasts will continue to grow and they will continue to become tender. A yellow fluid (colostrum) may leak from your breasts. This is the first milk you are producing for your baby.  Your belly button may stick out.  You may notice more swelling in your hands, face, or ankles.  You may have increased tingling or numbness in your hands, arms, and legs. The  skin on your belly may also feel numb.  You may feel short of breath because of your expanding uterus.  You may have more problems sleeping. This can be caused by the size of your belly, increased need to urinate, and an increase in your body's metabolism.  You may notice the fetus "dropping," or moving lower in your abdomen (lightening).  You may have increased vaginal discharge.  You may notice your joints feel loose and you may have pain around your pelvic bone.  What to expect at prenatal visits You will have prenatal exams every 2 weeks until week 36. Then you will have weekly prenatal exams. During a routine prenatal  visit:  You will be weighed to make sure you and the baby are growing normally.  Your blood pressure will be taken.  Your abdomen will be measured to track your baby's growth.  The fetal heartbeat will be listened to.  Any test results from the previous visit will be discussed.  You may have a cervical check near your due date to see if your cervix has softened or thinned (effaced).  You will be tested for Group B streptococcus. This happens between 35 and 37 weeks.  Your health care provider may ask you:  What your birth plan is.  How you are feeling.  If you are feeling the baby move.  If you have had any abnormal symptoms, such as leaking fluid, bleeding, severe headaches, or abdominal cramping.  If you are using any tobacco products, including cigarettes, chewing tobacco, and electronic cigarettes.  If you have any questions.  Other tests or screenings that may be performed during your third trimester include:  Blood tests that check for low iron levels (anemia).  Fetal testing to check the health, activity level, and growth of the fetus. Testing is done if you have certain medical conditions or if there are problems during the pregnancy.  Nonstress test (NST). This test checks the health of your baby to make sure there are no signs of problems,  such as the baby not getting enough oxygen. During this test, a belt is placed around your belly. The baby is made to move, and its heart rate is monitored during movement.  What is false labor? False labor is a condition in which you feel small, irregular tightenings of the muscles in the womb (contractions) that usually go away with rest, changing position, or drinking water. These are called Braxton Hicks contractions. Contractions may last for hours, days, or even weeks before true labor sets in. If contractions come at regular intervals, become more frequent, increase in intensity, or become painful, you should see your health care provider. What are the signs of labor?  Abdominal cramps.  Regular contractions that start at 10 minutes apart and become stronger and more frequent with time.  Contractions that start on the top of the uterus and spread down to the lower abdomen and back.  Increased pelvic pressure and dull back pain.  A watery or bloody mucus discharge that comes from the vagina.  Leaking of amniotic fluid. This is also known as your "water breaking." It could be a slow trickle or a gush. Let your health care provider know if it has a color or strange odor. If you have any of these signs, call your health care provider right away, even if it is before your due date. Follow these instructions at home: Medicines  Follow your health care provider's instructions regarding medicine use. Specific medicines may be either safe or unsafe to take during pregnancy.  Take a prenatal vitamin that contains at least 600 micrograms (mcg) of folic acid.  If you develop constipation, try taking a stool softener if your health care provider approves. Eating and drinking  Eat a balanced diet that includes fresh fruits and vegetables, whole grains, good sources of protein such as meat, eggs, or tofu, and low-fat dairy. Your health care provider will help you determine the amount of weight  gain that is right for you.  Avoid raw meat and uncooked cheese. These carry germs that can cause birth defects in the baby.  If you have low calcium intake from food, talk  to your health care provider about whether you should take a daily calcium supplement.  Eat four or five small meals rather than three large meals a day.  Limit foods that are high in fat and processed sugars, such as fried and sweet foods.  To prevent constipation: ? Drink enough fluid to keep your urine clear or pale yellow. ? Eat foods that are high in fiber, such as fresh fruits and vegetables, whole grains, and beans. Activity  Exercise only as directed by your health care provider. Most women can continue their usual exercise routine during pregnancy. Try to exercise for 30 minutes at least 5 days a week. Stop exercising if you experience uterine contractions.  Avoid heavy lifting.  Do not exercise in extreme heat or humidity, or at high altitudes.  Wear low-heel, comfortable shoes.  Practice good posture.  You may continue to have sex unless your health care provider tells you otherwise. Relieving pain and discomfort  Take frequent breaks and rest with your legs elevated if you have leg cramps or low back pain.  Take warm sitz baths to soothe any pain or discomfort caused by hemorrhoids. Use hemorrhoid cream if your health care provider approves.  Wear a good support bra to prevent discomfort from breast tenderness.  If you develop varicose veins: ? Wear support pantyhose or compression stockings as told by your healthcare provider. ? Elevate your feet for 15 minutes, 3-4 times a day. Prenatal care  Write down your questions. Take them to your prenatal visits.  Keep all your prenatal visits as told by your health care provider. This is important. Safety  Wear your seat belt at all times when driving.  Make a list of emergency phone numbers, including numbers for family, friends, the hospital,  and police and fire departments. General instructions  Avoid cat litter boxes and soil used by cats. These carry germs that can cause birth defects in the baby. If you have a cat, ask someone to clean the litter box for you.  Do not travel far distances unless it is absolutely necessary and only with the approval of your health care provider.  Do not use hot tubs, steam rooms, or saunas.  Do not drink alcohol.  Do not use any products that contain nicotine or tobacco, such as cigarettes and e-cigarettes. If you need help quitting, ask your health care provider.  Do not use any medicinal herbs or unprescribed drugs. These chemicals affect the formation and growth of the baby.  Do not douche or use tampons or scented sanitary pads.  Do not cross your legs for long periods of time.  To prepare for the arrival of your baby: ? Take prenatal classes to understand, practice, and ask questions about labor and delivery. ? Make a trial run to the hospital. ? Visit the hospital and tour the maternity area. ? Arrange for maternity or paternity leave through employers. ? Arrange for family and friends to take care of pets while you are in the hospital. ? Purchase a rear-facing car seat and make sure you know how to install it in your car. ? Pack your hospital bag. ? Prepare the baby's nursery. Make sure to remove all pillows and stuffed animals from the baby's crib to prevent suffocation.  Visit your dentist if you have not gone during your pregnancy. Use a soft toothbrush to brush your teeth and be gentle when you floss. Contact a health care provider if:  You are unsure if you are in  labor or if your water has broken.  You become dizzy.  You have mild pelvic cramps, pelvic pressure, or nagging pain in your abdominal area.  You have lower back pain.  You have persistent nausea, vomiting, or diarrhea.  You have an unusual or bad smelling vaginal discharge.  You have pain when you  urinate. Get help right away if:  Your water breaks before 37 weeks.  You have regular contractions less than 5 minutes apart before 37 weeks.  You have a fever.  You are leaking fluid from your vagina.  You have spotting or bleeding from your vagina.  You have severe abdominal pain or cramping.  You have rapid weight loss or weight gain.  You have shortness of breath with chest pain.  You notice sudden or extreme swelling of your face, hands, ankles, feet, or legs.  Your baby makes fewer than 10 movements in 2 hours.  You have severe headaches that do not go away when you take medicine.  You have vision changes. Summary  The third trimester is from week 28 through week 40, months 7 through 9. The third trimester is a time when the unborn baby (fetus) is growing rapidly.  During the third trimester, your discomfort may increase as you and your baby continue to gain weight. You may have abdominal, leg, and back pain, sleeping problems, and an increased need to urinate.  During the third trimester your breasts will keep growing and they will continue to become tender. A yellow fluid (colostrum) may leak from your breasts. This is the first milk you are producing for your baby.  False labor is a condition in which you feel small, irregular tightenings of the muscles in the womb (contractions) that eventually go away. These are called Braxton Hicks contractions. Contractions may last for hours, days, or even weeks before true labor sets in.  Signs of labor can include: abdominal cramps; regular contractions that start at 10 minutes apart and become stronger and more frequent with time; watery or bloody mucus discharge that comes from the vagina; increased pelvic pressure and dull back pain; and leaking of amniotic fluid. This information is not intended to replace advice given to you by your health care provider. Make sure you discuss any questions you have with your health care  provider. Document Released: 10/27/2001 Document Revised: 04/09/2016 Document Reviewed: 01/03/2013 Elsevier Interactive Patient Education  2017 [employer].  Breastfeeding Deciding to breastfeed is one of the best choices you can make for you and your baby. A change in hormones during pregnancy causes your breast tissue to grow and increases the number and size of your milk ducts. These hormones also allow proteins, sugars, and fats from your blood supply to make breast milk in your milk-producing glands. Hormones prevent breast milk from being released before your baby is born as well as prompt milk flow after birth. Once breastfeeding has begun, thoughts of your baby, as well as his or her sucking or crying, can stimulate the release of milk from your milk-producing glands. Benefits of breastfeeding For Your Baby  Your first milk (colostrum) helps your baby's digestive system function better.  There are antibodies in your milk that help your baby fight off infections.  Your baby has a lower incidence of asthma, allergies, and sudden infant death syndrome.  The nutrients in breast milk are better for your baby than infant formulas and are designed uniquely for your baby's needs.  Breast milk improves your baby's brain development.  Your baby is less likely to develop other conditions, such as childhood obesity, asthma, or type 2 diabetes mellitus.  For You  Breastfeeding helps to create a very special bond between you and your baby.  Breastfeeding is convenient. Breast milk is always available at the correct temperature and costs nothing.  Breastfeeding helps to burn calories and helps you lose the weight gained during pregnancy.  Breastfeeding makes your uterus contract to its prepregnancy size faster and slows bleeding (lochia) after you give birth.  Breastfeeding helps to lower your risk of developing type 2 diabetes mellitus, osteoporosis, and breast or ovarian cancer later in  life.  Signs that your baby is hungry Early Signs of Hunger  Increased alertness or activity.  Stretching.  Movement of the head from side to side.  Movement of the head and opening of the mouth when the corner of the mouth or cheek is stroked (rooting).  Increased sucking sounds, smacking lips, cooing, sighing, or squeaking.  Hand-to-mouth movements.  Increased sucking of fingers or hands.  Late Signs of Hunger  Fussing.  Intermittent crying.  Extreme Signs of Hunger Signs of extreme hunger will require calming and consoling before your baby will be able to breastfeed successfully. Do not wait for the following signs of extreme hunger to occur before you initiate breastfeeding:  Restlessness.  A loud, strong cry.  Screaming.  Breastfeeding basics Breastfeeding Initiation  Find a comfortable place to sit or lie down, with your neck and back well supported.  Place a pillow or rolled up blanket under your baby to bring him or her to the level of your breast (if you are seated). Nursing pillows are specially designed to help support your arms and your baby while you breastfeed.  Make sure that your baby's abdomen is facing your abdomen.  Gently massage your breast. With your fingertips, massage from your chest wall toward your nipple in a circular motion. This encourages milk flow. You may need to continue this action during the feeding if your milk flows slowly.  Support your breast with 4 fingers underneath and your thumb above your nipple. Make sure your fingers are well away from your nipple and your baby's mouth.  Stroke your baby's lips gently with your finger or nipple.  When your baby's mouth is open wide enough, quickly bring your baby to your breast, placing your entire nipple and as much of the colored area around your nipple (areola) as possible into your baby's mouth. ? More areola should be visible above your baby's upper lip than below the lower  lip. ? Your baby's tongue should be between his or her lower gum and your breast.  Ensure that your baby's mouth is correctly positioned around your nipple (latched). Your baby's lips should create a seal on your breast and be turned out (everted).  It is common for your baby to suck about 2-3 minutes in order to start the flow of breast milk.  Latching Teaching your baby how to latch on to your breast properly is very important. An improper latch can cause nipple pain and decreased milk supply for you and poor weight gain in your baby. Also, if your baby is not latched onto your nipple properly, he or she may swallow some air during feeding. This can make your baby fussy. Burping your baby when you switch breasts during the feeding can help to get rid of the air. However, teaching your baby to latch on properly is still the best way to  prevent fussiness from swallowing air while breastfeeding. Signs that your baby has successfully latched on to your nipple:  Silent tugging or silent sucking, without causing you pain.  Swallowing heard between every 3-4 sucks.  Muscle movement above and in front of his or her ears while sucking.  Signs that your baby has not successfully latched on to nipple:  Sucking sounds or smacking sounds from your baby while breastfeeding.  Nipple pain.  If you think your baby has not latched on correctly, slip your finger into the corner of your baby's mouth to break the suction and place it between your baby's gums. Attempt breastfeeding initiation again. Signs of Successful Breastfeeding Signs from your baby:  A gradual decrease in the number of sucks or complete cessation of sucking.  Falling asleep.  Relaxation of his or her body.  Retention of a small amount of milk in his or her mouth.  Letting go of your breast by himself or herself.  Signs from you:  Breasts that have increased in firmness, weight, and size 1-3 hours after feeding.  Breasts  that are softer immediately after breastfeeding.  Increased milk volume, as well as a change in milk consistency and color by the fifth day of breastfeeding.  Nipples that are not sore, cracked, or bleeding.  Signs That Your Pecola Leisure is Getting Enough Milk  Wetting at least 1-2 diapers during the first 24 hours after birth.  Wetting at least 5-6 diapers every 24 hours for the first week after birth. The urine should be clear or pale yellow by 5 days after birth.  Wetting 6-8 diapers every 24 hours as your baby continues to grow and develop.  At least 3 stools in a 24-hour period by age 690 days. The stool should be soft and yellow.  At least 3 stools in a 24-hour period by age 16 days. The stool should be seedy and yellow.  No loss of weight greater than 10% of birth weight during the first 68 days of age.  Average weight gain of 4-7 ounces (113-198 g) per week after age 31 days.  Consistent daily weight gain by age 690 days, without weight loss after the age of 2 weeks.  After a feeding, your baby may spit up a small amount. This is common. Breastfeeding frequency and duration Frequent feeding will help you make more milk and can prevent sore nipples and breast engorgement. Breastfeed when you feel the need to reduce the fullness of your breasts or when your baby shows signs of hunger. This is called "breastfeeding on demand." Avoid introducing a pacifier to your baby while you are working to establish breastfeeding (the first 4-6 weeks after your baby is born). After this time you may choose to use a pacifier. Research has shown that pacifier use during the first year of a baby's life decreases the risk of sudden infant death syndrome (SIDS). Allow your baby to feed on each breast as long as he or she wants. Breastfeed until your baby is finished feeding. When your baby unlatches or falls asleep while feeding from the first breast, offer the second breast. Because newborns are often sleepy in the  first few weeks of life, you may need to awaken your baby to get him or her to feed. Breastfeeding times will vary from baby to baby. However, the following rules can serve as a guide to help you ensure that your baby is properly fed:  Newborns (babies 57 weeks of age or younger) may breastfeed  every 1-3 hours.  Newborns should not go longer than 3 hours during the day or 5 hours during the night without breastfeeding.  You should breastfeed your baby a minimum of 8 times in a 24-hour period until you begin to introduce solid foods to your baby at around 10 months of age.  Breast milk pumping Pumping and storing breast milk allows you to ensure that your baby is exclusively fed your breast milk, even at times when you are unable to breastfeed. This is especially important if you are going back to work while you are still breastfeeding or when you are not able to be present during feedings. Your lactation consultant can give you guidelines on how long it is safe to store breast milk. A breast pump is a machine that allows you to pump milk from your breast into a sterile bottle. The pumped breast milk can then be stored in a refrigerator or freezer. Some breast pumps are operated by hand, while others use electricity. Ask your lactation consultant which type will work best for you. Breast pumps can be purchased, but some hospitals and breastfeeding support groups lease breast pumps on a monthly basis. A lactation consultant can teach you how to hand express breast milk, if you prefer not to use a pump. Caring for your breasts while you breastfeed Nipples can become dry, cracked, and sore while breastfeeding. The following recommendations can help keep your breasts moisturized and healthy:  Avoid using soap on your nipples.  Wear a supportive bra. Although not required, special nursing bras and tank tops are designed to allow access to your breasts for breastfeeding without taking off your entire bra or  top. Avoid wearing underwire-style bras or extremely tight bras.  Air dry your nipples for 3-41minutes after each feeding.  Use only cotton bra pads to absorb leaked breast milk. Leaking of breast milk between feedings is normal.  Use lanolin on your nipples after breastfeeding. Lanolin helps to maintain your skin's normal moisture barrier. If you use pure lanolin, you do not need to wash it off before feeding your baby again. Pure lanolin is not toxic to your baby. You may also hand express a few drops of breast milk and gently massage that milk into your nipples and allow the milk to air dry.  In the first few weeks after giving birth, some women experience extremely full breasts (engorgement). Engorgement can make your breasts feel heavy, warm, and tender to the touch. Engorgement peaks within 3-5 days after you give birth. The following recommendations can help ease engorgement:  Completely empty your breasts while breastfeeding or pumping. You may want to start by applying warm, moist heat (in the shower or with warm water-soaked hand towels) just before feeding or pumping. This increases circulation and helps the milk flow. If your baby does not completely empty your breasts while breastfeeding, pump any extra milk after he or she is finished.  Wear a snug bra (nursing or regular) or tank top for 1-2 days to signal your body to slightly decrease milk production.  Apply ice packs to your breasts, unless this is too uncomfortable for you.  Make sure that your baby is latched on and positioned properly while breastfeeding.  If engorgement persists after 48 hours of following these recommendations, contact your health care provider or a Advertising copywriter. Overall health care recommendations while breastfeeding  Eat healthy foods. Alternate between meals and snacks, eating 3 of each per day. Because what you eat affects your  breast milk, some of the foods may make your baby more irritable  than usual. Avoid eating these foods if you are sure that they are negatively affecting your baby.  Drink milk, fruit juice, and water to satisfy your thirst (about 10 glasses a day).  Rest often, relax, and continue to take your prenatal vitamins to prevent fatigue, stress, and anemia.  Continue breast self-awareness checks.  Avoid chewing and smoking tobacco. Chemicals from cigarettes that pass into breast milk and exposure to secondhand smoke may harm your baby.  Avoid alcohol and drug use, including marijuana. Some medicines that may be harmful to your baby can pass through breast milk. It is important to ask your health care provider before taking any medicine, including all over-the-counter and prescription medicine as well as vitamin and herbal supplements. It is possible to become pregnant while breastfeeding. If birth control is desired, ask your health care provider about options that will be safe for your baby. Contact a health care provider if:  You feel like you want to stop breastfeeding or have become frustrated with breastfeeding.  You have painful breasts or nipples.  Your nipples are cracked or bleeding.  Your breasts are red, tender, or warm.  You have a swollen area on either breast.  You have a fever or chills.  You have nausea or vomiting.  You have drainage other than breast milk from your nipples.  Your breasts do not become full before feedings by the fifth day after you give birth.  You feel sad and depressed.  Your baby is too sleepy to eat well.  Your baby is having trouble sleeping.  Your baby is wetting less than 3 diapers in a 24-hour period.  Your baby has less than 3 stools in a 24-hour period.  Your baby's skin or the white part of his or her eyes becomes yellow.  Your baby is not gaining weight by 39 days of age. Get help right away if:  Your baby is overly tired (lethargic) and does not want to wake up and feed.  Your baby develops  an unexplained fever. This information is not intended to replace advice given to you by your health care provider. Make sure you discuss any questions you have with your health care provider. Document Released: 11/02/2005 Document Revised: 04/15/2016 Document Reviewed: 04/26/2013 Elsevier Interactive Patient Education  [address] [place] Class  April 28, 2017  Wednesday 7:00p - 9:00p  [facility] [place], [place]  June 02, 2017  Wednesday 7:00p - 9:00p [facility] [place], [place]    July 07, 2017   Wednesday 7:00p - 9:000p [facility] [place], [place]  August 04, 2017  Wednesday  7:00p - 9:00p [facility] [place], [place]  September 01, 2017 Wednesday 7:00p - 9:00p [facility] [place], [place]  Interested in a waterbirth?  This informational class will help you discover whether waterbirth is the right fit for you.  Education about waterbirth itself, supplies you would need and how to assemble your support team is what you can expect from this class.  Some obstetrical practices require this class in order to pursue a waterbirth.  (Not all obstetrical practices offer waterbirth check with your healthcare provider)  Register only the expectant mom, but you are encouraged to bring your partner to class!  Fees & Payment No fee  Register Online [email or web address] Search [facility] 2018 Prenatal Education Class Schedule Register at [email or web address] in  the Classes & Resources Link or call [employer] Line at [phone] 9:00a-5:00p M-F  Childbirth Preparation Certified Childbirth Educators teach this 5 week course.  Expectant parents are encouraged to take this class in their 3rd trimester, completing it by their 35-36th week. Meets in [facility], Lower Level.  Mondays Thursdays  7:00-9:00 p 7:00-9:00 p  July 23 -  August 20 July 19 - August 16  September 17 - October 15 September 6 -October 4  November 5 - December 3 October 25 - November 29   No Class on Thanksgiving Day -November 22  Childbirth Preparation Refresher Course For those who have previously attended Prepared Childbirth Preparation classes, this class in incorporated into the 3rd and 4th classes in the Monday night childbirth series.  Course meets in the [facility]. Lower Level from 7:00p - 9:00p  August 6 & 13  October 1 & 8  November 19 & 26   Weekend Childbirth [employer] [employer] Classes are held Saturday & Sunday, 1:00 5:00p Course meets in [facility], [place]  August 4 & 5  November 3 & 4    The [address] [facility] Free tours are held on the third Sunday of each month at 3 pm.  The tour meets in the third floor waiting area and will take approximately 30 minutes.  Tours are also included in Childbirth class series as well as Brother/Sister class.  An online virtual tour can be seen at [email or web address].         Breastfeeding & Infant Nutrition The course incorporates returning to work or school.  Breast milk collection and storage with basic breastfeeding and infant nutrition. This two-class course is held the 2nd and 3rd Tuesday of each month from 7:00 -9:00 pm.  Course meets in the [facility] Lower level  June 12 & 19 July-No Class  August 14 & 21 September 11 &18  September 11 & 18 October 9 &16  November 13 & 20 December 11 & 18   [facility] [facility] welcomes any mother for a social outing with other Moms to share experiences and challenges in an informal setting.  Meets the 1st Thursday and 3rd Thursday 11:30a-1:00 pm of each month in [facility] 3rd floor classroom.  No registration required.  Newborn Essentials This course covers bathing, diapering, swaddling and more with practice on lifelike dolls.  Participants will also learn safety tips and infant CPR (Not for certification).   It is held the 1st Wednesday of each month from 7:00p-9:00p in the [facility] Education Center, Lower level.  June 6 July- No Class  August 1 September 5  October 3 November 7  December 7    Preparing Big Brother & Sister This one session course prepares children and their parents for the arrival of a new baby.  It is held on the 1st Tuesday of each month from 6:30p - 8:00p. Course meets in the [facility] Education Center, Lower level.  July-No Class August 7  September 4 October 2  November 6 December 4   [place] [facility] [employer] Dads This nationally acclaimed class helps expecting and new dads with the basic skills and confidence to bond with their infants, support their mates, and provide a safe and healthy home environment for their new family. Classes are held the 2nd Saturday of every month from 9:00a - 12:00 noon.  Course meets in the [facility] Lower level.  June 9 August 11  October 13 No Class in December  Cord Blood Banking Information Cord blood banking is the process of collecting and storing the blood that is in the umbilical cord and placenta at the time of delivery. This blood contains stem cells, which can be used to treat many blood diseases, immune system disorders, and childhood cancers. Stem cells can also be used to research certain diseases and treatments. Many people who choose cord blood banking donate the blood. Donated blood can be used in lifesaving treatments or for research. Other people choose to store the blood privately. Blood that is stored privately can only be used with the person's permission. This option is often chosen if:  A family member needs a stem cell transplant.  The child is part of an ethnic minority.  The child was conceived through in vitro fertilization.  What should I look for in a blood bank? A blood bank is the organization that coordinates cord blood banking. Make sure the cord blood bank that you use:  Is accredited.  Is financially  stable.  Handles a large volume of cord blood samples.  Has a procedure in place for transport and storage.  Allows you the option of transferring your cord blood sample.  Has a procedure in place if the bank goes out of business.  Clearly states all costs and limits to future costs.  People who choose to donate cord blood should not need to pay for blood banking. People who keep the blood for private use will need to pay for the first (initial) storage and pay a fee each year (annual fee). Other fees may also apply. What are the risks of cord blood banking? There are no health risks associated with cord blood banking. It is considered safe. How should I prepare? You must schedule this process at least 4-6 weeks before you will be giving birth. How is the blood collected? The blood is collected as soon as the baby has been delivered. Within 15 minutes of delivery, a health care provider will take these actions to collect the blood:  Clamp the umbilical cord at the top and bottom. This traps the blood in the umbilical cord.  Use a syringe or bag to collect the blood.  Insert needles into the placenta to collect (draw out) more blood.  What happens after the blood is collected? After the blood has been collected:  The blood will be sent to a blood bank.  The blood will be tested for genetic problems and infectious diseases. If the blood tests positive for a genetic problem or a disease, someone will contact you and let you know.  The blood will be frozen.  If your child develops a genetic condition, immune system disorder, or cancer, you will be responsible for contacting the blood bank and letting them know. This information is not intended to replace advice given to you by your health care provider. Make sure you discuss any questions you have with your health care provider. Document Released: 04/22/2010 Document Revised: 04/09/2016 Document Reviewed: 04/22/2015 Elsevier  Interactive Patient Education  2018 [employer]. CDC Tdap Vaccine VIS (01/09/14) This information is not intended to replace advice given to you by your health care provider. Make sure you discuss any questions you have with your health care provider. Document Released: 05/03/2012 Document Revised: 07/23/2016 Document Reviewed: 07/23/2016 Elsevier Interactive Patient Education  2017 [employer].

## 2017-05-24 NOTE — Progress Notes (Signed)
ROB- last seen in [place] on 04/26/2017. Tdap, BTC, and needs GTT.

## 2017-05-25 ENCOUNTER — Other Ambulatory Visit

## 2017-05-26 LAB — CBC
Hematocrit: 31.6 % — ABNORMAL LOW (ref 34.0–46.6)
Hemoglobin: 10.1 g/dL — ABNORMAL LOW (ref 11.1–15.9)
MCH: 28.6 pg (ref 26.6–33.0)
MCHC: 32 g/dL (ref 31.5–35.7)
MCV: 90 fL (ref 79–97)
PLATELETS: 146 10*3/uL — AB (ref 150–379)
RBC: 3.53 x10E6/uL — AB (ref 3.77–5.28)
RDW: 13.5 % (ref 12.3–15.4)
WBC: 12 10*3/uL — ABNORMAL HIGH (ref 3.4–10.8)

## 2017-05-26 LAB — GLUCOSE, 1 HOUR GESTATIONAL: Gestational Diabetes Screen: 82 mg/dL (ref 65–139)

## 2017-06-04 ENCOUNTER — Ambulatory Visit (INDEPENDENT_AMBULATORY_CARE_PROVIDER_SITE_OTHER): Admitting: Certified Nurse Midwife

## 2017-06-04 VITALS — BP 125/57 | Wt 152.0 lb

## 2017-06-04 DIAGNOSIS — M549 Dorsalgia, unspecified: Secondary | ICD-10-CM

## 2017-06-04 DIAGNOSIS — R519 Headache, unspecified: Secondary | ICD-10-CM

## 2017-06-04 DIAGNOSIS — O26893 Other specified pregnancy related conditions, third trimester: Secondary | ICD-10-CM

## 2017-06-04 DIAGNOSIS — R51 Headache: Secondary | ICD-10-CM

## 2017-06-04 DIAGNOSIS — O99891 Other specified diseases and conditions complicating pregnancy: Secondary | ICD-10-CM

## 2017-06-04 DIAGNOSIS — R102 Pelvic and perineal pain: Secondary | ICD-10-CM

## 2017-06-04 DIAGNOSIS — O9989 Other specified diseases and conditions complicating pregnancy, childbirth and the puerperium: Secondary | ICD-10-CM

## 2017-06-04 LAB — POCT URINALYSIS DIPSTICK
BILIRUBIN UA: NEGATIVE
Glucose, UA: NEGATIVE
Ketones, UA: NEGATIVE
LEUKOCYTES UA: NEGATIVE
NITRITE UA: NEGATIVE
PH UA: 7.5 (ref 5.0–8.0)
PROTEIN UA: NEGATIVE
Spec Grav, UA: 1.01 (ref 1.010–1.025)
UROBILINOGEN UA: 0.2 U/dL

## 2017-06-04 NOTE — Patient Instructions (Signed)
Preventing Preterm Birth °Preterm birth is when your baby is delivered between 20 weeks and 37 weeks of pregnancy. A full-term pregnancy lasts for at least 37 weeks. Preterm birth can be dangerous for your baby because the last few weeks of pregnancy are an important time for your baby's brain and lungs to grow. Many things can cause a baby to be born early. Sometimes the cause is not known. There are certain factors that make you more likely to experience preterm birth, such as: °· Having a previous baby born preterm. °· Being pregnant with twins or other multiples. °· Having had fertility treatment. °· Being overweight or underweight at the start of your pregnancy. °· Having any of the following during pregnancy: °? An infection, including a urinary tract infection (UTI) or an STI (sexually transmitted infection). °? High blood pressure. °? Diabetes. °? Vaginal bleeding. °· Being age 35 or older. °· Being age 18 or younger. °· Getting pregnant within 6 months of a previous pregnancy. °· Suffering extreme stress or physical or emotional abuse during pregnancy. °· Standing for long periods of time during pregnancy, such as working at a job that requires standing. ° °What are the risks? °The most serious risk of preterm birth is that the baby may not survive. This is more likely to happen if a baby is born before 34 weeks. Other risks and complications of preterm birth may include your baby having: °· Breathing problems. °· Brain damage that affects movement and coordination (cerebral palsy). °· Feeding difficulties. °· Vision or hearing problems. °· Infections or inflammation of the digestive tract (colitis). °· Developmental delays. °· Learning disabilities. °· Higher risk for diabetes, heart disease, and high blood pressure later in life. ° °What can I do to lower my risk? °Medical care ° °The most important thing you can do to lower your risk for preterm birth is to get routine medical care during pregnancy  (prenatal care). If you have a high risk of preterm birth, you may be referred to a health care provider who specializes in managing high-risk pregnancies (perinatologist). You may be given medicine to help prevent preterm birth. °Lifestyle changes °Certain lifestyle changes can also lower your risk of preterm birth: °· Wait at least 6 months after a pregnancy to become pregnant again. °· Try to plan pregnancy for when you are between 19 and 35 years old. °· Get to a healthy weight before getting pregnant. If you are overweight, work with your health care provider to safely lose weight. °· Do not use any products that contain nicotine or tobacco, such as cigarettes and e-cigarettes. If you need help quitting, ask your health care provider. °· Do not drink alcohol. °· Do not use drugs. ° °Where to find support: °For more support, consider: °· Talking with your health care provider. °· Talking with a therapist or substance abuse counselor, if you need help quitting. °· Working with a diet and nutrition specialist (dietitian) or a personal trainer to maintain a healthy weight. °· Joining a support group. ° °Where to find more information: °Learn more about preventing preterm birth from: °· Centers for Disease Control and Prevention: cdc.gov/reproductivehealth/maternalinfanthealth/pretermbirth.htm °· March of Dimes: marchofdimes.org/complications/premature-babies.aspx °· American Pregnancy Association: americanpregnancy.org/labor-and-birth/premature-labor ° °Contact a health care provider if: °· You have any of the following signs of preterm labor before 37 weeks: °? A change or increase in vaginal discharge. °? Fluid leaking from your vagina. °? Pressure or cramps in your lower abdomen. °? A backache that does not   go away or gets worse. °? Regular tightening (contractions) in your lower abdomen. °Summary °· Preterm birth means having your baby during weeks 20-37 of pregnancy. °· Preterm birth may put your baby at risk  for physical and mental problems. °· Getting good prenatal care can help prevent preterm birth. °· You can lower your risk of preterm birth by making certain lifestyle changes, such as not smoking and not using alcohol. °This information is not intended to replace advice given to you by your health care provider. Make sure you discuss any questions you have with your health care provider. °Document Released: 12/17/2015 Document Revised: 07/11/2016 Document Reviewed: 07/11/2016 °Elsevier Interactive Patient Education © 2018 Elsevier Inc. ° °

## 2017-06-05 LAB — CBC
HEMATOCRIT: 31.4 % — AB (ref 34.0–46.6)
HEMOGLOBIN: 10.5 g/dL — AB (ref 11.1–15.9)
MCH: 28.7 pg (ref 26.6–33.0)
MCHC: 33.4 g/dL (ref 31.5–35.7)
MCV: 86 fL (ref 79–97)
Platelets: 154 10*3/uL (ref 150–379)
RBC: 3.66 x10E6/uL — ABNORMAL LOW (ref 3.77–5.28)
RDW: 13 % (ref 12.3–15.4)
WBC: 11.2 10*3/uL — AB (ref 3.4–10.8)

## 2017-06-05 LAB — URIC ACID: URIC ACID: 3.8 mg/dL (ref 2.5–7.1)

## 2017-06-05 LAB — COMPREHENSIVE METABOLIC PANEL
ALBUMIN: 3.6 g/dL (ref 3.5–5.5)
ALK PHOS: 107 IU/L (ref 39–117)
ALT: 6 IU/L (ref 0–32)
AST: 14 IU/L (ref 0–40)
Albumin/Globulin Ratio: 1.5 (ref 1.2–2.2)
BUN/Creatinine Ratio: 8 — ABNORMAL LOW (ref 9–23)
BUN: 3 mg/dL — AB (ref 6–20)
Bilirubin Total: 0.3 mg/dL (ref 0.0–1.2)
CALCIUM: 9.2 mg/dL (ref 8.7–10.2)
CO2: 21 mmol/L (ref 20–29)
CREATININE: 0.4 mg/dL — AB (ref 0.57–1.00)
Chloride: 105 mmol/L (ref 96–106)
GFR calc Af Amer: 175 mL/min/{1.73_m2} (ref >59)
GFR, EST NON AFRICAN AMERICAN: 151 mL/min/{1.73_m2} (ref >59)
GLOBULIN, TOTAL: 2.4 g/dL (ref 1.5–4.5)
Glucose: 77 mg/dL (ref 65–99)
Potassium: 4.4 mmol/L (ref 3.5–5.2)
SODIUM: 140 mmol/L (ref 134–144)
Total Protein: 6 g/dL (ref 6.0–8.5)

## 2017-06-05 LAB — PROTEIN / CREATININE RATIO, URINE
Creatinine, Urine: 89.1 mg/dL
PROTEIN/CREAT RATIO: 146 mg/g{creat} (ref 0–200)
Protein, Ur: 13 mg/dL

## 2017-06-05 LAB — FETAL FIBRONECTIN: FETAL FIBRONECTIN: NEGATIVE

## 2017-06-06 LAB — URINE CULTURE: ORGANISM ID, BACTERIA: NO GROWTH

## 2017-06-08 ENCOUNTER — Ambulatory Visit (INDEPENDENT_AMBULATORY_CARE_PROVIDER_SITE_OTHER): Admitting: Obstetrics and Gynecology

## 2017-06-08 VITALS — BP 112/60 | HR 99 | Wt 154.0 lb

## 2017-06-08 DIAGNOSIS — Z3493 Encounter for supervision of normal pregnancy, unspecified, third trimester: Secondary | ICD-10-CM

## 2017-06-08 LAB — POCT URINALYSIS DIPSTICK
Bilirubin, UA: NEGATIVE
Blood, UA: NEGATIVE
GLUCOSE UA: NEGATIVE
KETONES UA: NEGATIVE
Leukocytes, UA: NEGATIVE
Nitrite, UA: NEGATIVE
Protein, UA: NEGATIVE
SPEC GRAV UA: 1.01 (ref 1.010–1.025)
Urobilinogen, UA: 0.2 E.U./dL
pH, UA: 7 (ref 5.0–8.0)

## 2017-06-08 NOTE — Progress Notes (Signed)
ROB- pt is having a lot of pelvic pressure 

## 2017-06-08 NOTE — Progress Notes (Signed)
ROB-doing well, reminded to enroll in classes.

## 2017-06-11 LAB — NUSWAB VAGINITIS PLUS (VG+)
CANDIDA ALBICANS, NAA: NEGATIVE
Candida glabrata, NAA: NEGATIVE
Chlamydia trachomatis, NAA: NEGATIVE
NEISSERIA GONORRHOEAE, NAA: NEGATIVE
Trich vag by NAA: NEGATIVE

## 2017-06-11 NOTE — Progress Notes (Signed)
Subjective:   Alexandra Joseph is a 21 y.o. G1P0 7228w0d being seen today for work in problem visit.    Patient reports intermittent headache, lower abdominal pain and pressure that increased with standing for the last 24 hours, pain 8-9/10. No attempts to relief with home treatment measures.  Denies contractions, vaginal bleeding or leaking of fluid.  Reports good fetal movement.  The following portions of the patient's history were reviewed and updated as appropriate: allergies, current medications, past family history, past medical history, past social history, past surgical history and problem list.   Review of systems:   ROS negative except as noted above. Information obtained from pt and mother.   Objective:   BP (!) 125/57   Wt 152 lb (68.9 kg)   LMP 10/16/2016 (Exact Date)   BMI 29.69 kg/m   FHT: Fetal Heart Rate (bpm): 140  Uterine Size: Fundal Height: 32 cm  Fetal Movement: Movement: Present    Abdomen:  soft, gravid, appropriate for gestational age,non-tender  Vaginal:  Discharge, clearish white    Cervix: Visually closed, FFN/NuSwab collected   Assessment:   Pregnancy:  G1P0 at 2435w0d  1. Pregnancy headache in third trimester  - Protein / creatinine ratio, urine - Uric acid - CBC - Comprehensive metabolic panel - POCT urinalysis dipstick  2. Back pain affecting pregnancy in third trimester  - Urine Culture - Fetal fibronectin - NuSwab Vaginitis Plus (VG+) - POCT urinalysis dipstick  3. Pelvic pain affecting pregnancy in third trimester, antepartum  - Urine Culture - Fetal fibronectin - NuSwab Vaginitis Plus (VG+) - POCT urinalysis dipstick  Plan:   FFN and NuSwab collected, will contact pt via MyChart with results.  Discussed round ligament pain and home treatment measures including abdominal support.   Preterm labor symptoms: vaginal bleeding, contractions and leaking of fluid reviewed in detail.  Fetal movement precautions reviewed.  Follow  up in 1 week as previously scheduled.   Gunnar BullaJenkins Michelle Kyren Vaux, CNM

## 2017-06-21 DIAGNOSIS — Z3A34 34 weeks gestation of pregnancy: Secondary | ICD-10-CM | POA: Insufficient documentation

## 2017-06-21 DIAGNOSIS — O4703 False labor before 37 completed weeks of gestation, third trimester: Principal | ICD-10-CM | POA: Insufficient documentation

## 2017-06-22 ENCOUNTER — Observation Stay
Admission: EM | Admit: 2017-06-22 | Discharge: 2017-06-22 | Disposition: A | Discharge status: Home / Self Care | Payer: Medicaid Other | Attending: Obstetrics and Gynecology | Admitting: Obstetrics and Gynecology

## 2017-06-22 ENCOUNTER — Encounter: Payer: Self-pay | Admitting: *Deleted

## 2017-06-22 ENCOUNTER — Encounter: Payer: Self-pay | Admitting: Certified Nurse Midwife

## 2017-06-22 ENCOUNTER — Ambulatory Visit (INDEPENDENT_AMBULATORY_CARE_PROVIDER_SITE_OTHER): Admitting: Certified Nurse Midwife

## 2017-06-22 VITALS — BP 103/67 | HR 97 | Wt 157.3 lb

## 2017-06-22 DIAGNOSIS — O99019 Anemia complicating pregnancy, unspecified trimester: Secondary | ICD-10-CM

## 2017-06-22 DIAGNOSIS — Z3A34 34 weeks gestation of pregnancy: Secondary | ICD-10-CM

## 2017-06-22 DIAGNOSIS — Z3493 Encounter for supervision of normal pregnancy, unspecified, third trimester: Secondary | ICD-10-CM

## 2017-06-22 DIAGNOSIS — O4703 False labor before 37 completed weeks of gestation, third trimester: Secondary | ICD-10-CM | POA: Diagnosis not present

## 2017-06-22 LAB — POCT URINALYSIS DIPSTICK
BILIRUBIN UA: NEGATIVE
GLUCOSE UA: NEGATIVE
Ketones, UA: NEGATIVE
Nitrite, UA: NEGATIVE
Protein, UA: NEGATIVE
RBC UA: NEGATIVE
SPEC GRAV UA: 1.015 (ref 1.010–1.025)
UROBILINOGEN UA: 0.2 U/dL
pH, UA: 7.5 (ref 5.0–8.0)

## 2017-06-22 LAB — ROM PLUS (ARMC ONLY): ROM PLUS: NEGATIVE

## 2017-06-22 MED ORDER — ACETAMINOPHEN 325 MG PO TABS: 650.0000 mg | ORAL_TABLET | ORAL | Status: DC | PRN

## 2017-06-22 MED ORDER — CALCIUM CARBONATE ANTACID 500 MG PO CHEW: 2.0000 | CHEWABLE_TABLET | ORAL | Status: DC | PRN

## 2017-06-22 NOTE — Progress Notes (Signed)
   L&D OB Triage Note  SUBJECTIVE Alexandra Joseph is a 21 y.o. G1P0 female at 4155w4d, EDD Estimated Date of Delivery: 07/30/17 who presented to triage with complaints of leaking fluid x 1 yesterday and once about an 1 hr ago. Endorses good fetal movement.   Obstetric History   G1   P0   T0   P0   A0   L0    SAB0   TAB0   Ectopic0   Multiple0   Live Births0     # Outcome Date GA Lbr Len/2nd Weight Sex Delivery Anes PTL Lv  1 Current               No prescriptions prior to admission.     OBJECTIVE  Nursing Evaluation:   BP 123/68 (BP Location: Left Arm)   Pulse (!) 101   Temp 98.1 F (36.7 C) (Oral)   Resp 16   Ht 5' (1.524 m)   Wt 153 lb (69.4 kg)   LMP 10/16/2016 (Exact Date)   BMI 29.88 kg/m     NST was performed and has been reviewed by me.  NST INTERPRETATION: Category I   ROM Plus: negative Nitrazine negative  Mode: External Baseline Rate (A): 150 bpm Variability: Moderate Accelerations: 15 x 15 Decelerations: None     Contraction Frequency (min): none ASSESSMENT Impression:  1. Pregnancy:  G1P0 at 3455w4d , EDD Estimated Date of Delivery: 07/30/17 2.  reactive  PLAN 1. Reassurance given 2. Discharge home with precautions to return to L&D or call the office if:  increased leakage or fluid, contractions more than  6 per  1 hour, decreased fetal movement, bleeding from vaginal area or bleeding that is more than spotting  3. Continue routine prenatal care.

## 2017-06-22 NOTE — Progress Notes (Signed)
ROB- lower back pain and pelvic cramps. Seen in ed last nite.

## 2017-06-22 NOTE — Patient Instructions (Signed)

## 2017-06-22 NOTE — OB Triage Note (Signed)
Recvd pt from ED. Pt c/o leaking of fluid once yesterday morning and once an about an hour ago (both times after using the restroom). Pt states her lower back has been hurting more than usual and took tylenol around 1300 yesterday afternoon. Rates back pain a 7 out of 10. Pt states she has not had intercourse in the past 24 hours. Pt states she is well hydrated and is feeling baby boy move well.

## 2017-06-22 NOTE — Discharge Instructions (Signed)
Come back if: ° °Big gush of fluids °Heavy vaginal bleeding °Decreased fetal movement °Temp over 100.4 °Contractions every 3-5 min lasting at least one hour ° °Stay well hydrated and get plenty of rest! °

## 2017-06-24 NOTE — Progress Notes (Signed)
ROB-Pt doing well. Seen in ED last night for possible PPROM, ruled out. Anticipatory guidance regarding 36 week cultures and course of prenatal care. Taking iron OTC for anemia, will recheck CBC next visit. Reviewed red flag symptoms and when to call. RTC x 2 weeks for 36 week cultures, repeat CBC, and ROB.

## 2017-06-30 ENCOUNTER — Ambulatory Visit (INDEPENDENT_AMBULATORY_CARE_PROVIDER_SITE_OTHER): Admitting: Certified Nurse Midwife

## 2017-06-30 ENCOUNTER — Telehealth: Payer: Self-pay | Admitting: Certified Nurse Midwife

## 2017-06-30 ENCOUNTER — Encounter: Payer: Self-pay | Admitting: Certified Nurse Midwife

## 2017-06-30 ENCOUNTER — Other Ambulatory Visit: Payer: Self-pay | Admitting: Certified Nurse Midwife

## 2017-06-30 VITALS — BP 105/66 | HR 85 | Wt 158.1 lb

## 2017-06-30 DIAGNOSIS — Z3493 Encounter for supervision of normal pregnancy, unspecified, third trimester: Secondary | ICD-10-CM | POA: Diagnosis not present

## 2017-06-30 DIAGNOSIS — R102 Pelvic and perineal pain: Principal | ICD-10-CM

## 2017-06-30 DIAGNOSIS — O26893 Other specified pregnancy related conditions, third trimester: Secondary | ICD-10-CM

## 2017-06-30 DIAGNOSIS — O368130 Decreased fetal movements, third trimester, not applicable or unspecified: Secondary | ICD-10-CM | POA: Diagnosis not present

## 2017-06-30 LAB — POCT URINALYSIS DIPSTICK
BILIRUBIN UA: NEGATIVE
GLUCOSE UA: NEGATIVE
KETONES UA: NEGATIVE
LEUKOCYTES UA: NEGATIVE
Nitrite, UA: NEGATIVE
Protein, UA: NEGATIVE
RBC UA: NEGATIVE
Spec Grav, UA: 1.01 (ref 1.010–1.025)
Urobilinogen, UA: 0.2 E.U./dL
pH, UA: 7 (ref 5.0–8.0)

## 2017-06-30 NOTE — Progress Notes (Signed)
PT presents today for decreased fetal movement, feeling nausea, diarrhea, and abdominal pain. She states that she was at a party recently and could have potential been exposed to a virus. She is tolerating PO. Discussed use of imodium, increased PO hydration. NST today- Reactive. Baseline- 135, accelerations present, no decelerations, moderate variability . Toco irritability. Abdomen palpate soft.  Pt  states that she is still not feeling movement. Dicussed u/s for BPP to check on baby. NST today reassuring. Red flag symptoms reviewed. Follow up 1-2 days for BPP then as scheduled for ROB.   Alexandra BurkeAnnie Dilyn Joseph, CNM

## 2017-06-30 NOTE — Patient Instructions (Signed)

## 2017-06-30 NOTE — Telephone Encounter (Signed)
Past couple days Alexandra Joseph has been having pain and tightness in her stomach - yesterday was worse. Every 5-6 minutes consistent

## 2017-06-30 NOTE — Telephone Encounter (Signed)
Left message to contact office.Cell number. Called home number and spoke with pt. Mother had to wake pt up to talk with me. Complains of abdominal since Monday evening and is getting worse. Pt denies s/s of UTI. Pt will come in to office to be seen today.

## 2017-07-06 ENCOUNTER — Encounter: Admitting: Certified Nurse Midwife

## 2017-07-07 ENCOUNTER — Ambulatory Visit (INDEPENDENT_AMBULATORY_CARE_PROVIDER_SITE_OTHER)

## 2017-07-07 ENCOUNTER — Ambulatory Visit (INDEPENDENT_AMBULATORY_CARE_PROVIDER_SITE_OTHER): Admitting: Certified Nurse Midwife

## 2017-07-07 ENCOUNTER — Encounter: Payer: Self-pay | Admitting: Certified Nurse Midwife

## 2017-07-07 VITALS — BP 101/60 | HR 95 | Wt 162.4 lb

## 2017-07-07 DIAGNOSIS — O26893 Other specified pregnancy related conditions, third trimester: Secondary | ICD-10-CM | POA: Diagnosis not present

## 2017-07-07 DIAGNOSIS — R102 Pelvic and perineal pain: Secondary | ICD-10-CM

## 2017-07-07 DIAGNOSIS — Z3493 Encounter for supervision of normal pregnancy, unspecified, third trimester: Secondary | ICD-10-CM

## 2017-07-07 DIAGNOSIS — Z113 Encounter for screening for infections with a predominantly sexual mode of transmission: Secondary | ICD-10-CM

## 2017-07-07 DIAGNOSIS — Z369 Encounter for antenatal screening, unspecified: Secondary | ICD-10-CM

## 2017-07-07 LAB — POCT URINALYSIS DIPSTICK
Bilirubin, UA: NEGATIVE
Blood, UA: NEGATIVE
Glucose, UA: NEGATIVE
Ketones, UA: NEGATIVE
Leukocytes, UA: NEGATIVE
NITRITE UA: NEGATIVE
Protein, UA: NEGATIVE
SPEC GRAV UA: 1.015 (ref 1.010–1.025)
UROBILINOGEN UA: 0.2 U/dL
pH, UA: 6 (ref 5.0–8.0)

## 2017-07-07 NOTE — Patient Instructions (Signed)

## 2017-07-07 NOTE — Progress Notes (Signed)
ROB today, she states  that she has still felt the same amount of movement. BPP today 8/8 with Anterior, grade 3, highly calcified.  Appearing. Discussed plan of care with Melody and plan on twice weekly NST and repeat ultrsound in 2 wks. Plan for induction around 39 wks. Fetal movement discussed. Encouraged 2 x daily monitoring. GBS and cultures done today. Will follow up with results. Pt to return on Friday for NST. Tuesday 8/28 ROB & NST then Friday 8/31 for NST. She agrees to plan. Labor precautions reviewed.   [name], CNM  ULTRASOUND REPORT  Location: [facility] Date of Service: 07/07/17  Indications:  BPP and Growth Findings:  Singleton intrauterine pregnancy is visualized with FHR at 145 BPM. Biometrics give an (U/S) Gestational age of [redacted] weeks 4 days, and an (U/S) EDD of 07/24/17; this correlates with the clinically established EDD of 07/23/17.  Fetal presentation is vertex, spine left lateral.  EFW: 3172 grams ( 7 lbs.  0 oz) 48th percentile, Williams. Placenta: Anterior, grade 3, highly calcified appearing. AFI: Adequate for gestational age at 10.0 cm  Anatomic survey of the fetal stomach, bladder, kidneys and lateral ventricle appear WNL. Gender - Female.  BPP: 8/8 with good fetal breathing movements, tone, gross movements and AFI noted.    Impression: 1. EFW: 3172 grams ( 7 lbs. 0 oz.) 48th percentile, Williams. 2. AFI: Adequate for gestational age at 10.0 cm. 3. BPP: 8/8. 4. Placenta grade 3, highly calcified.  Recommendations: 1.Clinical correlation with the patient's History and Physical Exam.   Revonda Humphrey, RDMS, RVT

## 2017-07-09 ENCOUNTER — Ambulatory Visit (INDEPENDENT_AMBULATORY_CARE_PROVIDER_SITE_OTHER): Admitting: Certified Nurse Midwife

## 2017-07-09 ENCOUNTER — Encounter: Payer: Self-pay | Admitting: Certified Nurse Midwife

## 2017-07-09 ENCOUNTER — Other Ambulatory Visit

## 2017-07-09 VITALS — BP 120/77 | HR 88 | Wt 162.3 lb

## 2017-07-09 DIAGNOSIS — Z3483 Encounter for supervision of other normal pregnancy, third trimester: Secondary | ICD-10-CM

## 2017-07-09 LAB — POCT URINALYSIS DIPSTICK
BILIRUBIN UA: NEGATIVE
Blood, UA: NEGATIVE
Glucose, UA: NEGATIVE
KETONES UA: NEGATIVE
Leukocytes, UA: NEGATIVE
NITRITE UA: NEGATIVE
PH UA: 6 (ref 5.0–8.0)
PROTEIN UA: NEGATIVE
Spec Grav, UA: 1.015 (ref 1.010–1.025)
Urobilinogen, UA: 0.2 E.U./dL

## 2017-07-09 LAB — GC/CHLAMYDIA PROBE AMP
CHLAMYDIA, DNA PROBE: NEGATIVE
NEISSERIA GONORRHOEAE BY PCR: NEGATIVE

## 2017-07-09 LAB — STREP GP B NAA: STREP GROUP B AG: NEGATIVE

## 2017-07-09 NOTE — Patient Instructions (Signed)
Nonstress Test The nonstress test is a procedure that monitors the fetus's heartbeat. The test will monitor the heartbeat when the fetus is at rest and while the fetus is moving. In a healthy fetus, there will be an increase in fetal heart rate when the fetus moves or kicks. The heart rate will decrease at rest. This test helps determine if the fetus is healthy. Your health care provider will look at a number of patterns in the heart rate tracing to make sure your baby is thriving. If there is concern, your health care provider may order additional tests or may suggest another course of action. This test is often done in the third trimester and can help determine if an early delivery is needed and safe. Common reasons to have this test are:  You are past your due date.  You have a high-risk pregnancy.  You are feeling less movement than normal.  You have lost a pregnancy in the past.  Your health care provider suspects fetal growth problems.  You have too much or too little amniotic fluid.  What happens before the procedure?  Eat a meal right before the test or as directed by your health care provider. Food may help stimulate fetal movements.  Use the restroom right before the test. What happens during the procedure?  Two belts will be placed around your abdomen. These belts have monitors attached to them. One records the fetal heart rate and the other records uterine contractions.  You may be asked to lie down on your side or to stay sitting upright.  You may be given a button to press when you feel movement.  The fetal heartbeat is listened to and watched on a screen. The heartbeat is recorded on a sheet of paper.  If the fetus seems to be sleeping, you may be asked to drink some juice or soda, gently press your abdomen, or make some noise to wake the fetus. What happens after the procedure? Your health care provider will discuss the test results with you and make recommendations  for the near future.  This information is not intended to replace advice given to you by your health care provider. Make sure you discuss any questions you have with your health care provider. This information is not intended to replace advice given to you by your health care provider. Make sure you discuss any questions you have with your health care provider. Document Released: 10/23/2002 Document Revised: 10/02/2016 Document Reviewed: 12/06/2012 Elsevier Interactive Patient Education  2018 Elsevier Inc.  

## 2017-07-09 NOTE — Progress Notes (Signed)
NST performed today was reviewed and was found to be reactive. Baseline 140 with Moderate variability; No decels noted. No contractions  Continue recommended antenatal testing and prenatal care.  [name], CNM

## 2017-07-13 ENCOUNTER — Other Ambulatory Visit (INDEPENDENT_AMBULATORY_CARE_PROVIDER_SITE_OTHER)

## 2017-07-13 ENCOUNTER — Other Ambulatory Visit: Payer: Self-pay | Admitting: Certified Nurse Midwife

## 2017-07-13 ENCOUNTER — Ambulatory Visit (INDEPENDENT_AMBULATORY_CARE_PROVIDER_SITE_OTHER): Admitting: Certified Nurse Midwife

## 2017-07-13 ENCOUNTER — Other Ambulatory Visit

## 2017-07-13 VITALS — BP 133/56 | HR 89 | Wt 162.8 lb

## 2017-07-13 DIAGNOSIS — O368130 Decreased fetal movements, third trimester, not applicable or unspecified: Secondary | ICD-10-CM | POA: Diagnosis not present

## 2017-07-13 DIAGNOSIS — Z3A37 37 weeks gestation of pregnancy: Secondary | ICD-10-CM

## 2017-07-13 DIAGNOSIS — Z3483 Encounter for supervision of other normal pregnancy, third trimester: Secondary | ICD-10-CM

## 2017-07-13 LAB — POCT URINALYSIS DIPSTICK
BILIRUBIN UA: NEGATIVE
Blood, UA: NEGATIVE
GLUCOSE UA: NEGATIVE
Ketones, UA: NEGATIVE
NITRITE UA: NEGATIVE
PH UA: 6 (ref 5.0–8.0)
Protein, UA: NEGATIVE
Spec Grav, UA: 1.01 (ref 1.010–1.025)
Urobilinogen, UA: 0.2 E.U./dL

## 2017-07-13 NOTE — Patient Instructions (Signed)

## 2017-07-13 NOTE — Progress Notes (Signed)
ROB & NST, BPP. Pt doing well, continues to state she does not feel fetal movement well. NST today reactive. Baseline 135, moderate variability, accelerations present, no decelerations present. No contractions. BPP today with reactive NST 10/10. She will return on Friday for NST, then next Wednesday for repeat u/s to look at placenta then ROB with Melody. Pt agrees to plan of care. Labor precautions reviewed.   [name], CNM

## 2017-07-16 ENCOUNTER — Encounter: Payer: Self-pay | Admitting: Certified Nurse Midwife

## 2017-07-16 ENCOUNTER — Ambulatory Visit (INDEPENDENT_AMBULATORY_CARE_PROVIDER_SITE_OTHER): Admitting: Certified Nurse Midwife

## 2017-07-16 ENCOUNTER — Other Ambulatory Visit: Payer: Self-pay | Admitting: Certified Nurse Midwife

## 2017-07-16 ENCOUNTER — Other Ambulatory Visit

## 2017-07-16 VITALS — BP 116/68 | HR 104 | Wt 168.0 lb

## 2017-07-16 DIAGNOSIS — Z3A37 37 weeks gestation of pregnancy: Secondary | ICD-10-CM

## 2017-07-16 DIAGNOSIS — O368130 Decreased fetal movements, third trimester, not applicable or unspecified: Secondary | ICD-10-CM | POA: Diagnosis not present

## 2017-07-16 DIAGNOSIS — O43103 Malformation of placenta, unspecified, third trimester: Secondary | ICD-10-CM

## 2017-07-16 DIAGNOSIS — Z3493 Encounter for supervision of normal pregnancy, unspecified, third trimester: Secondary | ICD-10-CM | POA: Diagnosis not present

## 2017-07-16 LAB — POCT URINALYSIS DIPSTICK
BILIRUBIN UA: NEGATIVE
GLUCOSE UA: NEGATIVE
Ketones, UA: NEGATIVE
Nitrite, UA: NEGATIVE
Protein, UA: NEGATIVE
RBC UA: NEGATIVE
SPEC GRAV UA: 1.015 (ref 1.010–1.025)
UROBILINOGEN UA: 0.2 U/dL
pH, UA: 7.5 (ref 5.0–8.0)

## 2017-07-16 NOTE — Progress Notes (Signed)
NST only. NONSTRESS TEST INTERPRETATION  INDICATIONS: Decreased fetal movement, grade 3 placenta  FHR baseline: 140s RESULTS:Reactive COMMENTS:    PLAN: 1. Continue fetal kick counts twice a day. 2. Continue antepartum testing as scheduled-Biweekly 3.  Darol Destinerystal Alayja Armas, CMA

## 2017-07-16 NOTE — Patient Instructions (Signed)
Nonstress Test The nonstress test is a procedure that monitors the fetus's heartbeat. The test will monitor the heartbeat when the fetus is at rest and while the fetus is moving. In a healthy fetus, there will be an increase in fetal heart rate when the fetus moves or kicks. The heart rate will decrease at rest. This test helps determine if the fetus is healthy. Your health care provider will look at a number of patterns in the heart rate tracing to make sure your baby is thriving. If there is concern, your health care provider may order additional tests or may suggest another course of action. This test is often done in the third trimester and can help determine if an early delivery is needed and safe. Common reasons to have this test are:  You are past your due date.  You have a high-risk pregnancy.  You are feeling less movement than normal.  You have lost a pregnancy in the past.  Your health care provider suspects fetal growth problems.  You have too much or too little amniotic fluid.  What happens before the procedure?  Eat a meal right before the test or as directed by your health care provider. Food may help stimulate fetal movements.  Use the restroom right before the test. What happens during the procedure?  Two belts will be placed around your abdomen. These belts have monitors attached to them. One records the fetal heart rate and the other records uterine contractions.  You may be asked to lie down on your side or to stay sitting upright.  You may be given a button to press when you feel movement.  The fetal heartbeat is listened to and watched on a screen. The heartbeat is recorded on a sheet of paper.  If the fetus seems to be sleeping, you may be asked to drink some juice or soda, gently press your abdomen, or make some noise to wake the fetus. What happens after the procedure? Your health care provider will discuss the test results with you and make recommendations  for the near future.  This information is not intended to replace advice given to you by your health care provider. Make sure you discuss any questions you have with your health care provider. This information is not intended to replace advice given to you by your health care provider. Make sure you discuss any questions you have with your health care provider. Document Released: 10/23/2002 Document Revised: 10/02/2016 Document Reviewed: 12/06/2012 Elsevier Interactive Patient Education  2018 Elsevier Inc.  

## 2017-07-16 NOTE — Progress Notes (Signed)
PT present for antenatal testing: NST NST performed today was reviewed and was found to be reactive. Baseline 140 with Moderate variability; No decels noted.  Continue recommended antenatal testing and prenatal care.  Doreene BurkeAnnie Shyann Hefner, CNM

## 2017-07-18 ENCOUNTER — Observation Stay
Admission: EM | Admit: 2017-07-18 | Discharge: 2017-07-19 | Disposition: A | Discharge status: Home / Self Care | Payer: Medicaid Other | Attending: Obstetrics and Gynecology | Admitting: Obstetrics and Gynecology

## 2017-07-18 DIAGNOSIS — O368130 Decreased fetal movements, third trimester, not applicable or unspecified: Secondary | ICD-10-CM

## 2017-07-18 DIAGNOSIS — Z3A38 38 weeks gestation of pregnancy: Secondary | ICD-10-CM | POA: Diagnosis not present

## 2017-07-18 MED ORDER — ACETAMINOPHEN 325 MG PO TABS
650.0000 mg | ORAL_TABLET | ORAL | Status: DC | PRN
  Administered 2017-07-18: 650 mg via ORAL
  Filled 2017-07-18: qty 2

## 2017-07-18 MED ORDER — CALCIUM CARBONATE ANTACID 500 MG PO CHEW: 2.0000 | CHEWABLE_TABLET | ORAL | Status: DC | PRN

## 2017-07-18 NOTE — OB Triage Note (Signed)
Patient arrived in triage with complaints of "pains in back and stomach" and "pressure" all afternoon, with increase in abdominal pain after fall that occurred at approx 1930 this evening. Pt. States she tripped over her dog and is uncertain of whether or not she hit her belly. Denies vaginal bleeding. States decrease in fetal movement since fall.  Admits to small amount of watery mucous discharge, uncertain of leaking of fluid. Abdomen palpated soft. EFM explained and applied.

## 2017-07-18 NOTE — Progress Notes (Addendum)
L&D OB Triage Note  SUBJECTIVE Alexandra Joseph is a 21 y.o. G1P0 female at 3310w2d, EDD Estimated Date of Delivery: 07/30/17 who presented to triage with complaints of a fall that occurred at approximately 2000. Pt states that she fell to her side, hitting the side of her abdomen. She denies LOF, vaginal bleeding , and contractions. She states that her side is tender. She has not taken anything for pain. She does not feel fetal movement well due to anterior placenta.   Obstetric History   G1   P0   T0   P0   A0   L0    SAB0   TAB0   Ectopic0   Multiple0   Live Births0     # Outcome Date GA Lbr Len/2nd Weight Sex Delivery Anes PTL Lv  1 Current               Prescriptions Prior to Admission  Medication Sig Dispense Refill Last Dose  . ferrous sulfate 325 (65 FE) MG tablet Take 325 mg by mouth daily with breakfast.   07/18/2017 at 1800  . Prenatal Vit-Fe Fumarate-FA (PRENATAL MULTIVITAMIN) TABS tablet Take 1 tablet by mouth daily at 12 noon.   07/17/2017 at 2100  . acetaminophen (TYLENOL) 500 MG tablet Take 500 mg by mouth every 6 (six) hours as needed.   Taking     OBJECTIVE  Nursing Evaluation:   BP 116/63   Pulse (!) 109   Temp 98.7 F (37.1 C) (Oral)   Resp 18   LMP 10/16/2016 (Exact Date)    Findings:  Reassuring monitoring tracing   NST was performed and has been reviewed by me.  NST INTERPRETATION: Category I  Mode: External Baseline Rate (A): 135 bpm Variability: Moderate Accelerations: 15 x 15 Decelerations: None     Contraction Frequency (min): uterine irritability   SVE: 1 cm , high, thick. No leaking or fluid seen with exam.  ASSESSMENT Impression:  1. Pregnancy:  G1P0 at 4910w2d , EDD Estimated Date of Delivery: 07/30/17 2.  Reactive NST 3. Status post fall 4. No abrasions or bruising on abdomen, it is not tender to touch.   PLAN 1. Reassurance given, prolonged monitoring x 4 hrs 2. Discharge home with precautions to return to L&D or call the office if:   increased leakage or fluid, contractions more than  6 per  1 hour, decreased fetal movement, persistent low back pain or cramping or bleeding from vaginal area  3. Continue routine prenatal care.

## 2017-07-19 DIAGNOSIS — O368130 Decreased fetal movements, third trimester, not applicable or unspecified: Secondary | ICD-10-CM | POA: Diagnosis not present

## 2017-07-19 NOTE — OB Triage Note (Signed)
Patient given discharge instructions, verbalized understanding.

## 2017-07-20 ENCOUNTER — Ambulatory Visit (INDEPENDENT_AMBULATORY_CARE_PROVIDER_SITE_OTHER): Admitting: Certified Nurse Midwife

## 2017-07-20 ENCOUNTER — Other Ambulatory Visit

## 2017-07-20 VITALS — BP 144/60 | HR 101 | Wt 165.8 lb

## 2017-07-20 DIAGNOSIS — Z3493 Encounter for supervision of normal pregnancy, unspecified, third trimester: Secondary | ICD-10-CM

## 2017-07-20 LAB — POCT URINALYSIS DIPSTICK
Bilirubin, UA: NEGATIVE
GLUCOSE UA: NEGATIVE
Ketones, UA: NEGATIVE
Leukocytes, UA: NEGATIVE
NITRITE UA: NEGATIVE
Protein, UA: NEGATIVE
RBC UA: NEGATIVE
Spec Grav, UA: 1.015 (ref 1.010–1.025)
UROBILINOGEN UA: 0.2 U/dL
pH, UA: 6 (ref 5.0–8.0)

## 2017-07-20 NOTE — Patient Instructions (Signed)

## 2017-07-20 NOTE — Progress Notes (Signed)
ROB, doing well. She states that she is having irritability. Fetal movement is unchanged. NST reactive Basline 130 Variability: moderate Accelerations: present Decelerations: absent  Toco: irritability present  Continue present management of antenatal testing. Induction scheduled for 07/26/17 @ 0500. [facility]   Doreene BurkeAnnie Geriann Lafont, CNM

## 2017-07-21 ENCOUNTER — Ambulatory Visit (INDEPENDENT_AMBULATORY_CARE_PROVIDER_SITE_OTHER): Admitting: Obstetrics and Gynecology

## 2017-07-21 ENCOUNTER — Inpatient Hospital Stay
Admission: EM | Admit: 2017-07-21 | Discharge: 2017-07-25 | DRG: 767 | Disposition: A | Discharge status: Home / Self Care | Source: Ambulatory Visit | Attending: Obstetrics and Gynecology | Admitting: Obstetrics and Gynecology

## 2017-07-21 ENCOUNTER — Encounter: Payer: Self-pay | Admitting: *Deleted

## 2017-07-21 ENCOUNTER — Ambulatory Visit (INDEPENDENT_AMBULATORY_CARE_PROVIDER_SITE_OTHER)

## 2017-07-21 VITALS — BP 133/78 | HR 102 | Wt 165.4 lb

## 2017-07-21 DIAGNOSIS — Z3A38 38 weeks gestation of pregnancy: Secondary | ICD-10-CM

## 2017-07-21 DIAGNOSIS — O4103X0 Oligohydramnios, third trimester, not applicable or unspecified: Secondary | ICD-10-CM

## 2017-07-21 DIAGNOSIS — O368130 Decreased fetal movements, third trimester, not applicable or unspecified: Secondary | ICD-10-CM | POA: Diagnosis present

## 2017-07-21 DIAGNOSIS — Z3A37 37 weeks gestation of pregnancy: Secondary | ICD-10-CM

## 2017-07-21 DIAGNOSIS — O43103 Malformation of placenta, unspecified, third trimester: Secondary | ICD-10-CM

## 2017-07-21 DIAGNOSIS — Z3493 Encounter for supervision of normal pregnancy, unspecified, third trimester: Secondary | ICD-10-CM

## 2017-07-21 DIAGNOSIS — O4100X0 Oligohydramnios, unspecified trimester, not applicable or unspecified: Secondary | ICD-10-CM

## 2017-07-21 HISTORY — DX: Anemia, unspecified: D64.9

## 2017-07-21 LAB — CBC
HEMATOCRIT: 33.7 % — AB (ref 35.0–47.0)
Hemoglobin: 11.7 g/dL — ABNORMAL LOW (ref 12.0–16.0)
MCH: 30.2 pg (ref 26.0–34.0)
MCHC: 34.7 g/dL (ref 32.0–36.0)
MCV: 87 fL (ref 80.0–100.0)
PLATELETS: 116 10*3/uL — AB (ref 150–440)
RBC: 3.87 MIL/uL (ref 3.80–5.20)
RDW: 17.4 % — AB (ref 11.5–14.5)
WBC: 11.8 10*3/uL — AB (ref 3.6–11.0)

## 2017-07-21 LAB — TYPE AND SCREEN
ABO/RH(D): A POS
ANTIBODY SCREEN: NEGATIVE

## 2017-07-21 LAB — POCT URINALYSIS DIPSTICK
BILIRUBIN UA: NEGATIVE
Blood, UA: NEGATIVE
Glucose, UA: NEGATIVE
Ketones, UA: NEGATIVE
LEUKOCYTES UA: NEGATIVE
NITRITE UA: NEGATIVE
PH UA: 7 (ref 5.0–8.0)
Protein, UA: NEGATIVE
Spec Grav, UA: 1.01 (ref 1.010–1.025)
UROBILINOGEN UA: 0.2 U/dL

## 2017-07-21 MED ORDER — FENTANYL CITRATE (PF) 100 MCG/2ML IJ SOLN
50.0000 ug | INTRAMUSCULAR | Status: DC | PRN
  Administered 2017-07-22: 50 ug via INTRAVENOUS
  Administered 2017-07-22 (×6): 100 ug via INTRAVENOUS
  Filled 2017-07-21 (×7): qty 2

## 2017-07-21 MED ORDER — LACTATED RINGERS IV SOLN
INTRAVENOUS | Status: DC
  Administered 2017-07-21 – 2017-07-22 (×3): via INTRAVENOUS

## 2017-07-21 MED ORDER — MISOPROSTOL 25 MCG QUARTER TABLET
50.0000 ug | ORAL_TABLET | Freq: Four times a day (QID) | ORAL | Status: DC
  Administered 2017-07-21 – 2017-07-22 (×2): 50 ug via VAGINAL
  Filled 2017-07-21 (×2): qty 2

## 2017-07-21 MED ORDER — TERBUTALINE SULFATE 1 MG/ML IJ SOLN: 0.2500 mg | Freq: Once | INTRAMUSCULAR | Status: DC | PRN

## 2017-07-21 MED ORDER — OXYTOCIN 40 UNITS IN LACTATED RINGERS INFUSION - SIMPLE MED
1.0000 m[IU]/min | INTRAVENOUS | Status: DC
  Administered 2017-07-22: 1 m[IU]/min via INTRAVENOUS
  Filled 2017-07-21: qty 1000

## 2017-07-21 MED ORDER — ACETAMINOPHEN 325 MG PO TABS
650.0000 mg | ORAL_TABLET | ORAL | Status: DC | PRN
  Administered 2017-07-23: 650 mg via ORAL
  Administered 2017-07-23: 325 mg via ORAL
  Filled 2017-07-21 (×2): qty 2

## 2017-07-21 MED ORDER — OXYTOCIN BOLUS FROM INFUSION
500.0000 mL | Freq: Once | INTRAVENOUS | Status: AC
  Administered 2017-07-23: 500 mL via INTRAVENOUS

## 2017-07-21 MED ORDER — LACTATED RINGERS IV SOLN
500.0000 mL | INTRAVENOUS | Status: DC | PRN
  Administered 2017-07-22: 500 mL via INTRAVENOUS

## 2017-07-21 MED ORDER — SOD CITRATE-CITRIC ACID 500-334 MG/5ML PO SOLN: 30.0000 mL | ORAL | Status: DC | PRN

## 2017-07-21 MED ORDER — ONDANSETRON HCL 4 MG/2ML IJ SOLN
4.0000 mg | Freq: Four times a day (QID) | INTRAMUSCULAR | Status: DC | PRN
  Administered 2017-07-22: 4 mg via INTRAVENOUS
  Filled 2017-07-21: qty 2

## 2017-07-21 MED ORDER — OXYTOCIN 40 UNITS IN LACTATED RINGERS INFUSION - SIMPLE MED
2.5000 [IU]/h | INTRAVENOUS | Status: DC
  Administered 2017-07-23: 2.5 [IU]/h via INTRAVENOUS

## 2017-07-21 MED ORDER — LIDOCAINE HCL (PF) 1 % IJ SOLN
30.0000 mL | INTRAMUSCULAR | Status: DC | PRN
  Administered 2017-07-23: 30 mL via SUBCUTANEOUS

## 2017-07-21 NOTE — H&P (Signed)
Obstetric History and Physical  Alexandra Joseph is a 21 y.o. G1P0 with IUP at [redacted]w[redacted]d presenting with oligohydramnious and grade 3 placenta for IOL. Patient states she has been having  none contractions, minimal vaginal bleeding, intact membranes, with decreased  fetal movement.    Prenatal Course Source of Care: [facility]  Pregnancy complications or risks:oligohydramnious  Prenatal labs and studies: ABO, Rh: A/Positive/-- (02/07 1043) Antibody: Negative (02/07 1043) Rubella: 1.97 (02/07 1043) RPR: Non Reactive (02/07 1043)  HBsAg: Negative (02/07 1043)  HIV:   neg WUJ:[id number] (08/22 1530) 1 hr Glucola  normal Genetic screening normal Anatomy US normal  Past Medical History:  Diagnosis Date  . Anemia   . Anxiety   . Costochondritis   . Depression   . Menorrhagia   . Migraine   . Tachycardia     Past Surgical History:  Procedure Laterality Date  . ADENOIDECTOMY    . TONSILLECTOMY      OB History  Gravida Para Term Preterm AB Living  1            SAB TAB Ectopic Multiple Live Births               # Outcome Date GA Lbr Len/2nd Weight Sex Delivery Anes PTL Lv  1 Current               Social History   Social History  . Marital status: Single    Spouse name: N/A  . Number of children: N/A  . Years of education: N/A   Social History Main Topics  . Smoking status: Never Smoker  . Smokeless tobacco: Never Used  . Alcohol use No  . Drug use: No  . Sexual activity: Yes    Birth control/ protection: None   Other Topics Concern  . None   Social History Narrative  . None    Family History  Problem Relation Age of Onset  . Hypertension Mother   . Arrhythmia Mother        palpitations  . Hyperlipidemia Mother   . Heart attack Maternal Uncle 42  . Heart attack Maternal Grandmother   . Heart disease Maternal Grandmother        CABG  . Stroke Maternal Grandmother   . Osteoarthritis Maternal Grandmother   . Hypertension Maternal Grandfather   . Stroke  Maternal Grandfather     Prescriptions Prior to Admission  Medication Sig Dispense Refill Last Dose  . acetaminophen (TYLENOL) 500 MG tablet Take 500 mg by mouth every 6 (six) hours as needed.   Past Week at Unknown time  . ferrous sulfate 325 (65 FE) MG tablet Take 325 mg by mouth daily with breakfast.   07/21/2017 at Unknown time  . Prenatal Vit-Fe Fumarate-FA (PRENATAL MULTIVITAMIN) TABS tablet Take 1 tablet by mouth daily at 12 noon.   07/20/2017 at Unknown time    No Known Allergies  Review of Systems: Negative except for what is mentioned in HPI.  Physical Exam: BP 119/70 (BP Location: Left Arm)   Pulse 95   Temp 98.9 F (37.2 C) (Oral)   Resp 18   Ht 5' (1.524 m)   Wt 165 lb (74.8 kg)   LMP 10/16/2016 (Exact Date)   BMI 32.22 kg/m  GENERAL: Well-developed, well-nourished female in no acute distress. anxious LUNGS: Clear to auscultation bilaterally.  HEART: Regular rate and rhythm. ABDOMEN: Soft, nontender, nondistended, gravid. EXTREMITIES: Nontender,mild non-pitting edema, 2+ distal pulses. Cervical Exam:  1/40/-2 with negative fern/ntz in  office earlier FHT:  Baseline rate 135 bpm   Variability moderate  Accelerations present   Decelerations none Contractions: rare   Pertinent Labs/Studies:   Results for orders placed or performed in visit on 07/21/17 (from the past 24 hour(s))  POCT urinalysis dipstick     Status: None   Collection Time: 07/21/17  2:06 PM  Result Value Ref Range   Color, UA dark yellow    Clarity, UA cloudy    Glucose, UA neg    Bilirubin, UA neg    Ketones, UA neg    Spec Grav, UA 1.010 1.010 - 1.025   Blood, UA neg    pH, UA 7.0 5.0 - 8.0   Protein, UA neg    Urobilinogen, UA 0.2 0.2 or 1.0 E.U./dL   Nitrite, UA neg    Leukocytes, UA Negative Negative    Assessment : [name] is a 21 y.o. G1P0 at 8556w5d being admitted for labor.  Plan: Labor: Expectant management.  Induction/Augmentation as needed, per protocol FWB: Reassuring  fetal heart tracing.  GBS negative Delivery plan: Hopeful for vaginal delivery  Darienne Belleau, CNM [facility]

## 2017-07-21 NOTE — Progress Notes (Signed)
ROB- pt had growth scan today, fluid is low

## 2017-07-21 NOTE — Progress Notes (Signed)
ROB Indications:growth Findings:  Mason JimSingleton intrauterine pregnancy is visualized with FHR at 144 BPM. Biometrics give an (U/S) Gestational age of 20 4/7 weeks and an (U/S) EDD of 07/24/17; this correlates with the clinically established EDD of 07/30/17.  Fetal presentation is Vertex.  EFW: 3805g (8lb 6oz) Williams 81percentile. Placenta: Anterior, grade 3, remote to cervix. AFI: 5.4 cm (oligohydramnios).  Fetal stomach and bladder are seen.   Impression: 1. 39 4/7 week Viable Singleton Intrauterine pregnancy by U/S. 2. (U/S) EDD is consistent with Clinically established (LMP) EDD of 07/30/17. 3. Adequate growth 4. oligohydramnios

## 2017-07-21 NOTE — Progress Notes (Signed)
Reviewed ultrasound findings. Discussed with Dr Valentino Saxonherry, will proceed with IOL tonight.

## 2017-07-21 NOTE — OB Triage Note (Signed)
Pt arrived from home for scheduled IOL. Pt denies contractions, leaking of fluid, or vaginal bleeding. Pt reports a decrease in fetal movement today. EFM and TOCO applied. Pt mother at bedside with pt.

## 2017-07-22 ENCOUNTER — Inpatient Hospital Stay: Admitting: Certified Registered"

## 2017-07-22 LAB — CBC
HCT: 35.9 % (ref 35.0–47.0)
Hemoglobin: 12.2 g/dL (ref 12.0–16.0)
MCH: 29.8 pg (ref 26.0–34.0)
MCHC: 34.1 g/dL (ref 32.0–36.0)
MCV: 87.3 fL (ref 80.0–100.0)
PLATELETS: 106 10*3/uL — AB (ref 150–440)
RBC: 4.11 MIL/uL (ref 3.80–5.20)
RDW: 17.5 % — AB (ref 11.5–14.5)
WBC: 18.5 10*3/uL — ABNORMAL HIGH (ref 3.6–11.0)

## 2017-07-22 MED ORDER — LACTATED RINGERS IV SOLN
500.0000 mL | Freq: Once | INTRAVENOUS | Status: AC
  Administered 2017-07-23: 500 mL via INTRAVENOUS

## 2017-07-22 MED ORDER — OXYTOCIN 10 UNIT/ML IJ SOLN
INTRAMUSCULAR | Status: AC
  Filled 2017-07-22: qty 2

## 2017-07-22 MED ORDER — FENTANYL 2.5 MCG/ML W/ROPIVACAINE 0.15% IN NS 100 ML EPIDURAL (ARMC)
EPIDURAL | Status: AC
  Filled 2017-07-22: qty 100

## 2017-07-22 MED ORDER — EPHEDRINE 5 MG/ML INJ
10.0000 mg | INTRAVENOUS | Status: DC | PRN
  Filled 2017-07-22: qty 2

## 2017-07-22 MED ORDER — LIDOCAINE-EPINEPHRINE (PF) 1.5 %-1:200000 IJ SOLN
INTRAMUSCULAR | Status: DC | PRN
  Administered 2017-07-22: 3 mL via EPIDURAL

## 2017-07-22 MED ORDER — DIPHENHYDRAMINE HCL 50 MG/ML IJ SOLN: 12.5000 mg | INTRAMUSCULAR | Status: DC | PRN

## 2017-07-22 MED ORDER — PHENYLEPHRINE 40 MCG/ML (10ML) SYRINGE FOR IV PUSH (FOR BLOOD PRESSURE SUPPORT)
80.0000 ug | PREFILLED_SYRINGE | INTRAVENOUS | Status: DC | PRN
Start: 2017-07-22 — End: 2017-07-23
  Filled 2017-07-22: qty 5

## 2017-07-22 MED ORDER — FENTANYL 2.5 MCG/ML W/ROPIVACAINE 0.15% IN NS 100 ML EPIDURAL (ARMC)
12.0000 mL/h | EPIDURAL | Status: DC
  Filled 2017-07-22: qty 100

## 2017-07-22 MED ORDER — LIDOCAINE HCL (PF) 1 % IJ SOLN
INTRAMUSCULAR | Status: DC | PRN
  Administered 2017-07-22: 3 mL via SUBCUTANEOUS

## 2017-07-22 MED ORDER — AMMONIA AROMATIC IN INHA
RESPIRATORY_TRACT | Status: AC
  Filled 2017-07-22: qty 10

## 2017-07-22 MED ORDER — LIDOCAINE HCL (PF) 1 % IJ SOLN
INTRAMUSCULAR | Status: AC
  Filled 2017-07-22: qty 30

## 2017-07-22 MED ORDER — BUPIVACAINE HCL (PF) 0.25 % IJ SOLN
INTRAMUSCULAR | Status: DC | PRN
  Administered 2017-07-22: 3 mL via EPIDURAL
  Administered 2017-07-22: 5 mL via EPIDURAL

## 2017-07-22 MED ORDER — FENTANYL 2.5 MCG/ML W/ROPIVACAINE 0.15% IN NS 100 ML EPIDURAL (ARMC)
EPIDURAL | Status: DC | PRN
  Administered 2017-07-22: 12 mL/h via EPIDURAL

## 2017-07-22 MED ORDER — PHENYLEPHRINE 40 MCG/ML (10ML) SYRINGE FOR IV PUSH (FOR BLOOD PRESSURE SUPPORT)
80.0000 ug | PREFILLED_SYRINGE | INTRAVENOUS | Status: DC | PRN
  Filled 2017-07-22: qty 5

## 2017-07-22 MED ORDER — MISOPROSTOL 200 MCG PO TABS
ORAL_TABLET | ORAL | Status: AC
  Administered 2017-07-23: 800 ug via RECTAL
  Filled 2017-07-22: qty 4

## 2017-07-22 NOTE — Progress Notes (Signed)
Alexandra Joseph is a 21 y.o. G1P0 at 3363w6d by LMP admitted for induction of labor due to Low amniotic fluid..  Subjective:  Reports pressure in vagina for last 30 minutes Objective: BP (!) 100/54   Pulse 92   Temp 97.9 F (36.6 C) (Oral)   Resp 16   Ht 5' (1.524 m)   Wt 165 lb (74.8 kg)   LMP 10/16/2016 (Exact Date)   SpO2 100%   BMI 32.22 kg/m  I/O last 3 completed shifts: In: 2914.2 [I.V.:2914.2] Out: -  No intake/output data recorded.  FHT:  FHR: 135 bpm, variability: moderate,  accelerations:  Present,  decelerations:  Absent UC:   regular, every 2-3 minutes SVE:   Dilation: 8 Effacement (%): 90 Station: 0 Exam by:: Alexandra Joseph , CNM  Labs: Lab Results  Component Value Date   WBC 18.5 (H) 07/22/2017   HGB 12.2 07/22/2017   HCT 35.9 07/22/2017   MCV 87.3 07/22/2017   PLT 106 (L) 07/22/2017    Assessment / Plan: Induction of labor due to oligohydramnious,  progressing well on pitocin  Labor: Progressing normally Preeclampsia:  labs stable Fetal Wellbeing:  Category I Pain Control:  Epidural I/D:  n/a Anticipated MOD:  NSVD  Alexandra Joseph 07/22/2017, 7:59 PM

## 2017-07-22 NOTE — Anesthesia Procedure Notes (Signed)
Epidural Patient location during procedure: OB  Staffing Anesthesiologist: Yves DillARROLL, PAUL Resident/CRNA: Mathews ArgyleLOGAN, Cohen Doleman Performed: resident/CRNA   Preanesthetic Checklist Completed: patient identified, site marked, surgical consent, pre-op evaluation, timeout performed, IV checked, risks and benefits discussed and monitors and equipment checked  Epidural Patient position: sitting Prep: ChloraPrep and site prepped and draped Patient monitoring: heart rate, continuous pulse ox and blood pressure Approach: midline Location: L3-L4 Injection technique: LOR saline  Needle:  Needle type: Tuohy  Needle gauge: 17 G Needle length: 9 cm and 9 Needle insertion depth: 6 cm Catheter type: closed end flexible Catheter size: 19 Gauge Catheter at skin depth: 11 cm Test dose: negative and 1.5% lidocaine with Epi 1:200 K  Assessment Events: blood not aspirated, injection not painful, no injection resistance, negative IV test and no paresthesia  Additional Notes   Patient tolerated the insertion well without complications.Reason for block:procedure for pain

## 2017-07-22 NOTE — Anesthesia Preprocedure Evaluation (Signed)
Anesthesia Evaluation  Patient identified by MRN, date of birth, ID band Patient awake    Reviewed: Allergy & Precautions, H&P , NPO status , Patient's Chart, lab work & pertinent test results  Airway Mallampati: III  TM Distance: >3 FB Neck ROM: full    Dental  (+) Chipped   Pulmonary neg pulmonary ROS,    Pulmonary exam normal        Cardiovascular negative cardio ROS Normal cardiovascular exam     Neuro/Psych  Headaches, Seizures - (childhood),     GI/Hepatic Neg liver ROS, GERD  ,  Endo/Other  negative endocrine ROS  Renal/GU negative Renal ROS  negative genitourinary   Musculoskeletal   Abdominal   Peds  Hematology negative hematology ROS (+)   Anesthesia Other Findings   Reproductive/Obstetrics (+) Pregnancy                             Anesthesia Physical Anesthesia Plan  ASA: II  Anesthesia Plan: Epidural   Post-op Pain Management:    Induction:   PONV Risk Score and Plan:   Airway Management Planned:   Additional Equipment:   Intra-op Plan:   Post-operative Plan:   Informed Consent: I have reviewed the patients History and Physical, chart, labs and discussed the procedure including the risks, benefits and alternatives for the proposed anesthesia with the patient or authorized representative who has indicated his/her understanding and acceptance.     Plan Discussed with: CRNA and Anesthesiologist  Anesthesia Plan Comments:         Anesthesia Quick Evaluation

## 2017-07-22 NOTE — Progress Notes (Signed)
Alexandra Joseph is a 21 y.o. G1P0 at 8664w6d by LMP admitted for induction of labor due to Low amniotic fluid..  Subjective:  Rates pain a 9 on pain scale, but wants to keep with IV medication at this time Objective: BP (!) 122/49 (BP Location: Left Arm)   Pulse 78   Temp 98.1 F (36.7 C) (Oral)   Resp 16   Ht 5' (1.524 m)   Wt 165 lb (74.8 kg)   LMP 10/16/2016 (Exact Date)   BMI 32.22 kg/m  I/O last 3 completed shifts: In: 1016.7 [I.V.:1016.7] Out: -  No intake/output data recorded.  FHT:  FHR: 140 bpm, variability: moderate,  accelerations:  Present,  decelerations:  Absent UC:   irregular, every 2-4 minutes SVE:   Dilation: 4 Effacement (%): 80 Station: -1 Exam by:: shambley  Labs: Lab Results  Component Value Date   WBC 11.8 (H) 07/21/2017   HGB 11.7 (L) 07/21/2017   HCT 33.7 (L) 07/21/2017   MCV 87.0 07/21/2017   PLT 116 (L) 07/21/2017    Assessment / Plan: IOL progressing normally, IUPC placed  Labor: Progressing normally Preeclampsia:  labs stable Fetal Wellbeing:  Category I Pain Control:  IV pain meds I/D:  n/a Anticipated MOD:  NSVD  Melody N Shambley 07/22/2017, 12:49 PM

## 2017-07-22 NOTE — Progress Notes (Signed)
Lind GuestMartina N Lagan is a 21 y.o. G1P0 at 3880w6d by LMP admitted for induction of labor due to Low amniotic fluid..  Subjective: Rates pain a 6 on pain scale, but is breathing with the well, declines pain medications at this time.  Objective: BP 123/72 (BP Location: Left Arm)   Pulse 80   Temp 98.6 F (37 C) (Oral)   Resp 18   Ht 5' (1.524 m)   Wt 165 lb (74.8 kg)   LMP 10/16/2016 (Exact Date)   BMI 32.22 kg/m  I/O last 3 completed shifts: In: 1016.7 [I.V.:1016.7] Out: -  No intake/output data recorded.  FHT:  FHR: 135 bpm, variability: moderate,  accelerations:  Present,  decelerations:  Absent UC:   irregular, every 1-3 minutes, moderate to palpation SVE:   Dilation: 2.5 Effacement (%): 70 Station: -1 Exam by:: Pacific MutualShambley, CNM  Labs: Lab Results  Component Value Date   WBC 11.8 (H) 07/21/2017   HGB 11.7 (L) 07/21/2017   HCT 33.7 (L) 07/21/2017   MCV 87.0 07/21/2017   PLT 116 (L) 07/21/2017    Assessment / Plan: cervical ripening, progressing well with SROM of clear fluid at 6am.  Labor: Progressing normally Preeclampsia:  labs stable Fetal Wellbeing:  Category I Pain Control:  Labor support without medications I/D:  n/a Anticipated MOD:  NSVD  Jatavian Calica N Merrick Maggio 07/22/2017, 7:33 AM

## 2017-07-23 ENCOUNTER — Encounter
Admission: EM | Disposition: A | Discharge status: Home / Self Care | Payer: Self-pay | Source: Ambulatory Visit | Attending: Obstetrics and Gynecology

## 2017-07-23 ENCOUNTER — Encounter: Admitting: Certified Nurse Midwife

## 2017-07-23 ENCOUNTER — Inpatient Hospital Stay: Admitting: Anesthesiology

## 2017-07-23 ENCOUNTER — Other Ambulatory Visit

## 2017-07-23 ENCOUNTER — Encounter: Payer: Self-pay | Admitting: Anesthesiology

## 2017-07-23 DIAGNOSIS — O4103X0 Oligohydramnios, third trimester, not applicable or unspecified: Principal | ICD-10-CM

## 2017-07-23 DIAGNOSIS — Z3A38 38 weeks gestation of pregnancy: Secondary | ICD-10-CM

## 2017-07-23 HISTORY — PX: DILATION AND EVACUATION: SHX1459

## 2017-07-23 LAB — CBC
HCT: 27.7 % — ABNORMAL LOW (ref 35.0–47.0)
Hemoglobin: 9.4 g/dL — ABNORMAL LOW (ref 12.0–16.0)
MCH: 29.6 pg (ref 26.0–34.0)
MCHC: 34 g/dL (ref 32.0–36.0)
MCV: 87.2 fL (ref 80.0–100.0)
PLATELETS: 105 10*3/uL — AB (ref 150–440)
RBC: 3.17 MIL/uL — AB (ref 3.80–5.20)
RDW: 17.5 % — ABNORMAL HIGH (ref 11.5–14.5)
WBC: 18.3 10*3/uL — AB (ref 3.6–11.0)

## 2017-07-23 LAB — SYPHILIS: RPR W/REFLEX TO RPR TITER AND TREPONEMAL ANTIBODIES, TRADITIONAL SCREENING AND DIAGNOSIS ALGORITHM: RPR Ser Ql: NONREACTIVE

## 2017-07-23 SURGERY — DILATION AND EVACUATION, UTERUS
Anesthesia: General

## 2017-07-23 MED ORDER — MIDAZOLAM HCL 2 MG/2ML IJ SOLN
INTRAMUSCULAR | Status: AC
  Filled 2017-07-23: qty 2

## 2017-07-23 MED ORDER — ZOLPIDEM TARTRATE 5 MG PO TABS: 5.0000 mg | ORAL_TABLET | Freq: Every evening | ORAL | Status: DC | PRN

## 2017-07-23 MED ORDER — SUCCINYLCHOLINE CHLORIDE 20 MG/ML IJ SOLN
INTRAMUSCULAR | Status: DC | PRN
  Administered 2017-07-23: 80 mg via INTRAVENOUS

## 2017-07-23 MED ORDER — WITCH HAZEL-GLYCERIN EX PADS
1.0000 "application " | MEDICATED_PAD | CUTANEOUS | Status: DC | PRN
  Administered 2017-07-24: 1 via TOPICAL

## 2017-07-23 MED ORDER — OXYTOCIN 10 UNIT/ML IJ SOLN
INTRAMUSCULAR | Status: AC
  Filled 2017-07-23: qty 1

## 2017-07-23 MED ORDER — OXYTOCIN 40 UNITS IN LACTATED RINGERS INFUSION - SIMPLE MED
INTRAVENOUS | Status: AC
  Administered 2017-07-23: 09:00:00
  Filled 2017-07-23: qty 1000

## 2017-07-23 MED ORDER — ONDANSETRON HCL 4 MG/2ML IJ SOLN: 4.0000 mg | Freq: Once | INTRAMUSCULAR | Status: DC | PRN

## 2017-07-23 MED ORDER — COCONUT OIL OIL
1.0000 "application " | TOPICAL_OIL | Status: DC | PRN
  Administered 2017-07-23: 1 via TOPICAL
  Filled 2017-07-23 (×2): qty 120

## 2017-07-23 MED ORDER — METHYLERGONOVINE MALEATE 0.2 MG PO TABS
0.2000 mg | ORAL_TABLET | Freq: Four times a day (QID) | ORAL | Status: AC
  Administered 2017-07-23 – 2017-07-24 (×3): 0.2 mg via ORAL
  Filled 2017-07-23 (×4): qty 1

## 2017-07-23 MED ORDER — SIMETHICONE 80 MG PO CHEW: 80.0000 mg | CHEWABLE_TABLET | ORAL | Status: DC | PRN

## 2017-07-23 MED ORDER — PRENATAL MULTIVITAMIN CH
1.0000 | ORAL_TABLET | Freq: Every day | ORAL | Status: DC
  Administered 2017-07-23 – 2017-07-24 (×2): 1 via ORAL
  Filled 2017-07-23 (×2): qty 1

## 2017-07-23 MED ORDER — ACETAMINOPHEN 325 MG PO TABS
650.0000 mg | ORAL_TABLET | ORAL | Status: DC | PRN
  Administered 2017-07-23 – 2017-07-24 (×3): 650 mg via ORAL
  Filled 2017-07-23 (×3): qty 2

## 2017-07-23 MED ORDER — OXYCODONE HCL 5 MG PO TABS
5.0000 mg | ORAL_TABLET | ORAL | Status: DC | PRN
  Administered 2017-07-23 (×3): 5 mg via ORAL
  Filled 2017-07-23 (×3): qty 1

## 2017-07-23 MED ORDER — FENTANYL CITRATE (PF) 100 MCG/2ML IJ SOLN
INTRAMUSCULAR | Status: DC | PRN
  Administered 2017-07-23 (×2): 50 ug via INTRAVENOUS

## 2017-07-23 MED ORDER — MIDAZOLAM HCL 2 MG/2ML IJ SOLN
INTRAMUSCULAR | Status: DC | PRN
  Administered 2017-07-23: 2 mg via INTRAVENOUS

## 2017-07-23 MED ORDER — LIDOCAINE HCL (PF) 2 % IJ SOLN
INTRAMUSCULAR | Status: AC
Start: 2017-07-23 — End: 2017-07-23
  Filled 2017-07-23: qty 2

## 2017-07-23 MED ORDER — SUCCINYLCHOLINE CHLORIDE 20 MG/ML IJ SOLN
INTRAMUSCULAR | Status: AC
  Filled 2017-07-23: qty 1

## 2017-07-23 MED ORDER — FENTANYL CITRATE (PF) 100 MCG/2ML IJ SOLN
INTRAMUSCULAR | Status: AC
  Filled 2017-07-23: qty 2

## 2017-07-23 MED ORDER — ONDANSETRON HCL 4 MG/2ML IJ SOLN
INTRAMUSCULAR | Status: DC | PRN
  Administered 2017-07-23: 4 mg via INTRAVENOUS

## 2017-07-23 MED ORDER — PROPOFOL 10 MG/ML IV BOLUS
INTRAVENOUS | Status: DC | PRN
  Administered 2017-07-23: 150 mg via INTRAVENOUS

## 2017-07-23 MED ORDER — BENZOCAINE-MENTHOL 20-0.5 % EX AERO
INHALATION_SPRAY | CUTANEOUS | Status: AC
  Filled 2017-07-23: qty 56

## 2017-07-23 MED ORDER — IBUPROFEN 600 MG PO TABS
600.0000 mg | ORAL_TABLET | Freq: Four times a day (QID) | ORAL | Status: DC
  Administered 2017-07-23 – 2017-07-24 (×7): 600 mg via ORAL
  Filled 2017-07-23 (×7): qty 1

## 2017-07-23 MED ORDER — DIBUCAINE 1 % RE OINT
1.0000 | TOPICAL_OINTMENT | RECTAL | Status: DC | PRN
Start: 2017-07-23 — End: 2017-07-25

## 2017-07-23 MED ORDER — LACTATED RINGERS IV SOLN
INTRAVENOUS | Status: DC | PRN
  Administered 2017-07-23: 21:00:00 via INTRAVENOUS

## 2017-07-23 MED ORDER — MISOPROSTOL 200 MCG PO TABS
800.0000 ug | ORAL_TABLET | Freq: Once | ORAL | Status: AC
  Administered 2017-07-23: 800 ug via RECTAL

## 2017-07-23 MED ORDER — ONDANSETRON HCL 4 MG/2ML IJ SOLN: 4.0000 mg | INTRAMUSCULAR | Status: DC | PRN

## 2017-07-23 MED ORDER — BENZOCAINE-MENTHOL 20-0.5 % EX AERO
1.0000 "application " | INHALATION_SPRAY | CUTANEOUS | Status: DC | PRN
  Filled 2017-07-23: qty 56

## 2017-07-23 MED ORDER — ONDANSETRON HCL 4 MG PO TABS: 4.0000 mg | ORAL_TABLET | ORAL | Status: DC | PRN

## 2017-07-23 MED ORDER — LIDOCAINE HCL (CARDIAC) 20 MG/ML IV SOLN
INTRAVENOUS | Status: DC | PRN
  Administered 2017-07-23: 40 mg via INTRAVENOUS

## 2017-07-23 MED ORDER — PROPOFOL 10 MG/ML IV BOLUS
INTRAVENOUS | Status: AC
Start: 2017-07-23 — End: 2017-07-23
  Filled 2017-07-23: qty 20

## 2017-07-23 MED ORDER — IBUPROFEN 600 MG PO TABS
ORAL_TABLET | ORAL | Status: AC
  Administered 2017-07-23: 600 mg via ORAL
  Filled 2017-07-23: qty 1

## 2017-07-23 MED ORDER — DEXAMETHASONE SODIUM PHOSPHATE 10 MG/ML IJ SOLN
INTRAMUSCULAR | Status: DC | PRN
  Administered 2017-07-23: 10 mg via INTRAVENOUS

## 2017-07-23 MED ORDER — WITCH HAZEL-GLYCERIN EX PADS
MEDICATED_PAD | CUTANEOUS | Status: AC
  Filled 2017-07-23: qty 100

## 2017-07-23 MED ORDER — OXYTOCIN 10 UNIT/ML IJ SOLN
INTRAVENOUS | Status: DC | PRN
  Administered 2017-07-23: 20 [IU] via INTRAVENOUS

## 2017-07-23 MED ORDER — LACTATED RINGERS IV SOLN
INTRAVENOUS | Status: DC
Start: 2017-07-23 — End: 2017-07-25
  Administered 2017-07-24: 06:00:00 via INTRAVENOUS

## 2017-07-23 MED ORDER — FENTANYL CITRATE (PF) 100 MCG/2ML IJ SOLN: 25.0000 ug | INTRAMUSCULAR | Status: DC | PRN

## 2017-07-23 MED ORDER — SENNOSIDES-DOCUSATE SODIUM 8.6-50 MG PO TABS
2.0000 | ORAL_TABLET | ORAL | Status: DC
  Administered 2017-07-24: 2 via ORAL
  Filled 2017-07-23: qty 2

## 2017-07-23 MED ORDER — DIPHENHYDRAMINE HCL 25 MG PO CAPS: 25.0000 mg | ORAL_CAPSULE | Freq: Four times a day (QID) | ORAL | Status: DC | PRN

## 2017-07-23 SURGICAL SUPPLY — 25 items
ADAPTER VACURETTE TBG SET 14 (CANNULA) ×1 IMPLANT
CATH ROBINSON RED A/P 16FR (CATHETERS) ×3 IMPLANT
DRSG TELFA 3X8 NADH (GAUZE/BANDAGES/DRESSINGS) ×3 IMPLANT
FILTER UTR ASPR SPEC (MISCELLANEOUS) ×1 IMPLANT
FLTR UTR ASPR SPEC (MISCELLANEOUS)
GLOVE ORTHO TXT STRL SZ7.5 (GLOVE) ×5 IMPLANT
GOWN STRL REUS W/ TWL LRG LVL3 (GOWN DISPOSABLE) ×1 IMPLANT
GOWN STRL REUS W/TWL LRG LVL3 (GOWN DISPOSABLE) ×6
KIT BERKELEY 1ST TRIMESTER 3/8 (MISCELLANEOUS) ×1 IMPLANT
KIT RM TURNOVER STRD PROC AR (KITS) ×3 IMPLANT
NDL HYPO 25X1 1.5 SAFETY (NEEDLE) ×1 IMPLANT
NEEDLE HYPO 25X1 1.5 SAFETY (NEEDLE) IMPLANT
PACK DNC HYST (MISCELLANEOUS) ×3 IMPLANT
PAD DRESSING TELFA 3X8 NADH (GAUZE/BANDAGES/DRESSINGS) ×1 IMPLANT
PAD OB MATERNITY 4.3X12.25 (PERSONAL CARE ITEMS) ×3 IMPLANT
PAD PREP 24X41 OB/GYN DISP (PERSONAL CARE ITEMS) ×3 IMPLANT
SET BERKELEY SUCTION TUBING (SUCTIONS) ×3 IMPLANT
SOL PREP PVP 2OZ (MISCELLANEOUS)
SOLUTION PREP PVP 2OZ (MISCELLANEOUS) ×1 IMPLANT
SPONGE XRAY 4X4 16PLY STRL (MISCELLANEOUS) ×3 IMPLANT
VACURETTE 10 RIGID CVD (CANNULA) ×1 IMPLANT
VACURETTE 12 RIGID CVD (CANNULA) ×1 IMPLANT
VACURETTE 7MM F TIP (CANNULA)
VACURETTE 7MM F TIP STRL (CANNULA) ×1 IMPLANT
VACURETTE 8 RIGID CVD (CANNULA) ×1 IMPLANT

## 2017-07-23 NOTE — Interval H&P Note (Signed)
History and Physical Interval Note:  07/23/2017 8:42 PM  Alexandra Joseph  has presented today for surgery, with the diagnosis of retained placenta  The various methods of treatment have been discussed with the patient and family. After consideration of risks, benefits and other options for treatment, the patient has consented to  Procedure(s): DILATATION AND EVACUATION (N/A) as a surgical intervention .  The patient's history has been reviewed, patient examined, no change in status, stable for surgery.  I have reviewed the patient's chart and labs.  Questions were answered to the patient's satisfaction.     Brennan Baileyavid Samhita Kretsch MD

## 2017-07-23 NOTE — Progress Notes (Signed)
Post Partum Day 0 Subjective: up ad lib, voiding and reports feeling lightheaded and weaker with last two bathroom visits.  Objective: Blood pressure (!) 126/59, pulse (!) 124, temperature 98.5 F (36.9 C), temperature source Oral, resp. rate 20, height 5' (1.524 m), weight 165 lb (74.8 kg), last menstrual period 10/16/2016, SpO2 99 %. methergine po given with no real improvement. Pitocin still infusing IV.  Physical Exam:  General: alert, cooperative, appears stated age, fatigued and pale Lochia: increased steady flow with clots and soaking pad and under buttock drape in 60-90 minutes; small clots with membranes noted on sterile speculum exam, removed with sponge clamp and slowly blood returns and fills up cervix with clots. Small piece of placental tissue noted in one clots upon removal.external Uterine Fundus: soft and responded somewhat to fundal massage and bimanual massage, with LUS and cervix remaining floppy. External labia edematous and 2nd degree repair intact, not draining. DVT Evaluation: No evidence of DVT seen on physical exam. Negative Homan's sign. Calf/Ankle edema is present.   Recent Labs  07/22/17 1649 07/23/17 1829  HGB 12.2 9.4*  HCT 35.9 27.7*    Assessment/Plan: Contraception NA Postpartum bleed, lower uterine atony, expressed small amount retained membranes/placental tissue Dr Logan BoresEvans notified and in route to examine patient and plans to discuss D&E. Will keep NPO    LOS: 2 days   Alexandra Joseph 07/23/2017, 8:13 PM

## 2017-07-23 NOTE — Anesthesia Procedure Notes (Signed)
Procedure Name: Intubation Date/Time: 07/23/2017 9:27 PM Performed by: Ginger CarneMICHELET, Satine Hausner Pre-anesthesia Checklist: Patient identified, Patient being monitored, Timeout performed, Emergency Drugs available and Suction available Patient Re-evaluated:Patient Re-evaluated prior to induction Oxygen Delivery Method: Circle system utilized Preoxygenation: Pre-oxygenation with 100% oxygen Induction Type: IV induction Laryngoscope Size: Miller and 2 Grade View: Grade II Tube type: Oral Tube size: 7.0 mm Number of attempts: 1 Airway Equipment and Method: Stylet Placement Confirmation: ETT inserted through vocal cords under direct vision,  positive ETCO2 and breath sounds checked- equal and bilateral Secured at: 21 cm Tube secured with: Tape Dental Injury: Teeth and Oropharynx as per pre-operative assessment

## 2017-07-23 NOTE — Progress Notes (Signed)
Alexandra Joseph is a 21 y.o. G1P0 at 9359w0d by LMP admitted for induction of labor due to Low amniotic fluid..  Subjective: Reports all over abdomen pain and in general not feeling well, tearful  Objective: BP (!) 106/45   Pulse (!) 103   Temp 99.8 F (37.7 C) (Oral)   Resp 18   Ht 5' (1.524 m)   Wt 165 lb (74.8 kg)   LMP 10/16/2016 (Exact Date)   SpO2 100%   BMI 32.22 kg/m  I/O last 3 completed shifts: In: 2914.2 [I.V.:2914.2] Out: -  No intake/output data recorded.  FHT:  FHR: 160 bpm, variability: minimal ,  accelerations:  Present,  decelerations:  Absent UC:   regular, every 2-4 minutes SVE:   Dilation: Lip/rim Effacement (%): 90, 100 Station: +1 Exam by:: Alexandra Joseph, CNM   Labs: Lab Results  Component Value Date   WBC 18.5 (H) 07/22/2017   HGB 12.2 07/22/2017   HCT 35.9 07/22/2017   MCV 87.3 07/22/2017   PLT 106 (L) 07/22/2017    Assessment / Plan: maternal fever resolving with tylenol, will start pushing, anesthesia called to dose epidural, Dr Alexandra Joseph aware.  Labor: stalled progression at 9-10cm due to inadequate uterine contractions Preeclampsia:  labs stable Fetal Wellbeing:  Category II Pain Control:  Epidural I/D:  n/a Anticipated MOD:  NSVD with concern for c/s if unable to push well.  Avalin Joseph N Matthe Joseph 07/23/2017, 3:05 AM

## 2017-07-23 NOTE — Anesthesia Preprocedure Evaluation (Addendum)
Anesthesia Evaluation  Patient identified by MRN, date of birth, ID band Patient awake    Reviewed: Allergy & Precautions, H&P , NPO status , Patient's Chart, lab work & pertinent test results  History of Anesthesia Complications Negative for: history of anesthetic complications  Airway Mallampati: III  TM Distance: >3 FB Neck ROM: full    Dental  (+) Chipped, Dental Advidsory Given   Pulmonary neg pulmonary ROS,           Cardiovascular negative cardio ROS       Neuro/Psych  Headaches, Seizures - (childhood),     GI/Hepatic Neg liver ROS, GERD  ,  Endo/Other  negative endocrine ROS  Renal/GU negative Renal ROS  negative genitourinary   Musculoskeletal   Abdominal   Peds  Hematology  (+) Blood dyscrasia, anemia ,   Anesthesia Other Findings Past Medical History: No date: Anemia No date: Anxiety No date: Costochondritis No date: Depression No date: Menorrhagia No date: Migraine No date: Tachycardia   Reproductive/Obstetrics negative OB ROS                            Anesthesia Physical  Anesthesia Plan  ASA: II  Anesthesia Plan: General   Post-op Pain Management:    Induction: Intravenous, Rapid sequence and Cricoid pressure planned  PONV Risk Score and Plan: 3 and Ondansetron, Dexamethasone and Midazolam  Airway Management Planned: Oral ETT  Additional Equipment:   Intra-op Plan:   Post-operative Plan: Extubation in OR  Informed Consent: I have reviewed the patients History and Physical, chart, labs and discussed the procedure including the risks, benefits and alternatives for the proposed anesthesia with the patient or authorized representative who has indicated his/her understanding and acceptance.   Dental advisory given  Plan Discussed with: CRNA and Anesthesiologist  Anesthesia Plan Comments:        Anesthesia Quick Evaluation

## 2017-07-23 NOTE — Progress Notes (Addendum)
At shift change, 19:08, Melody Shambley, CNM was notified that patient bleeding was increasing.  Patient was complaining of dizziness, slight SOB, and fatigued.  RN observed that patient was also diaphoretic and pale.  Melody performed a sterile speculum exam and removed 12-15 clots ranging from quarter to lemon size.  Patient vitals were WNL.  Melody made a call to Dr. Logan BoresEvans and he decided to do a D&C.  Dr. Logan BoresEvans spoke with patient and family and explained procedure.  Consents were signed and patient was transported for prep and procedure.

## 2017-07-23 NOTE — Anesthesia Post-op Follow-up Note (Signed)
Anesthesia QCDR form completed.        

## 2017-07-23 NOTE — Progress Notes (Signed)
Patient returned to floor after procedure.  Patient slightly lethargic, but oriented.  Patient denied pain.  Fundus is firm and midline at U/1.  Bleeding is scant.  Vitals are WNL.

## 2017-07-23 NOTE — Transfer of Care (Signed)
Immediate Anesthesia Transfer of Care Note  Patient: Alexandra Joseph  Procedure(s) Performed: Procedure(s): DILATATION AND EVACUATION (N/A)  Patient Location: PACU  Anesthesia Type:General  Level of Consciousness: sedated  Airway & Oxygen Therapy: Patient Spontanous Breathing and Patient connected to face mask oxygen  Post-op Assessment: Report given to RN and Post -op Vital signs reviewed and stable  Post vital signs: Reviewed and stable  Last Vitals:  Vitals:   07/23/17 2023 07/23/17 2202  BP: 130/70 (!) 117/53  Pulse: 85   Resp: 18 17  Temp: 37.2 C 36.6 C  SpO2: 99% (P) 100%    Last Pain:  Vitals:   07/23/17 2023  TempSrc: Oral  PainSc:          Complications: No apparent anesthesia complications

## 2017-07-23 NOTE — Op Note (Signed)
    OPERATIVE NOTE 07/23/2017 9:59 PM  PRE-OPERATIVE DIAGNOSIS:  1) retained placenta/products 2) PPH  POST-OPERATIVE DIAGNOSIS:  Same  OPERATION:  D&C  SURGEON(S): Surgeon(s) and Role:    Linzie Collin* Cheyene Hamric James, MD - Primary   ANESTHESIA: General  ESTIMATED BLOOD LOSS: 150ml  OPERATIVE FINDINGS: retained placental products with clots  SPECIMEN:  ID Type Source Tests Collected by Time Destination  1 : retained placenta Tissue [facility] Other SURGICAL PATHOLOGY Linzie CollinEvans, Emeterio Balke James, MD 07/23/2017 2136     COMPLICATIONS: None  DRAINS: Foley to gravity  DISPOSITION: Stable to recovery room  DESCRIPTION OF PROCEDURE:      The patient was prepped and draped in the dorsal lithotomy position and placed under general anesthesia. Her cervix was grasped with a Ring forceps. The cervix was carefully inspected in a circumferential manner and no tears were noted.  Using a banjo curet, a systematic curettage was performed in all quadrants.  Tissue and clot were noted especially from the right anterior aspect of the endometrial cavity.  We continued a gentle curettage of all quadrants until no additional tissue or clots were noted.  The uterus became firm. Pitocin was run in the IV. The forceps was removed from the cervix and hemostasis was noted. The weighted speculum was removed and the patient went to recovery room in stable condition.  No follow-up provider specified.  Elonda Huskyavid J. Ralph Benavidez, M.D. 07/23/2017 9:59 PM

## 2017-07-24 LAB — CBC
HEMATOCRIT: 27.9 % — AB (ref 35.0–47.0)
HEMOGLOBIN: 9.5 g/dL — AB (ref 12.0–16.0)
MCH: 30.1 pg (ref 26.0–34.0)
MCHC: 34 g/dL (ref 32.0–36.0)
MCV: 88.6 fL (ref 80.0–100.0)
Platelets: 106 10*3/uL — ABNORMAL LOW (ref 150–440)
RBC: 3.14 MIL/uL — ABNORMAL LOW (ref 3.80–5.20)
RDW: 18.3 % — AB (ref 11.5–14.5)
WBC: 17.9 10*3/uL — ABNORMAL HIGH (ref 3.6–11.0)

## 2017-07-24 NOTE — Anesthesia Postprocedure Evaluation (Signed)
Anesthesia Post Note  Patient: Alexandra Joseph  Procedure(s) Performed: Procedure(s) (LRB): DILATATION AND EVACUATION (N/A)  Patient location during evaluation: PACU Anesthesia Type: General Level of consciousness: awake and alert Pain management: pain level controlled Vital Signs Assessment: post-procedure vital signs reviewed and stable Respiratory status: spontaneous breathing, nonlabored ventilation, respiratory function stable and patient connected to nasal cannula oxygen Cardiovascular status: blood pressure returned to baseline and stable Postop Assessment: no signs of nausea or vomiting Anesthetic complications: no     Last Vitals:  Vitals:   07/24/17 0200 07/24/17 0304  BP: (!) 125/57 (!) 125/47  Pulse: 89 87  Resp: 18 18  Temp: 37.1 C 36.9 C  SpO2: 98% 99%    Last Pain:  Vitals:   07/24/17 0304  TempSrc: Oral  PainSc:                  Lenard SimmerAndrew Neysa Arts

## 2017-07-24 NOTE — Progress Notes (Signed)
Patient ID: Alexandra Joseph, female   DOB: 02/28/1996, 21 y.o.   MRN: [id number]   Progress Note - Vaginal Delivery  Alexandra Joseph is a 21 y.o. G1P0 now PP day 1 s/p Vaginal, Spontaneous Delivery .   Subjective:  The patient reports no complaints, up ad lib and voiding. Not lightheaded.  Objective:  Vital signs in last 24 hours: Temp:  [97.5 F (36.4 C)-99 F (37.2 C)] 98 F (36.7 C) (09/08 0829) Pulse Rate:  [74-124] 77 (09/08 0829) Resp:  [13-24] 20 (09/08 0829) BP: (89-130)/(47-74) 120/64 (09/08 0829) SpO2:  [91 %-100 %] 98 % (09/08 0829)  Physical Exam:  General: alert, cooperative and pale Lochia: scant Uterine Fundus: firm DVT Evaluation: No evidence of DVT seen on physical exam.    Data Review  Recent Labs  07/23/17 1829 07/24/17 0503  HGB 9.4* 9.5*  HCT 27.7* 27.9*    Assessment/Plan: Active Problems:   Oligohydramnios antepartum   Delay postpartum hemorrhage   Normal spontaneous vaginal delivery   [facility]  -- Continue routine PP care.     Elonda Huskyavid J. Evans, M.D. 07/24/2017 10:45 AM

## 2017-07-24 NOTE — Plan of Care (Signed)
Problem: Life Cycle: Goal: Risk for postpartum hemorrhage will decrease Outcome: Progressing Pt is S/P D&C for PPH. Her Lochial Flow is scant and her Fundus is U/E.   Problem: Pain Management: Goal: General experience of comfort will improve and pain level will decrease Outcome: Progressing Pt. States Pain is,"Easing Up" on scheduled Ibuprofen and PRN Tylenol . She has PRN Roxi. Ordered but states that was not relieving her pain yesterday. Heating Pad provided and Pt. Is going to try this when she is back in bed. For lower back pain.

## 2017-07-24 NOTE — Anesthesia Postprocedure Evaluation (Signed)
Anesthesia Post Note  Patient: Alexandra Joseph  Procedure(s) Performed: * No procedures listed *  Patient location during evaluation: Mother Baby Anesthesia Type: Epidural Level of consciousness: awake and alert Pain management: pain level controlled Vital Signs Assessment: post-procedure vital signs reviewed and stable Respiratory status: spontaneous breathing, nonlabored ventilation and respiratory function stable Cardiovascular status: stable Postop Assessment: no headache, no backache and patient able to bend at knees Anesthetic complications: no     Last Vitals:  Vitals:   07/24/17 0304 07/24/17 0829  BP: (!) 125/47 120/64  Pulse: 87 77  Resp: 18 20  Temp: 36.9 C 36.7 C  SpO2: 99% 98%    Last Pain:  Vitals:   07/24/17 0845  TempSrc:   PainSc: 0-No pain                 Cleda MccreedyJoseph K Piscitello

## 2017-07-24 NOTE — Progress Notes (Signed)
Post Partum Day 1 Subjective: no complaints, up ad lib, voiding and denies any dizzy spells this am.  Objective: Blood pressure 120/64, pulse 77, temperature 98 F (36.7 C), temperature source Oral, resp. rate 20, height 5' (1.524 m), weight 165 lb (74.8 kg), last menstrual period 10/16/2016, SpO2 98 %.  Physical Exam:  General: alert, cooperative, appears stated age, fatigued and pale Lochia: appropriate Uterine Fundus: firm DVT Evaluation: No evidence of DVT seen on physical exam. Negative Homan's sign. Calf/Ankle edema is present.   Recent Labs  07/23/17 1829 07/24/17 0503  HGB 9.4* 9.5*  HCT 27.7* 27.9*    Assessment/Plan: Plan for discharge tomorrow Infant feeding Breast    LOS: 3 days   Melody N Shambley 07/24/2017, 11:24 AM

## 2017-07-25 ENCOUNTER — Encounter: Payer: Self-pay | Admitting: Obstetrics and Gynecology

## 2017-07-25 MED ORDER — IBUPROFEN 600 MG PO TABS: 600.0000 mg | ORAL_TABLET | Freq: Four times a day (QID) | ORAL | 0 refills | Status: DC

## 2017-07-25 MED ORDER — CITRANATAL BLOOM 90-1 MG PO TABS: 1.0000 | ORAL_TABLET | Freq: Every day | ORAL | 2 refills | Status: DC

## 2017-07-25 MED ORDER — FUSION PLUS PO CAPS: 1.0000 | ORAL_CAPSULE | Freq: Every day | ORAL | 1 refills | Status: DC

## 2017-07-25 MED ORDER — NORETHINDRONE 0.35 MG PO TABS: 1.0000 | ORAL_TABLET | Freq: Every day | ORAL | 11 refills | Status: DC

## 2017-07-25 MED ORDER — VITAMIN D3 125 MCG (5000 UT) PO CAPS: 1.0000 | ORAL_CAPSULE | Freq: Every day | ORAL | 2 refills | Status: DC

## 2017-07-25 MED ORDER — DOCUSATE SODIUM 100 MG PO CAPS: 100.0000 mg | ORAL_CAPSULE | Freq: Every day | ORAL | 0 refills | Status: DC

## 2017-07-25 NOTE — Discharge Summary (Signed)
Obstetric Discharge Summary Reason for Admission: induction of labor Prenatal Procedures: NST and ultrasound Intrapartum Procedures: spontaneous vaginal delivery Postpartum Procedures: curettage Complications-Operative and Postpartum: 2nd degree perineal laceration, hemorrhage and D&E of retained placental fragments per Dr Logan BoresEvans  Delivery Note At 5:30 AM a viable and healthy female was delivered via Vaginal, Spontaneous Delivery (Presentation: OA)  Anesthesia:  epidural Episiotomy:  none Lacerations: 2nd degree Suture Repair: 3.0 vicryl rapide Est. Blood Loss (mL):    Mom to postpartum.  Baby to Couplet care / Skin to Skin.  Alexandra Joseph 07/25/2017, 10:42 AM    APGAR: , ; weight  .     H/H:  Lab Results  Component Value Date/Time   HGB 9.5 (L) 07/24/2017 05:03 AM   HGB 10.5 (L) 06/04/2017 03:13 PM   HCT 27.9 (L) 07/24/2017 05:03 AM   HCT 31.4 (L) 06/04/2017 03:13 PM    Discharge Diagnoses: Term Pregnancy-delivered and PPH hemorrhage secondary to retained placenta and D&E  Discharge Information: Date: 07/25/2017 Activity: pelvic rest Diet: routine Baby feeding: plans to breastfeed, nipple assessment done Contraception: oral progesterone-only contraceptive Medications: PNV, Ibuprofen, Colace and Iron Condition: stable Instructions: refer to practice specific booklet Discharge to: home   Alexandra Joseph,CNM 07/25/2017,10:42 AM

## 2017-07-25 NOTE — Progress Notes (Signed)
Discharge inst reviewed with pt.  Verb u/o 

## 2017-07-25 NOTE — Progress Notes (Signed)
Discharge to home with NB

## 2017-07-27 ENCOUNTER — Other Ambulatory Visit

## 2017-07-27 ENCOUNTER — Encounter: Admitting: Certified Nurse Midwife

## 2017-07-27 LAB — SURGICAL PATHOLOGY

## 2017-07-31 ENCOUNTER — Emergency Department
Admission: EM | Admit: 2017-07-31 | Discharge: 2017-08-01 | Disposition: A | Discharge status: Home / Self Care | Attending: Emergency Medicine | Admitting: Emergency Medicine

## 2017-07-31 ENCOUNTER — Encounter: Payer: Self-pay | Admitting: Emergency Medicine

## 2017-07-31 DIAGNOSIS — N938 Other specified abnormal uterine and vaginal bleeding: Secondary | ICD-10-CM | POA: Insufficient documentation

## 2017-07-31 DIAGNOSIS — R1030 Lower abdominal pain, unspecified: Secondary | ICD-10-CM | POA: Insufficient documentation

## 2017-07-31 DIAGNOSIS — N390 Urinary tract infection, site not specified: Secondary | ICD-10-CM

## 2017-07-31 DIAGNOSIS — Z79899 Other long term (current) drug therapy: Secondary | ICD-10-CM | POA: Diagnosis not present

## 2017-07-31 DIAGNOSIS — R509 Fever, unspecified: Secondary | ICD-10-CM | POA: Diagnosis present

## 2017-07-31 LAB — CBC WITH DIFFERENTIAL/PLATELET
BASOS ABS: 0 10*3/uL (ref 0–0.1)
BASOS PCT: 0 %
EOS ABS: 0.2 10*3/uL (ref 0–0.7)
Eosinophils Relative: 2 %
HCT: 31.4 % — ABNORMAL LOW (ref 35.0–47.0)
HEMOGLOBIN: 10.8 g/dL — AB (ref 12.0–16.0)
LYMPHS ABS: 2.5 10*3/uL (ref 1.0–3.6)
Lymphocytes Relative: 30 %
MCH: 30.2 pg (ref 26.0–34.0)
MCHC: 34.3 g/dL (ref 32.0–36.0)
MCV: 88.2 fL (ref 80.0–100.0)
Monocytes Absolute: 0.6 10*3/uL (ref 0.2–0.9)
Monocytes Relative: 7 %
NEUTROS PCT: 61 %
Neutro Abs: 5 10*3/uL (ref 1.4–6.5)
PLATELETS: 303 10*3/uL (ref 150–440)
RBC: 3.56 MIL/uL — AB (ref 3.80–5.20)
RDW: 17.5 % — ABNORMAL HIGH (ref 11.5–14.5)
WBC: 8.3 10*3/uL (ref 3.6–11.0)

## 2017-07-31 LAB — BASIC METABOLIC PANEL
ANION GAP: 6 (ref 5–15)
BUN: 15 mg/dL (ref 6–20)
CHLORIDE: 110 mmol/L (ref 101–111)
CO2: 25 mmol/L (ref 22–32)
Calcium: 8.9 mg/dL (ref 8.9–10.3)
Creatinine, Ser: 0.5 mg/dL (ref 0.44–1.00)
GFR calc non Af Amer: >60 mL/min (ref >60)
Glucose, Bld: 105 mg/dL — ABNORMAL HIGH (ref 65–99)
POTASSIUM: 3.7 mmol/L (ref 3.5–5.1)
SODIUM: 141 mmol/L (ref 135–145)

## 2017-07-31 NOTE — ED Notes (Addendum)
Pt had a baby 07/23/17 here at [facility]; vaginal delivery; after delivery pt had to have D & C due to having bleeding; pt returns tonight with heavy vaginal bleeding, dizziness and fever; temp at home 100.3 that has since come down after ibuprofen; pt reports changing sanitary pad at least every hour;

## 2017-08-01 ENCOUNTER — Emergency Department

## 2017-08-01 LAB — URINALYSIS, COMPLETE (UACMP) WITH MICROSCOPIC
Bilirubin Urine: NEGATIVE
Glucose, UA: NEGATIVE mg/dL
Ketones, ur: NEGATIVE mg/dL
Nitrite: NEGATIVE
PROTEIN: 100 mg/dL — AB
SPECIFIC GRAVITY, URINE: 1.031 — AB (ref 1.005–1.030)
pH: 5 (ref 5.0–8.0)

## 2017-08-01 LAB — HCG, QUANTITATIVE, PREGNANCY: HCG, BETA CHAIN, QUANT, S: 3 m[IU]/mL (ref <5)

## 2017-08-01 MED ORDER — CEFTRIAXONE SODIUM IN DEXTROSE 20 MG/ML IV SOLN
1.0000 g | Freq: Once | INTRAVENOUS | Status: AC
  Administered 2017-08-01: 1 g via INTRAVENOUS
  Filled 2017-08-01: qty 50

## 2017-08-01 MED ORDER — CEPHALEXIN 500 MG PO CAPS: 500.0000 mg | ORAL_CAPSULE | Freq: Three times a day (TID) | ORAL | 0 refills | Status: DC

## 2017-08-01 NOTE — ED Provider Notes (Signed)
[facility] Emergency Department Provider Note  Time seen: 12:33 AM  I have reviewed the triage vital signs and the nursing notes.   HISTORY  Chief Complaint Post-op Problem    HPI Alexandra Joseph is a 21 y.o. female With a past medical history of anemia, anxiety, depression, status post NSVD one week ago, complicated by D&C 7 days ago presents to the emergency department for low-grade fever and increased vaginal bleeding. According to the patient she has been having mild vaginal bleeding since her procedure however she states it increased somewhat tonight. She also noted a fever to 100.3 tonight. States lower abdominal cramping which has been present since her delivery but somewhat worse tonight. Denies dysuria. Denies any obvious vaginal discharge or foul odor.  Past Medical History:  Diagnosis Date  . Anemia   . Anxiety   . Costochondritis   . Depression   . Menorrhagia   . Migraine   . Tachycardia     Patient Active Problem List   Diagnosis Date Noted  . Delay postpartum hemorrhage 07/23/2017  . Normal spontaneous vaginal delivery 07/23/2017  . Oligohydramnios antepartum 07/21/2017  . Labor and delivery, indication for care 07/18/2017  . Third trimester pregnancy 07/07/2017  . Heart palpitations 11/05/2014  . Headache 10/24/2014    Past Surgical History:  Procedure Laterality Date  . ADENOIDECTOMY    . DILATION AND EVACUATION N/A 07/23/2017   Procedure: DILATATION AND EVACUATION;  Surgeon: Linzie Collin, MD;  Location: [facility] ORS;  Service: Gynecology;  Laterality: N/A;  . TONSILLECTOMY      Prior to Admission medications   Medication Sig Start Date End Date Taking? Authorizing Provider  Cholecalciferol (VITAMIN D3) 5000 units CAPS Take 1 capsule (5,000 Units total) by mouth daily. 07/25/17  Yes Shambley, Melody N, CNM  docusate sodium (COLACE) 100 MG capsule Take 1 capsule (100 mg total) by mouth daily. 07/25/17  Yes Shambley, Melody N, CNM   ibuprofen (ADVIL,MOTRIN) 600 MG tablet Take 1 tablet (600 mg total) by mouth every 6 (six) hours. 07/25/17  Yes Shambley, Melody N, CNM  Iron-FA-B Cmp-C-Biot-Probiotic (FUSION PLUS) CAPS Take 1 capsule by mouth daily. 07/25/17  Yes Shambley, Melody N, CNM  norethindrone (MICRONOR,CAMILA,ERRIN) 0.35 MG tablet Take 1 tablet (0.35 mg total) by mouth daily. 07/25/17  Yes Shambley, Melody N, CNM  Prenatal-DSS-FeCb-FeGl-FA (CITRANATAL BLOOM) 90-1 MG TABS Take 1 tablet by mouth daily. 07/25/17  Yes Shambley, Melody N, CNM    No Known Allergies  Family History  Problem Relation Age of Onset  . Hypertension Mother   . Arrhythmia Mother        palpitations  . Hyperlipidemia Mother   . Heart attack Maternal Uncle 42  . Heart attack Maternal Grandmother   . Heart disease Maternal Grandmother        CABG  . Stroke Maternal Grandmother   . Osteoarthritis Maternal Grandmother   . Hypertension Maternal Grandfather   . Stroke Maternal Grandfather     Social History Social History  Substance Use Topics  . Smoking status: Never Smoker  . Smokeless tobacco: Never Used  . Alcohol use No    Review of Systems Constitutional: low-grade fever. Cardiovascular: Negative for chest pain. Respiratory: Negative for shortness of breath. Gastrointestinal: mild lower abdominal cramping somewhat worse today. Negative for vomiting or diarrhea. Genitourinary: Negative for dysuria. Neurological: Negative for headache All other ROS negative  ____________________________________________   PHYSICAL EXAM:  VITAL SIGNS: ED Triage Vitals  Enc Vitals Group  BP 07/31/17 2236 125/68     Pulse Rate 07/31/17 2236 77     Resp 07/31/17 2236 18     Temp 07/31/17 2236 98.4 F (36.9 C)     Temp Source 07/31/17 2236 Oral     SpO2 07/31/17 2236 99 %     Weight 07/31/17 2237 150 lb (68 kg)     Height 07/31/17 2237 5' (1.524 m)     Head Circumference --      Peak Flow --      Pain Score 07/31/17 2235 7     Pain Loc  --      Pain Edu? --      Excl. in GC? --     Constitutional: Alert and oriented. Well appearing and in no distress. Eyes: Normal exam ENT   Head: Normocephalic and atraumatic.   Mouth/Throat: Mucous membranes are moist. Cardiovascular: Normal rate, regular rhythm. No murmur Respiratory: Normal respiratory effort without tachypnea nor retractions. Breath sounds are clear  Gastrointestinal: soft, mild lower abdominal tenderness palpation. No rebound or guarding. No distention. Musculoskeletal: Nontender with normal range of motion in all extremities. Neurologic:  Normal speech and language. No gross focal neurologic deficits  Skin:  Skin is warm, dry and intact.  Psychiatric: Mood and affect are normal.   ____________________________________________   RADIOLOGY  IMPRESSION: Complex endometrial thickening of 13 mm. No vascularity. Findings are nonspecific and could indicate endometrial blood products, although hypovascular retained products of conception cannot be excluded. Consider short-term follow-up pelvic ultrasound or further evaluation with pelvic MRI with and without IV contrast as clinically warranted.  ____________________________________________   INITIAL IMPRESSION / ASSESSMENT AND PLAN / ED COURSE  Pertinent labs & imaging results that were available during my care of the patient were reviewed by me and considered in my medical decision making (see chart for details).  patient presents to the emergency department for increased vaginal bleeding, low-grade fever and lower abdominal cramping. Patient's labs are largely within normal limits including a normal white blood cell count. We'll perform a pelvic examination. We'll obtain an ultrasound and discuss with OB.  overall the patient's workup most consistent with urinary tract infection. I discussed the patient with Dr. Logan Bores who recommends antibiotics and follow-up in his office. Patient is agreeable to this  plan.  ____________________________________________   FINAL CLINICAL IMPRESSION(S) / ED DIAGNOSES  abdominal cramping fever urinary tract infection   Minna Antis, MD 08/01/17 [phone]

## 2017-08-03 ENCOUNTER — Encounter: Payer: Self-pay | Admitting: Obstetrics and Gynecology

## 2017-08-03 ENCOUNTER — Ambulatory Visit (INDEPENDENT_AMBULATORY_CARE_PROVIDER_SITE_OTHER): Admitting: Obstetrics and Gynecology

## 2017-08-03 VITALS — BP 115/68 | HR 76 | Ht 63.0 in | Wt 144.1 lb

## 2017-08-03 DIAGNOSIS — N3001 Acute cystitis with hematuria: Secondary | ICD-10-CM

## 2017-08-03 NOTE — Progress Notes (Addendum)
HPI:      [name] is a 21 y.o. G1P1001 who LMP was Patient's last menstrual period was 10/16/2016 (exact date).  Subjective:   She presents today For follow-up of emergency department visit. She was seen and diagnosed with a UTI. This was giving her significant pelvic discomfort with cramping and increasing bleeding. Her ultrasound that time was not significant. She began antibiotics yesterday and is much improved today with minimal bleeding and a significant reduction and cramping. She was prescribed Keflex.    Hx: The following portions of the patient's history were reviewed and updated as appropriate:             She  has a past medical history of Anemia; Anxiety; Costochondritis; Depression; Menorrhagia; Migraine; and Tachycardia. She  does not have any pertinent problems on file. She  has a past surgical history that includes Tonsillectomy; Adenoidectomy; and Dilation and evacuation (N/A, 07/23/2017). Her family history includes Arrhythmia in her mother; Heart attack in her maternal grandmother; Heart attack (age of onset: 4) in her maternal uncle; Heart disease in her maternal grandmother; Hyperlipidemia in her mother; Hypertension in her maternal grandfather and mother; Osteoarthritis in her maternal grandmother; Stroke in her maternal grandfather and maternal grandmother. She  reports that she has never smoked. She has never used smokeless tobacco. She reports that she does not drink alcohol or use drugs. She has No Known Allergies.       Review of Systems:  Review of Systems  Constitutional: Denied constitutional symptoms, night sweats, recent illness, fatigue, fever, insomnia and weight loss.  Eyes: Denied eye symptoms, eye pain, photophobia, vision change and visual disturbance.  Ears/Nose/Throat/Neck: Denied ear, nose, throat or neck symptoms, hearing loss, nasal discharge, sinus congestion and sore throat.  Cardiovascular: Denied cardiovascular symptoms, arrhythmia,  chest pain/pressure, edema, exercise intolerance, orthopnea and palpitations.  Respiratory: Denied pulmonary symptoms, asthma, pleuritic pain, productive sputum, cough, dyspnea and wheezing.  Gastrointestinal: Denied, gastro-esophageal reflux, melena, nausea and vomiting.  Genitourinary: Denied genitourinary symptoms including symptomatic vaginal discharge, pelvic relaxation issues, and urinary complaints.  Musculoskeletal: Denied musculoskeletal symptoms, stiffness, swelling, muscle weakness and myalgia.  Dermatologic: Denied dermatology symptoms, rash and scar.  Neurologic: Denied neurology symptoms, dizziness, headache, neck pain and syncope.  Psychiatric: Denied psychiatric symptoms, anxiety and depression.  Endocrine: Denied endocrine symptoms including hot flashes and night sweats.   Meds:   Current Outpatient Prescriptions on File Prior to Visit  Medication Sig Dispense Refill  . cephALEXin (KEFLEX) 500 MG capsule Take 1 capsule (500 mg total) by mouth 3 (three) times daily. 30 capsule 0  . Cholecalciferol (VITAMIN D3) 5000 units CAPS Take 1 capsule (5,000 Units total) by mouth daily. 120 capsule 2  . docusate sodium (COLACE) 100 MG capsule Take 1 capsule (100 mg total) by mouth daily. 100 capsule 0  . ibuprofen (ADVIL,MOTRIN) 600 MG tablet Take 1 tablet (600 mg total) by mouth every 6 (six) hours. 30 tablet 0  . Iron-FA-B Cmp-C-Biot-Probiotic (FUSION PLUS) CAPS Take 1 capsule by mouth daily. 60 capsule 1  . Prenatal-DSS-FeCb-FeGl-FA (CITRANATAL BLOOM) 90-1 MG TABS Take 1 tablet by mouth daily. 30 tablet 2  . norethindrone (MICRONOR,CAMILA,ERRIN) 0.35 MG tablet Take 1 tablet (0.35 mg total) by mouth daily. (Patient not taking: Reported on 08/03/2017) 1 Package 11   No current facility-administered medications on file prior to visit.     Objective:     Vitals:   08/03/17 1138  BP: 115/68  Pulse: 76  Assessment:    G1P1001 Patient Active Problem List    Diagnosis Date Noted  . Delay postpartum hemorrhage 07/23/2017  . Normal spontaneous vaginal delivery 07/23/2017  . Oligohydramnios antepartum 07/21/2017  . Labor and delivery, indication for care 07/18/2017  . Third trimester pregnancy 07/07/2017  . Heart palpitations 11/05/2014  . Headache 10/24/2014     1. Acute cystitis with hematuria     We have discussed this in detail. The possibility of retained products was also discussed in detail although I think this is much less likely. No evidence of endometritis.   Plan:            1.  Finish antibiotics as directed. Patient to return to the office if her symptoms worsen. Orders No orders of the defined types were placed in this encounter.   No orders of the defined types were placed in this encounter.     F/U  No Follow-up on file. I spent 15 minutes with this patient of which greater than 50% was spent discussing UTI, endometritis, antibiotics, future follow-up.  Elonda Husky, M.D. 08/03/2017 12:04 PM

## 2017-08-04 LAB — URINE CULTURE

## 2017-08-25 ENCOUNTER — Other Ambulatory Visit: Payer: Self-pay | Admitting: *Deleted

## 2017-08-25 ENCOUNTER — Other Ambulatory Visit: Payer: Self-pay | Admitting: Obstetrics and Gynecology

## 2017-08-25 ENCOUNTER — Encounter: Admitting: Obstetrics and Gynecology

## 2017-08-25 ENCOUNTER — Telehealth: Payer: Self-pay | Admitting: Obstetrics and Gynecology

## 2017-08-25 MED ORDER — DICLOXACILLIN SODIUM 500 MG PO CAPS: 500.0000 mg | ORAL_CAPSULE | Freq: Four times a day (QID) | ORAL | 0 refills | Status: DC

## 2017-08-25 NOTE — Telephone Encounter (Signed)
Patients mother called stating the patient has mastitis. She uses the [facility] on [address].Thanks

## 2017-09-03 ENCOUNTER — Ambulatory Visit (INDEPENDENT_AMBULATORY_CARE_PROVIDER_SITE_OTHER): Admitting: Obstetrics and Gynecology

## 2017-09-03 ENCOUNTER — Encounter: Payer: Self-pay | Admitting: Obstetrics and Gynecology

## 2017-09-03 NOTE — Progress Notes (Signed)
  Subjective:     Alexandra Joseph is a 21 y.o. female who presents for a postpartum visit. She is 6 weeks postpartum following a spontaneous vaginal delivery. I have fully reviewed the prenatal and intrapartum course. The delivery was at 39 gestational weeks. Outcome: spontaneous vaginal delivery. Anesthesia: epidural. Postpartum course has been complicated by mastitis and D&C for retained placental tissue. Baby's course has been uncomplicated. Baby is feeding by breast. Bleeding no bleeding. Bowel function is normal. Bladder function is normal. Patient is not sexually active. Contraception method is abstinence. Postpartum depression screening: negative.  The following portions of the patient's history were reviewed and updated as appropriate: allergies, current medications, past family history, past medical history, past social history, past surgical history and problem list. Depression screen PHQ 2/9 09/03/2017  Decreased Interest 0  Down, Depressed, Hopeless 0  PHQ - 2 Score 0  Altered sleeping 0  Tired, decreased energy 0  Change in appetite 0  Feeling bad or failure about yourself  0  Trouble concentrating 0  Moving slowly or fidgety/restless 0  Suicidal thoughts 0  PHQ-9 Score 0   Review of Systems A comprehensive review of systems was negative.   Objective:    BP 106/64   Pulse 63   Wt 139 lb 4.8 oz (63.2 kg)   Breastfeeding? Yes   BMI 24.68 kg/m   General:  alert, cooperative and appears stated age   Breasts:  inspection negative, no nipple discharge or bleeding, no masses or nodularity palpable  Lungs: clear to auscultation bilaterally  Heart:  regular rate and rhythm, S1, S2 normal, no murmur, click, rub or gallop  Abdomen: soft, non-tender; bowel sounds normal; no masses,  no organomegaly   Vulva:  normal  Vagina: normal vagina, no discharge, exudate, lesion, or erythema  Cervix:  multiparous appearance  Corpus: normal size, contour, position, consistency, mobility,  non-tender  Adnexa:  no mass, fullness, tenderness  Rectal Exam: Not performed.        Assessment:     6 weeks postpartum exam. Pap smear not done at today's visit.   Plan:    1. Contraception: abstinence 2. Labs obtained will follow up accordingly 3. Follow up in: 4 months or as needed.

## 2017-09-03 NOTE — Patient Instructions (Signed)
  Place postpartum visit patient instructions here.  

## 2017-09-04 LAB — CBC
HEMATOCRIT: 40.8 % (ref 34.0–46.6)
HEMOGLOBIN: 13.6 g/dL (ref 11.1–15.9)
MCH: 28.6 pg (ref 26.6–33.0)
MCHC: 33.3 g/dL (ref 31.5–35.7)
MCV: 86 fL (ref 79–97)
Platelets: 265 10*3/uL (ref 150–379)
RBC: 4.76 x10E6/uL (ref 3.77–5.28)
RDW: 14.2 % (ref 12.3–15.4)
WBC: 7.2 10*3/uL (ref 3.4–10.8)

## 2017-09-04 LAB — VITAMIN D 25 HYDROXY (VIT D DEFICIENCY, FRACTURES): VIT D 25 HYDROXY: 44.7 ng/mL (ref 30.0–100.0)

## 2017-09-04 LAB — FERRITIN: Ferritin: 28 ng/mL (ref 15–77)

## 2017-11-21 ENCOUNTER — Emergency Department
Admission: EM | Admit: 2017-11-21 | Discharge: 2017-11-21 | Disposition: A | Discharge status: Home / Self Care | Attending: Student in an Organized Health Care Education/Training Program | Admitting: Student in an Organized Health Care Education/Training Program

## 2017-11-21 ENCOUNTER — Encounter: Payer: Self-pay | Admitting: Emergency Medicine

## 2017-11-21 ENCOUNTER — Other Ambulatory Visit: Payer: Self-pay

## 2017-11-21 ENCOUNTER — Emergency Department

## 2017-11-21 DIAGNOSIS — Y999 Unspecified external cause status: Secondary | ICD-10-CM | POA: Diagnosis not present

## 2017-11-21 DIAGNOSIS — Z79899 Other long term (current) drug therapy: Secondary | ICD-10-CM | POA: Insufficient documentation

## 2017-11-21 DIAGNOSIS — Y9301 Activity, walking, marching and hiking: Secondary | ICD-10-CM | POA: Diagnosis not present

## 2017-11-21 DIAGNOSIS — Y929 Unspecified place or not applicable: Secondary | ICD-10-CM | POA: Insufficient documentation

## 2017-11-21 DIAGNOSIS — W1842XA Slipping, tripping and stumbling without falling due to stepping into hole or opening, initial encounter: Secondary | ICD-10-CM | POA: Diagnosis not present

## 2017-11-21 DIAGNOSIS — S93402A Sprain of unspecified ligament of left ankle, initial encounter: Secondary | ICD-10-CM | POA: Insufficient documentation

## 2017-11-21 DIAGNOSIS — S99912A Unspecified injury of left ankle, initial encounter: Secondary | ICD-10-CM | POA: Diagnosis present

## 2017-11-21 MED ORDER — MELOXICAM 7.5 MG PO TABS: 7.5000 mg | ORAL_TABLET | Freq: Every day | ORAL | 1 refills | Status: AC

## 2017-11-21 MED ORDER — ONDANSETRON HCL 4 MG PO TABS: 4.0000 mg | ORAL_TABLET | Freq: Three times a day (TID) | ORAL | 0 refills | Status: AC | PRN

## 2017-11-21 MED ORDER — ONDANSETRON 4 MG PO TBDP
4.0000 mg | ORAL_TABLET | Freq: Once | ORAL | Status: AC
  Administered 2017-11-21: 4 mg via ORAL
  Filled 2017-11-21: qty 1

## 2017-11-21 NOTE — ED Notes (Signed)

## 2017-11-21 NOTE — ED Provider Notes (Signed)
[facility] Emergency Department Provider Note  ____________________________________________  Time seen: Approximately 3:45 PM  I have reviewed the triage vital signs and the nursing notes.   HISTORY  Chief Complaint Ankle Pain    HPI Alexandra Joseph is a 22 y.o. female presenting to the emergency department with 10 out of 10 aching and nonradiating left ankle pain after patient stepped [place] a [place].  Patient reports that she lost her balance.  Patient did not hit her head or lose consciousness.  No skin compromise occurred.  Patient denies weakness, radiculopathy or changes in sensation.  Patient reports that she has not been comfortable bearing weight since the fall.  Patient recently had a new baby and is requesting a work note for her job at [employer].  No alleviating measures have been attempted.   Past Medical History:  Diagnosis Date  . Anemia   . Anxiety   . Costochondritis   . Depression   . Menorrhagia   . Migraine   . Tachycardia     Patient Active Problem List   Diagnosis Date Noted  . Normal spontaneous vaginal delivery 07/23/2017    Past Surgical History:  Procedure Laterality Date  . ADENOIDECTOMY    . DILATION AND EVACUATION N/A 07/23/2017   Procedure: DILATATION AND EVACUATION;  Surgeon: Linzie Collin, MD;  Location: [facility] ORS;  Service: Gynecology;  Laterality: N/A;  . TONSILLECTOMY      Prior to Admission medications   Medication Sig Start Date End Date Taking? Authorizing Provider  cephALEXin (KEFLEX) 500 MG capsule Take 1 capsule (500 mg total) by mouth 3 (three) times daily. Patient not taking: Reported on 09/03/2017 08/01/17   Minna Antis, MD  Cholecalciferol (VITAMIN D3) 5000 units CAPS Take 1 capsule (5,000 Units total) by mouth daily. Patient not taking: Reported on 09/03/2017 07/25/17   Shambley, Melody N, CNM  dicloxacillin (DYNAPEN) 500 MG capsule Take 1 capsule (500 mg total) by  mouth 4 (four) times daily. 08/25/17   Shambley, Melody N, CNM  docusate sodium (COLACE) 100 MG capsule Take 1 capsule (100 mg total) by mouth daily. Patient not taking: Reported on 09/03/2017 07/25/17   Shambley, Melody N, CNM  ibuprofen (ADVIL,MOTRIN) 600 MG tablet Take 1 tablet (600 mg total) by mouth every 6 (six) hours. Patient not taking: Reported on 09/03/2017 07/25/17   Shambley, Melody N, CNM  Iron-FA-B Cmp-C-Biot-Probiotic (FUSION PLUS) CAPS Take 1 capsule by mouth daily. Patient not taking: Reported on 09/03/2017 07/25/17   Shambley, Melody N, CNM  meloxicam (MOBIC) 7.5 MG tablet Take 1 tablet (7.5 mg total) by mouth daily for 7 days. 11/21/17 11/28/17  Orvil Feil, PA-C  norethindrone (MICRONOR,CAMILA,ERRIN) 0.35 MG tablet Take 1 tablet (0.35 mg total) by mouth daily. Patient not taking: Reported on 08/03/2017 07/25/17   Shambley, Melody N, CNM  ondansetron (ZOFRAN) 4 MG tablet Take 1 tablet (4 mg total) by mouth every 8 (eight) hours as needed for up to 3 days for nausea or vomiting. 11/21/17 11/24/17  Pia Mau M, PA-C  Prenatal-DSS-FeCb-FeGl-FA (CITRANATAL BLOOM) 90-1 MG TABS Take 1 tablet by mouth daily. 07/25/17   Purcell Nails, CNM    Allergies Patient has no known allergies.  Family History  Problem Relation Age of Onset  . Hypertension Mother   . Arrhythmia Mother        palpitations  . Hyperlipidemia Mother   . Heart attack Maternal Uncle 42  . Heart attack Maternal Grandmother   .  Heart disease Maternal Grandmother        CABG  . Stroke Maternal Grandmother   . Osteoarthritis Maternal Grandmother   . Hypertension Maternal Grandfather   . Stroke Maternal Grandfather     Social History Social History   Tobacco Use  . Smoking status: Never Smoker  . Smokeless tobacco: Never Used  Substance Use Topics  . Alcohol use: No  . Drug use: No     Review of Systems  Constitutional: No fever/chills Eyes: No visual changes. No discharge ENT: No upper respiratory  complaints. Cardiovascular: no chest pain. Respiratory: no cough. No SOB. Musculoskeletal: Patient had left ankle pain. Skin: Negative for rash, abrasions, lacerations, ecchymosis. Neurological: Negative for headaches, focal weakness or numbness.   ____________________________________________   PHYSICAL EXAM:  VITAL SIGNS: ED Triage Vitals [11/21/17 1416]  Enc Vitals Group     BP 120/71     Pulse Rate 83     Resp 18     Temp 98.5 F (36.9 C)     Temp Source Oral     SpO2 100 %     Weight 140 lb (63.5 kg)     Height 5' (1.524 m)     Head Circumference      Peak Flow      Pain Score 10     Pain Loc      Pain Edu?      Excl. in GC?      Constitutional: Alert and oriented. Well appearing and in no acute distress. Eyes: Conjunctivae are normal. PERRL. EOMI. Head: Atraumatic. Cardiovascular: Normal rate, regular rhythm. Normal S1 and S2.  Good peripheral circulation. Respiratory: Normal respiratory effort without tachypnea or retractions. Lungs CTAB. Good air entry to the bases with no decreased or absent breath sounds. Musculoskeletal: Patient is able to perform limited dorsiflexion and plantar flexion at the left ankle, likely secondary to pain.  She has pain elicited to palpation over the deltoid, anterior talofibular ligament and posterior talofibular ligament.  Patient is able to move all 5 left toes and has no pain to palpation over the metatarsals.  No pain was elicited with palpation over the course of the fibula.  Palpable dorsalis pedis pulse, left. Neurologic:  Normal speech and language. No gross focal neurologic deficits are appreciated.  Skin:  Skin is warm, dry and intact. No rash noted. Psychiatric: Mood and affect are normal. Speech and behavior are normal. Patient exhibits appropriate insight and judgement.   ____________________________________________   LABS (all labs ordered are listed, but only abnormal results are displayed)  Labs Reviewed - No data  to display ____________________________________________  EKG   ____________________________________________  RADIOLOGY Geraldo PitterI, Jujhar Everett M Azha Constantin, personally viewed and evaluated these images (plain radiographs) as part of my medical decision making, as well as reviewing the written report by the radiologist.  Dg Ankle Complete Left  Result Date: 11/21/2017 CLINICAL DATA:  22 year old female with left ankle pain after stepping in a hole and twisting her ankle are earlier this morning EXAM: LEFT ANKLE COMPLETE - 3+ VIEW COMPARISON:  None. FINDINGS: There is no evidence of fracture, dislocation, or joint effusion. There is no evidence of arthropathy or other focal bone abnormality. Mild soft tissue swelling over the lateral malleolus. IMPRESSION: Soft tissue swelling without evidence of acute fracture or malalignment. Electronically Signed   By: Malachy MoanHeath  McCullough M.D.   On: 11/21/2017 14:51    ____________________________________________    PROCEDURES  Procedure(s) performed:    Procedures    Medications  ondansetron (ZOFRAN-ODT) disintegrating tablet 4 mg (not administered)     ____________________________________________   INITIAL IMPRESSION / ASSESSMENT AND PLAN / ED COURSE  Pertinent labs & imaging results that were available during my care of the patient were reviewed by me and considered in my medical decision making (see chart for details).  Review of the [place] CSRS was performed in accordance of the [facility] prior to dispensing any controlled drugs.     Assessment and plan Left ankle sprain Patient presented to the emergency department with left ankle pain.  Differential diagnosis included left ankle sprain versus fracture.  No dislocations, fractures or acute bony abnormalities were identified on x-ray examination of the left ankle.  History and physical exam findings are consistent with a left ankle sprain.  An Ace wrap was applied in the emergency department and crutches were  provided.  Patient reports that pain has caused her to be nauseated and patient was discharged with a short course of Zofran.  Patient was also discharged with meloxicam for pain and a referral was given to podiatry.  Strict return precautions were given to return to the emergency department for new or worsening symptoms and all patient questions were answered at discharge.    ____________________________________________  FINAL CLINICAL IMPRESSION(S) / ED DIAGNOSES  Final diagnoses:  Sprain of left ankle, unspecified ligament, initial encounter      NEW MEDICATIONS STARTED DURING THIS VISIT:  ED Discharge Orders        Ordered    ondansetron (ZOFRAN) 4 MG tablet  Every 8 hours PRN     11/21/17 1527    meloxicam (MOBIC) 7.5 MG tablet  Daily     11/21/17 1527          This chart was dictated using voice recognition software/Dragon. Despite best efforts to proofread, errors can occur which can change the meaning. Any change was purely unintentional.    Orvil Feil, PA-C 11/21/17 1550    Willy Eddy, MD 11/21/17 [phone]

## 2017-11-21 NOTE — ED Triage Notes (Signed)
Pt stepped in hole today twisted left ankle

## 2018-01-06 ENCOUNTER — Encounter: Admitting: Obstetrics and Gynecology

## 2018-02-02 ENCOUNTER — Ambulatory Visit (INDEPENDENT_AMBULATORY_CARE_PROVIDER_SITE_OTHER): Admitting: Obstetrics and Gynecology

## 2018-02-02 ENCOUNTER — Encounter: Payer: Self-pay | Admitting: Obstetrics and Gynecology

## 2018-02-02 VITALS — BP 128/84 | HR 82 | Ht 60.0 in | Wt 140.8 lb

## 2018-02-02 DIAGNOSIS — Z01419 Encounter for gynecological examination (general) (routine) without abnormal findings: Secondary | ICD-10-CM

## 2018-02-02 NOTE — Progress Notes (Signed)
  Subjective:     Alexandra Joseph is a married white 22 y.o. female and is here for a comprehensive physical exam. G1P1. The patient reports problems - menses are irregular in timing, 6-8 weeks apart since delivery.  Social History   Socioeconomic History  . Marital status: Single    Spouse name: Not on file  . Number of children: Not on file  . Years of education: Not on file  . Highest education level: Not on file  Social Needs  . Financial resource strain: Not on file  . Food insecurity - worry: Not on file  . Food insecurity - inability: Not on file  . Transportation needs - medical: Not on file  . Transportation needs - non-medical: Not on file  Occupational History  . Not on file  Tobacco Use  . Smoking status: Never Smoker  . Smokeless tobacco: Never Used  Substance and Sexual Activity  . Alcohol use: No  . Drug use: No  . Sexual activity: No    Birth control/protection: None  Other Topics Concern  . Not on file  Social History Narrative  . Not on file   Health Maintenance  Topic Date Due  . CHLAMYDIA SCREENING  10/18/2012  . INFLUENZA VACCINE  06/16/2017  . TETANUS/TDAP  05/25/2027  . HIV Screening  Completed    The following portions of the patient's history were reviewed and updated as appropriate: allergies, current medications, past family history, past medical history, past social history, past surgical history and problem list.  Review of Systems Pertinent items noted in HPI and remainder of comprehensive ROS otherwise negative.   Objective:    General appearance: alert, cooperative and appears stated age Neck: no adenopathy, no carotid bruit, no JVD, supple, symmetrical, trachea midline and thyroid not enlarged, symmetric, no tenderness/mass/nodules Lungs: clear to auscultation bilaterally Breasts: normal appearance, no masses or tenderness Heart: regular rate and rhythm, S1, S2 normal, no murmur, click, rub or gallop Abdomen: soft, non-tender;  bowel sounds normal; no masses,  no organomegaly Pelvic: deferred    Assessment:    Healthy female exam.  Irregular menses     Plan:  [place], will use condoms when he is home. RTC as needed.   See After Visit Summary for Counseling Recommendations

## 2018-02-03 ENCOUNTER — Encounter: Admitting: Obstetrics and Gynecology

## 2018-10-04 ENCOUNTER — Ambulatory Visit (INDEPENDENT_AMBULATORY_CARE_PROVIDER_SITE_OTHER): Admitting: Obstetrics and Gynecology

## 2018-10-04 ENCOUNTER — Encounter: Payer: Self-pay | Admitting: Obstetrics and Gynecology

## 2018-10-04 VITALS — BP 125/66 | HR 89 | Ht 60.0 in | Wt 143.3 lb

## 2018-10-04 DIAGNOSIS — Z3009 Encounter for other general counseling and advice on contraception: Secondary | ICD-10-CM | POA: Diagnosis not present

## 2018-10-04 MED ORDER — NORGESTREL-ETHINYL ESTRADIOL 0.3-30 MG-MCG PO TABS: 1.0000 | ORAL_TABLET | Freq: Every day | ORAL | 11 refills | Status: DC

## 2018-10-04 NOTE — Patient Instructions (Signed)
Oral Contraception Information Oral contraceptive pills (OCPs) are medicines taken to prevent pregnancy. OCPs work by preventing the ovaries from releasing eggs. The hormones in OCPs also cause the cervical mucus to thicken, preventing the sperm from entering the uterus. The hormones also cause the uterine lining to become thin, not allowing a fertilized egg to attach to the inside of the uterus. OCPs are highly effective when taken exactly as prescribed. However, OCPs do not prevent sexually transmitted diseases (STDs). Safe sex practices, such as using condoms along with the pill, can help prevent STDs. Before taking the pill, you may have a physical exam and Pap test. Your health care provider may order blood tests. The health care provider will make sure you are a good candidate for oral contraception. Discuss with your health care provider the possible side effects of the OCP you may be prescribed. When starting an OCP, it can take 2 to 3 months for the body to adjust to the changes in hormone levels in your body. Types of oral contraception  The combination pill-This pill contains estrogen and progestin (synthetic progesterone) hormones. The combination pill comes in 21-day, 28-day, or 91-day packs. Some types of combination pills are meant to be taken continuously (365-day pills). With 21-day packs, you do not take pills for 7 days after the last pill. With 28-day packs, the pill is taken every day. The last 7 pills are without hormones. Certain types of pills have more than 21 hormone-containing pills. With 91-day packs, the first 84 pills contain both hormones, and the last 7 pills contain no hormones or contain estrogen only.  The minipill-This pill contains the progesterone hormone only. The pill is taken every day continuously. It is very important to take the pill at the same time each day. The minipill comes in packs of 28 pills. All 28 pills contain the hormone. Advantages of oral  contraceptive pills  Decreases premenstrual symptoms.  Treats menstrual period cramps.  Regulates the menstrual cycle.  Decreases a heavy menstrual flow.  May treatacne, depending on the type of pill.  Treats abnormal uterine bleeding.  Treats polycystic ovarian syndrome.  Treats endometriosis.  Can be used as emergency contraception. Things that can make oral contraceptive pills less effective OCPs can be less effective if:  You forget to take the pill at the same time every day.  You have a stomach or intestinal disease that lessens the absorption of the pill.  You take OCPs with other medicines that make OCPs less effective, such as antibiotics, certain HIV medicines, and some seizure medicines.  You take expired OCPs.  You forget to restart the pill on day 7, when using the packs of 21 pills.  Risks associated with oral contraceptive pills Oral contraceptive pills can sometimes cause side effects, such as:  Headache.  Nausea.  Breast tenderness.  Irregular bleeding or spotting.  Combination pills are also associated with a small increased risk of:  Blood clots.  Heart attack.  Stroke.  This information is not intended to replace advice given to you by your health care provider. Make sure you discuss any questions you have with your health care provider. Document Released: 01/23/2003 Document Revised: 04/09/2016 Document Reviewed: 04/23/2013 Elsevier Interactive Patient Education  2018 Elsevier Inc[employer]  

## 2018-10-04 NOTE — Progress Notes (Signed)
  Subjective:     Patient ID: Lind GuestMartina N Burkholder, female   DOB: 09/06/1996, 22 y.o.   MRN: [id number]  HPI  [facility]. LMP 11/1-11/5 with normal flow, but can skip months inbewtween.  Review of Systems  All other systems reviewed and are negative.      Objective:   Physical Exam  A&Ox4 Well groomed female in no distress Blood pressure 125/66, pulse 89, height 5' (1.524 m), weight 143 lb 4.8 oz (65 kg), last menstrual period 09/16/2018, not currently breastfeeding. PE not indicated     Assessment:     New start OCPs     Plan:     Will start LoOvral today and instructed on use, side effects and expected outcomes.  RTC as needed.   Charitie Hinote,CNM

## 2019-02-09 ENCOUNTER — Encounter: Admitting: Obstetrics and Gynecology

## 2019-08-04 ENCOUNTER — Ambulatory Visit (INDEPENDENT_AMBULATORY_CARE_PROVIDER_SITE_OTHER): Payer: Medicaid Other | Admitting: Obstetrics and Gynecology

## 2019-08-04 ENCOUNTER — Encounter: Payer: Self-pay | Admitting: Obstetrics and Gynecology

## 2019-08-04 ENCOUNTER — Other Ambulatory Visit: Payer: Self-pay

## 2019-08-04 VITALS — BP 114/61 | HR 75 | Ht 60.0 in | Wt 145.1 lb

## 2019-08-04 DIAGNOSIS — Z6828 Body mass index (BMI) 28.0-28.9, adult: Secondary | ICD-10-CM | POA: Diagnosis not present

## 2019-08-04 DIAGNOSIS — O219 Vomiting of pregnancy, unspecified: Secondary | ICD-10-CM | POA: Diagnosis not present

## 2019-08-04 DIAGNOSIS — N926 Irregular menstruation, unspecified: Secondary | ICD-10-CM

## 2019-08-04 LAB — POCT URINE PREGNANCY: Preg Test, Ur: POSITIVE — AB

## 2019-08-04 NOTE — Patient Instructions (Signed)
First Trimester of Pregnancy  The first trimester of pregnancy is from week 1 until the end of week 13 (months 1 through 3). During this time, your baby will begin to develop inside you. At 6-8 weeks, the eyes and face are formed, and the heartbeat can be seen on ultrasound. At the end of 12 weeks, all the baby's organs are formed. Prenatal care is all the medical care you receive before the birth of your baby. Make sure you get good prenatal care and follow all of your doctor's instructions. Follow these instructions at home: Medicines  Take over-the-counter and prescription medicines only as told by your doctor. Some medicines are safe and some medicines are not safe during pregnancy.  Take a prenatal vitamin that contains at least 600 micrograms (mcg) of folic acid.  If you have trouble pooping (constipation), take medicine that will make your stool soft (stool softener) if your doctor approves. Eating and drinking   Eat regular, healthy meals.  Your doctor will tell you the amount of weight gain that is right for you.  Avoid raw meat and uncooked cheese.  If you feel sick to your stomach (nauseous) or throw up (vomit): ? Eat 4 or 5 small meals a day instead of 3 large meals. ? Try eating a few soda crackers. ? Drink liquids between meals instead of during meals.  To prevent constipation: ? Eat foods that are high in fiber, like fresh fruits and vegetables, whole grains, and beans. ? Drink enough fluids to keep your pee (urine) clear or pale yellow. Activity  Exercise only as told by your doctor. Stop exercising if you have cramps or pain in your lower belly (abdomen) or low back.  Do not exercise if it is too hot, too humid, or if you are in a place of great height (high altitude).  Try to avoid standing for long periods of time. Move your legs often if you must stand in one place for a long time.  Avoid heavy lifting.  Wear low-heeled shoes. Sit and stand up straight.   You can have sex unless your doctor tells you not to. Relieving pain and discomfort  Wear a good support bra if your breasts are sore.  Take warm water baths (sitz baths) to soothe pain or discomfort caused by hemorrhoids. Use hemorrhoid cream if your doctor says it is okay.  Rest with your legs raised if you have leg cramps or low back pain.  If you have puffy, bulging veins (varicose veins) in your legs: ? Wear support hose or compression stockings as told by your doctor. ? Raise (elevate) your feet for 15 minutes, 3-4 times a day. ? Limit salt in your food. Prenatal care  Schedule your prenatal visits by the twelfth week of pregnancy.  Write down your questions. Take them to your prenatal visits.  Keep all your prenatal visits as told by your doctor. This is important. Safety  Wear your seat belt at all times when driving.  Make a list of emergency phone numbers. The list should include numbers for family, friends, the hospital, and police and fire departments. General instructions  Ask your doctor for a referral to a local prenatal class. Begin classes no later than at the start of month 6 of your pregnancy.  Ask for help if you need counseling or if you need help with nutrition. Your doctor can give you advice or tell you where to go for help.  Do not use hot tubs, steam  rooms, or saunas.  Do not douche or use tampons or scented sanitary pads.  Do not cross your legs for long periods of time.  Avoid all herbs and alcohol. Avoid drugs that are not approved by your doctor.  Do not use any tobacco products, including cigarettes, chewing tobacco, and electronic cigarettes. If you need help quitting, ask your doctor. You may get counseling or other support to help you quit.  Avoid cat litter boxes and soil used by cats. These carry germs that can cause birth defects in the baby and can cause a loss of your baby (miscarriage) or stillbirth.  Visit your dentist. At home,  brush your teeth with a soft toothbrush. Be gentle when you floss. Contact a doctor if:  You are dizzy.  You have mild cramps or pressure in your lower belly.  You have a nagging pain in your belly area.  You continue to feel sick to your stomach, you throw up, or you have watery poop (diarrhea).  You have a bad smelling fluid coming from your vagina.  You have pain when you pee (urinate).  You have increased puffiness (swelling) in your face, hands, legs, or ankles. Get help right away if:  You have a fever.  You are leaking fluid from your vagina.  You have spotting or bleeding from your vagina.  You have very bad belly cramping or pain.  You gain or lose weight rapidly.  You throw up blood. It may look like coffee grounds.  You are around people who have [place] measles, fifth disease, or chickenpox.  You have a very bad headache.  You have shortness of breath.  You have any kind of trauma, such as from a fall or a car accident. Summary  The first trimester of pregnancy is from week 1 until the end of week 13 (months 1 through 3).  To take care of yourself and your unborn baby, you will need to eat healthy meals, take medicines only if your doctor tells you to do so, and do activities that are safe for you and your baby.  Keep all follow-up visits as told by your doctor. This is important as your doctor will have to ensure that your baby is healthy and growing well. This information is not intended to replace advice given to you by your health care provider. Make sure you discuss any questions you have with your health care provider. Document Released: 04/20/2008 Document Revised: 02/23/2019 Document Reviewed: 11/10/2016 Elsevier Patient Education  2020 [employer]. Commonly Asked Questions During Pregnancy  Cats: A parasite can be excreted in cat feces.  To avoid exposure you need to have another person empty the little box.  If you must empty the litter box you  will need to wear gloves.  Wash your hands after handling your cat.  This parasite can also be found in raw or undercooked meat so this should also be avoided.  Colds, Sore Throats, Flu: Please check your medication sheet to see what you can take for symptoms.  If your symptoms are unrelieved by these medications please call the office.  Dental Work: Most any dental work Investment banker, corporate recommends is permitted.  X-rays should only be taken during the first trimester if absolutely necessary.  Your abdomen should be shielded with a lead apron during all x-rays.  Please notify your provider prior to receiving any x-rays.  Novocaine is fine; gas is not recommended.  If your dentist requires a note from [place] prior to  dental work please call the office and we will provide one for you.  Exercise: Exercise is an important part of staying healthy during your pregnancy.  You may continue most exercises you were accustomed to prior to pregnancy.  Later in your pregnancy you will most likely notice you have difficulty with activities requiring balance like riding a bicycle.  It is important that you listen to your body and avoid activities that put you at a higher risk of falling.  Adequate rest and staying well hydrated are a must!  If you have questions about the safety of specific activities ask your provider.    Exposure to Children with illness: Try to avoid obvious exposure; report any symptoms to [place] when noted,  If you have chicken pos, red measles or mumps, you should be immune to these diseases.   Please do not take any vaccines while pregnant unless you have checked with your OB provider.  Fetal Movement: After 28 weeks we recommend you do "kick counts" twice daily.  Lie or sit down in a calm quiet environment and count your baby movements "kicks".  You should feel your baby at least 10 times per hour.  If you have not felt 10 kicks within the first hour get up, walk around and have something sweet to eat or  drink then repeat for an additional hour.  If count remains less than 10 per hour notify your provider.  Fumigating: Follow your pest control agent's advice as to how long to stay out of your home.  Ventilate the area well before re-entering.  Hemorrhoids:   Most over-the-counter preparations can be used during pregnancy.  Check your medication to see what is safe to use.  It is important to use a stool softener or fiber in your diet and to drink lots of liquids.  If hemorrhoids seem to be getting worse please call the office.   Hot Tubs:  Hot tubs Jacuzzis and saunas are not recommended while pregnant.  These increase your internal body temperature and should be avoided.  Intercourse:  Sexual intercourse is safe during pregnancy as long as you are comfortable, unless otherwise advised by your provider.  Spotting may occur after intercourse; report any bright red bleeding that is heavier than spotting.  Labor:  If you know that you are in labor, please go to the hospital.  If you are unsure, please call the office and let us help you decide what to do.  Lifting, straining, etc:  If your job requires heavy lifting or straining please check with your provider for any limitations.  Generally, you should not lift items heavier than that you can lift simply with your hands and arms (no back muscles)  Painting:  Paint fumes do not harm your pregnancy, but may make you ill and should be avoided if possible.  Latex or water based paints have less odor than oils.  Use adequate ventilation while painting.  Permanents & Hair Color:  Chemicals in hair dyes are not recommended as they cause increase hair dryness which can increase hair loss during pregnancy.  " Highlighting" and permanents are allowed.  Dye may be absorbed differently and permanents may not hold as well during pregnancy.  Sunbathing:  Use a sunscreen, as skin burns easily during pregnancy.  Drink plenty of fluids; avoid over heating.  Tanning  Beds:  Because their possible side effects are still unknown, tanning beds are not recommended.  Ultrasound Scans:  Routine ultrasounds are performed at approximately  20 weeks.  You will be able to see your baby's general anatomy an if you would like to know the gender this can usually be determined as well.  If it is questionable when you conceived you may also receive an ultrasound early in your pregnancy for dating purposes.  Otherwise ultrasound exams are not routinely performed unless there is a medical necessity.  Although you can request a scan we ask that you pay for it when conducted because insurance does not cover " patient request" scans.  Work: If your pregnancy proceeds without complications you may work until your due date, unless your physician or employer advises otherwise.  Round Ligament Pain/Pelvic Discomfort:  Sharp, shooting pains not associated with bleeding are fairly common, usually occurring in the second trimester of pregnancy.  They tend to be worse when standing up or when you remain standing for long periods of time.  These are the result of pressure of certain pelvic ligaments called "round ligaments".  Rest, Tylenol and heat seem to be the most effective relief.  As the womb and fetus grow, they rise out of the pelvis and the discomfort improves.  Please notify the office if your pain seems different than that described.  It may represent a more serious condition.  Common Medications Safe in Pregnancy  Acne:      Constipation:  Benzoyl Peroxide     Colace  Clindamycin      Dulcolax Suppository  Topica Erythromycin     Fibercon  Salicylic Acid      Metamucil         Miralax AVOID:        Senakot   Accutane    Cough:  Retin-A       Cough Drops  Tetracycline      Phenergan w/ Codeine if Rx  Minocycline      Robitussin (Plain & DM)  Antibiotics:     Crabs/Lice:  Ceclor       RID  Cephalosporins    AVOID:  E-Mycins      Kwell  Keflex  Macrobid/Macrodantin    Diarrhea:  Penicillin      Kao-Pectate  Zithromax      Imodium AD         PUSH FLUIDS AVOID:       Cipro     Fever:  Tetracycline      Tylenol (Regular or Extra  Minocycline       Strength)  Levaquin      Extra Strength-Do not          Exceed 8 tabs/24 hrs Caffeine:        '200mg'$ /day (equiv. To 1 cup of coffee or  approx. 3 12 oz sodas)         Gas: Cold/Hayfever:       Gas-X  Benadryl      Mylicon  Claritin       Phazyme  **Claritin-D        Chlor-Trimeton    Headaches:  Dimetapp      ASA-Free Excedrin  Drixoral-Non-Drowsy     Cold Compress  Mucinex (Guaifenasin)     Tylenol (Regular or Extra  Sudafed/Sudafed-12 Hour     Strength)  **Sudafed PE Pseudoephedrine   Tylenol Cold & Sinus     Vicks Vapor Rub  Zyrtec  **AVOID if Problems With Blood Pressure         Heartburn: Avoid lying down for at least 1 hour after meals  Aciphex  Maalox     Rash:  Milk of Magnesia     Benadryl    Mylanta       1% Hydrocortisone Cream  Pepcid  Pepcid Complete   Sleep Aids:  Prevacid      Ambien   Prilosec       Benadryl  Rolaids       Chamomile Tea  Tums (Limit 4/day)     Unisom  Zantac       Tylenol PM         Warm milk-add vanilla or  Hemorrhoids:       Sugar for taste  Anusol/Anusol H.C.  (RX: Analapram 2.5%)  Sugar Substitutes:  Hydrocortisone OTC     Ok in moderation  Preparation H      Tucks        Vaseline lotion applied to tissue with wiping    Herpes:     Throat:  Acyclovir      Oragel  Famvir  Valtrex     Vaccines:         Flu Shot Leg Cramps:       *Gardasil  Benadryl      Hepatitis A         Hepatitis B Nasal Spray:       Pneumovax  Saline Nasal Spray     Polio Booster         Tetanus Nausea:       Tuberculosis test or PPD  Vitamin B6 25 mg TID   AVOID:    Dramamine      *Gardasil  Emetrol       Live Poliovirus  Ginger Root 250 mg QID    MMR (measles, mumps &  High Complex Carbs @ Bedtime    rebella)  Sea Bands-Accupressure    Varicella  (Chickenpox)  Unisom 1/2 tab TID     *No known complications           If received before Pain:         Known pregnancy;   Darvocet       Resume series after  Lortab        Delivery  Percocet    Yeast:   Tramadol      Femstat  Tylenol 3      Gyne-lotrimin  Ultram       Monistat  Vicodin           MISC:         All Sunscreens           Hair Coloring/highlights          Insect Repellant's          (Including DEET)         Mystic Tans

## 2019-08-04 NOTE — Progress Notes (Signed)
  Subjective:     Patient ID: Alexandra Joseph, female   DOB: [birth year], 23 y.o.   MRN: [id number]  HPI Here for pregnancy confirmation, reporting LMP 06/24/2019. Gives [facility] 03/30/2020 & EGA [redacted]w[redacted]d. This is her second pregnancy. Had missed pills as insurance ran out and hadn't picked it up yet. Had home UPT + this week. Reports nausea with vomiting for a week. Breast are tender, and is fatigued.    Review of Systems  All other systems reviewed and are negative.      Objective:   Physical Exam A&Ox4 Well groomed female in no distress Blood pressure 114/61, pulse 75, height 5' (1.524 m), weight 145 lb 1.6 oz (65.8 kg), last menstrual period 06/24/2019.  Body mass index is 28.34 kg/m.  UPT+    Assessment:     Missed menses BMI 28 Nausea and vomiting H/o PIH    Plan:    samples of bongesta given Congratulated on pregnancy Counseled on routine prenatal care and first trimester of pregnancy. RTC in 2 weeks for viability scan and nurse intake/labs, then 6 weeks for new OB PE with me.   Melody Shambley,CNM

## 2019-08-15 ENCOUNTER — Telehealth: Payer: Self-pay | Admitting: Obstetrics and Gynecology

## 2019-08-15 ENCOUNTER — Other Ambulatory Visit: Payer: Self-pay | Admitting: *Deleted

## 2019-08-15 MED ORDER — ONDANSETRON 4 MG PO TBDP: 4.0000 mg | ORAL_TABLET | Freq: Three times a day (TID) | ORAL | 2 refills | Status: DC | PRN

## 2019-08-15 NOTE — Progress Notes (Unsigned)
      Rosario Adie presents for NOB nurse intake visit. Pregnancy confirmation done at [facility], 08/04/19, with Melody Shambley.  G2.  P1001.  LMP 06/24/19.  EDD.  Ga. Pregnancy education material explained and given.  0 cats in the home.  NOB labs ordered. BMI less than 30. TSH/HbgA1c not ordered. Sickle cell not order due to race. HIV and drug screen explained and ordered/declined. Genetic screening discussed. Genetic testing; Unsure. Pt to discuss genetic testing with provider. PNV encouraged. Pt to follow up with provider in 4 weeks for NOB physical.  [facility] Financial Policy and FLMA forms reviewed and signed by patient.

## 2019-08-15 NOTE — Patient Instructions (Signed)
WHAT OB PATIENTS CAN EXPECT   Confirmation of pregnancy and ultrasound ordered if medically indicated-[redacted] weeks gestation  New OB (NOB) intake with nurse and New OB (NOB) labs- [redacted] weeks gestation  New OB (NOB) physical examination with provider- 11/[redacted] weeks gestation  Flu vaccine-[redacted] weeks gestation  Anatomy scan-[redacted] weeks gestation  Glucose tolerance test, blood work to test for anemia, T-dap vaccine-[redacted] weeks gestation  Vaginal swabs/cultures-STD/Group B strep-[redacted] weeks gestation  Appointments every 4 weeks until 28 weeks  Every 2 weeks from 28 weeks until 36 weeks  Weekly visits from 36 weeks until delivery  How a Baby Grows During Pregnancy  Pregnancy begins when a female's sperm enters a female's egg (fertilization). Fertilization usually happens in one of the tubes (fallopian tubes) that connect the ovaries to the womb (uterus). The fertilized egg moves down the fallopian tube to the uterus. Once it reaches the uterus, it implants into the lining of the uterus and begins to grow. For the first 10 weeks, the fertilized egg is called an embryo. After 10 weeks, it is called a fetus. As the fetus continues to grow, it receives oxygen and nutrients through tissue (placenta) that grows to support the developing baby. The placenta is the life support system for the baby. It provides oxygen and nutrition and removes waste. Learning as much as you can about your pregnancy and how your baby is developing can help you enjoy the experience. It can also make you aware of when there might be a problem and when to ask questions. How long does a typical pregnancy last? A pregnancy usually lasts 280 days, or about 40 weeks. Pregnancy is divided into three periods of growth, also called trimesters:  First trimester: 0-12 weeks.  Second trimester: 13-27 weeks.  Third trimester: 28-40 weeks. The day when your baby is ready to be born (full term) is your estimated date of delivery. How does my baby  develop month by month? First month  The fertilized egg attaches to the inside of the uterus.  Some cells will form the placenta. Others will form the fetus.  The arms, legs, brain, spinal cord, lungs, and heart begin to develop.  At the end of the first month, the heart begins to beat. Second month  The bones, inner ear, eyelids, hands, and feet form.  The genitals develop.  By the end of 8 weeks, all major organs are developing. Third month  All of the internal organs are forming.  Teeth develop below the gums.  Bones and muscles begin to grow. The spine can flex.  The skin is transparent.  Fingernails and toenails begin to form.  Arms and legs continue to grow longer, and hands and feet develop.  The fetus is about 3 inches (7.6 cm) long. Fourth month  The placenta is completely formed.  The external sex organs, neck, outer ear, eyebrows, eyelids, and fingernails are formed.  The fetus can hear, swallow, and move its arms and legs.  The kidneys begin to produce urine.  The skin is covered with a white, waxy coating (vernix) and very fine hair (lanugo). Fifth month  The fetus moves around more and can be felt for the first time (quickening).  The fetus starts to sleep and wake up and may begin to suck its finger.  The nails grow to the end of the fingers.  The organ in the digestive system that makes bile (gallbladder) functions and helps to digest nutrients.  If your baby is a girl, eggs  are present in her ovaries. If your baby is a boy, testicles start to move down into his scrotum. Sixth month  The lungs are formed.  The eyes open. The brain continues to develop.  Your baby has fingerprints and toe prints. Your baby's hair grows thicker.  At the end of the second trimester, the fetus is about 9 inches (22.9 cm) long. Seventh month  The fetus kicks and stretches.  The eyes are developed enough to sense changes in light.  The hands can make a  grasping motion.  The fetus responds to sound. Eighth month  All organs and body systems are fully developed and functioning.  Bones harden, and taste buds develop. The fetus may hiccup.  Certain areas of the brain are still developing. The skull remains soft. Ninth month  The fetus gains about  lb (0.23 kg) each week.  The lungs are fully developed.  Patterns of sleep develop.  The fetus's head typically moves into a head-down position (vertex) in the uterus to prepare for birth.  The fetus weighs 6-9 lb (2.72-4.08 kg) and is 19-20 inches (48.26-50.8 cm) long. What can I do to have a healthy pregnancy and help my baby develop? General instructions  Take prenatal vitamins as directed by your health care provider. These include vitamins such as folic acid, iron, calcium, and vitamin D. They are important for healthy development.  Take medicines only as directed by your health care provider. Read labels and ask a pharmacist or your health care provider whether over-the-counter medicines, supplements, and prescription drugs are safe to take during pregnancy.  Keep all follow-up visits as directed by your health care provider. This is important. Follow-up visits include prenatal care and screening tests. How do I know if my baby is developing well? At each prenatal visit, your health care provider will do several different tests to check on your health and keep track of your baby's development. These include:  Fundal height and position. ? Your health care provider will measure your growing belly from your pubic bone to the top of the uterus using a tape measure. ? Your health care provider will also feel your belly to determine your baby's position.  Heartbeat. ? An ultrasound in the first trimester can confirm pregnancy and show a heartbeat, depending on how far along you are. ? Your health care provider will check your baby's heart rate at every prenatal visit.  Second  trimester ultrasound. ? This ultrasound checks your baby's development. It also may show your baby's gender. What should I do if I have concerns about my baby's development? Always talk with your health care provider about any concerns that you may have about your pregnancy and your baby. Summary  A pregnancy usually lasts 280 days, or about 40 weeks. Pregnancy is divided into three periods of growth, also called trimesters.  Your health care provider will monitor your baby's growth and development throughout your pregnancy.  Follow your health care provider's recommendations about taking prenatal vitamins and medicines during your pregnancy.  Talk with your health care provider if you have any concerns about your pregnancy or your developing baby. This information is not intended to replace advice given to you by your health care provider. Make sure you discuss any questions you have with your health care provider. Document Released: 04/20/2008 Document Revised: 02/23/2019 Document Reviewed: 09/15/2017 Elsevier Patient Education  2020 [place] [employer] Pregnancy  The first trimester of pregnancy is from week 1 until the  end of week 13 (months 1 through 3). During this time, your baby will begin to develop inside you. At 6-8 weeks, the eyes and face are formed, and the heartbeat can be seen on ultrasound. At the end of 12 weeks, all the baby's organs are formed. Prenatal care is all the medical care you receive before the birth of your baby. Make sure you get good prenatal care and follow all of your doctor's instructions. Follow these instructions at home: Medicines  Take over-the-counter and prescription medicines only as told by your doctor. Some medicines are safe and some medicines are not safe during pregnancy.  Take a prenatal vitamin that contains at least 600 micrograms (mcg) of folic acid.  If you have trouble pooping (constipation), take medicine that will make  your stool soft (stool softener) if your doctor approves. Eating and drinking   Eat regular, healthy meals.  Your doctor will tell you the amount of weight gain that is right for you.  Avoid raw meat and uncooked cheese.  If you feel sick to your stomach (nauseous) or throw up (vomit): ? Eat 4 or 5 small meals a day instead of 3 large meals. ? Try eating a few soda crackers. ? Drink liquids between meals instead of during meals.  To prevent constipation: ? Eat foods that are high in fiber, like fresh fruits and vegetables, whole grains, and beans. ? Drink enough fluids to keep your pee (urine) clear or pale yellow. Activity  Exercise only as told by your doctor. Stop exercising if you have cramps or pain in your lower belly (abdomen) or low back.  Do not exercise if it is too hot, too humid, or if you are in a place of great height (high altitude).  Try to avoid standing for long periods of time. Move your legs often if you must stand in one place for a long time.  Avoid heavy lifting.  Wear low-heeled shoes. Sit and stand up straight.  You can have sex unless your doctor tells you not to. Relieving pain and discomfort  Wear a good support bra if your breasts are sore.  Take warm water baths (sitz baths) to soothe pain or discomfort caused by hemorrhoids. Use hemorrhoid cream if your doctor says it is okay.  Rest with your legs raised if you have leg cramps or low back pain.  If you have puffy, bulging veins (varicose veins) in your legs: ? Wear support hose or compression stockings as told by your doctor. ? Raise (elevate) your feet for 15 minutes, 3-4 times a day. ? Limit salt in your food. Prenatal care  Schedule your prenatal visits by the twelfth week of pregnancy.  Write down your questions. Take them to your prenatal visits.  Keep all your prenatal visits as told by your doctor. This is important. Safety  Wear your seat belt at all times when  driving.  Make a list of emergency phone numbers. The list should include numbers for family, friends, the hospital, and police and fire departments. General instructions  Ask your doctor for a referral to a local prenatal class. Begin classes no later than at the start of month 6 of your pregnancy.  Ask for help if you need counseling or if you need help with nutrition. Your doctor can give you advice or tell you where to go for help.  Do not use hot tubs, steam rooms, or saunas.  Do not douche or use tampons or scented sanitary pads.  Do not cross your legs for long periods of time.  Avoid all herbs and alcohol. Avoid drugs that are not approved by your doctor.  Do not use any tobacco products, including cigarettes, chewing tobacco, and electronic cigarettes. If you need help quitting, ask your doctor. You may get counseling or other support to help you quit.  Avoid cat litter boxes and soil used by cats. These carry germs that can cause birth defects in the baby and can cause a loss of your baby (miscarriage) or stillbirth.  Visit your dentist. At home, brush your teeth with a soft toothbrush. Be gentle when you floss. Contact a doctor if:  You are dizzy.  You have mild cramps or pressure in your lower belly.  You have a nagging pain in your belly area.  You continue to feel sick to your stomach, you throw up, or you have watery poop (diarrhea).  You have a bad smelling fluid coming from your vagina.  You have pain when you pee (urinate).  You have increased puffiness (swelling) in your face, hands, legs, or ankles. Get help right away if:  You have a fever.  You are leaking fluid from your vagina.  You have spotting or bleeding from your vagina.  You have very bad belly cramping or pain.  You gain or lose weight rapidly.  You throw up blood. It may look like coffee grounds.  You are around people who have [place] measles, fifth disease, or chickenpox.  You have  a very bad headache.  You have shortness of breath.  You have any kind of trauma, such as from a fall or a car accident. Summary  The first trimester of pregnancy is from week 1 until the end of week 13 (months 1 through 3).  To take care of yourself and your unborn baby, you will need to eat healthy meals, take medicines only if your doctor tells you to do so, and do activities that are safe for you and your baby.  Keep all follow-up visits as told by your doctor. This is important as your doctor will have to ensure that your baby is healthy and growing well. This information is not intended to replace advice given to you by your health care provider. Make sure you discuss any questions you have with your health care provider. Document Released: 04/20/2008 Document Revised: 02/23/2019 Document Reviewed: 11/10/2016 Elsevier Patient Education  2020 [employer]. Commonly Asked Questions During Pregnancy  Cats: A parasite can be excreted in cat feces.  To avoid exposure you need to have another person empty the little box.  If you must empty the litter box you will need to wear gloves.  Wash your hands after handling your cat.  This parasite can also be found in raw or undercooked meat so this should also be avoided.  Colds, Sore Throats, Flu: Please check your medication sheet to see what you can take for symptoms.  If your symptoms are unrelieved by these medications please call the office.  Dental Work: Most any dental work Investment banker, corporate recommends is permitted.  X-rays should only be taken during the first trimester if absolutely necessary.  Your abdomen should be shielded with a lead apron during all x-rays.  Please notify your provider prior to receiving any x-rays.  Novocaine is fine; gas is not recommended.  If your dentist requires a note from [place] prior to dental work please call the office and we will provide one for you.  Exercise: Exercise  is an important part of staying healthy  during your pregnancy.  You may continue most exercises you were accustomed to prior to pregnancy.  Later in your pregnancy you will most likely notice you have difficulty with activities requiring balance like riding a bicycle.  It is important that you listen to your body and avoid activities that put you at a higher risk of falling.  Adequate rest and staying well hydrated are a must!  If you have questions about the safety of specific activities ask your provider.    Exposure to Children with illness: Try to avoid obvious exposure; report any symptoms to [place] when noted,  If you have chicken pos, red measles or mumps, you should be immune to these diseases.   Please do not take any vaccines while pregnant unless you have checked with your OB provider.  Fetal Movement: After 28 weeks we recommend you do "kick counts" twice daily.  Lie or sit down in a calm quiet environment and count your baby movements "kicks".  You should feel your baby at least 10 times per hour.  If you have not felt 10 kicks within the first hour get up, walk around and have something sweet to eat or drink then repeat for an additional hour.  If count remains less than 10 per hour notify your provider.  Fumigating: Follow your pest control agent's advice as to how long to stay out of your home.  Ventilate the area well before re-entering.  Hemorrhoids:   Most over-the-counter preparations can be used during pregnancy.  Check your medication to see what is safe to use.  It is important to use a stool softener or fiber in your diet and to drink lots of liquids.  If hemorrhoids seem to be getting worse please call the office.   Hot Tubs:  Hot tubs Jacuzzis and saunas are not recommended while pregnant.  These increase your internal body temperature and should be avoided.  Intercourse:  Sexual intercourse is safe during pregnancy as long as you are comfortable, unless otherwise advised by your provider.  Spotting may occur after  intercourse; report any bright red bleeding that is heavier than spotting.  Labor:  If you know that you are in labor, please go to the hospital.  If you are unsure, please call the office and let us help you decide what to do.  Lifting, straining, etc:  If your job requires heavy lifting or straining please check with your provider for any limitations.  Generally, you should not lift items heavier than that you can lift simply with your hands and arms (no back muscles)  Painting:  Paint fumes do not harm your pregnancy, but may make you ill and should be avoided if possible.  Latex or water based paints have less odor than oils.  Use adequate ventilation while painting.  Permanents & Hair Color:  Chemicals in hair dyes are not recommended as they cause increase hair dryness which can increase hair loss during pregnancy.  " Highlighting" and permanents are allowed.  Dye may be absorbed differently and permanents may not hold as well during pregnancy.  Sunbathing:  Use a sunscreen, as skin burns easily during pregnancy.  Drink plenty of fluids; avoid over heating.  Tanning Beds:  Because their possible side effects are still unknown, tanning beds are not recommended.  Ultrasound Scans:  Routine ultrasounds are performed at approximately 20 weeks.  You will be able to see your baby's general anatomy an if you  would like to know the gender this can usually be determined as well.  If it is questionable when you conceived you may also receive an ultrasound early in your pregnancy for dating purposes.  Otherwise ultrasound exams are not routinely performed unless there is a medical necessity.  Although you can request a scan we ask that you pay for it when conducted because insurance does not cover " patient request" scans.  Work: If your pregnancy proceeds without complications you may work until your due date, unless your physician or employer advises otherwise.  Round Ligament Pain/Pelvic Discomfort:   Sharp, shooting pains not associated with bleeding are fairly common, usually occurring in the second trimester of pregnancy.  They tend to be worse when standing up or when you remain standing for long periods of time.  These are the result of pressure of certain pelvic ligaments called "round ligaments".  Rest, Tylenol and heat seem to be the most effective relief.  As the womb and fetus grow, they rise out of the pelvis and the discomfort improves.  Please notify the office if your pain seems different than that described.  It may represent a more serious condition.  Common Medications Safe in Pregnancy  Acne:      Constipation:  Benzoyl Peroxide     Colace  Clindamycin      Dulcolax Suppository  Topica Erythromycin     Fibercon  Salicylic Acid      Metamucil         Miralax AVOID:        Senakot   Accutane    Cough:  Retin-A       Cough Drops  Tetracycline      Phenergan w/ Codeine if Rx  Minocycline      Robitussin (Plain & DM)  Antibiotics:     Crabs/Lice:  Ceclor       RID  Cephalosporins    AVOID:  E-Mycins      Kwell  Keflex  Macrobid/Macrodantin   Diarrhea:  Penicillin      Kao-Pectate  Zithromax      Imodium AD         PUSH FLUIDS AVOID:       Cipro     Fever:  Tetracycline      Tylenol (Regular or Extra  Minocycline       Strength)  Levaquin      Extra Strength-Do not          Exceed 8 tabs/24 hrs Caffeine:        <250m/day (equiv. To 1 cup of coffee or  approx. 3 12 oz sodas)         Gas: Cold/Hayfever:       Gas-X  Benadryl      Mylicon  Claritin       Phazyme  **Claritin-D        Chlor-Trimeton    Headaches:  Dimetapp      ASA-Free Excedrin  Drixoral-Non-Drowsy     Cold Compress  Mucinex (Guaifenasin)     Tylenol (Regular or Extra  Sudafed/Sudafed-12 Hour     Strength)  **Sudafed PE Pseudoephedrine   Tylenol Cold & Sinus     Vicks Vapor Rub  Zyrtec  **AVOID if Problems With Blood Pressure         Heartburn: Avoid lying down for at least 1 hour  after meals  Aciphex      Maalox     Rash:  Milk of Magnesia  Benadryl    Mylanta       1% Hydrocortisone Cream  Pepcid  Pepcid Complete   Sleep Aids:  Prevacid      Ambien   Prilosec       Benadryl  Rolaids       Chamomile Tea  Tums (Limit 4/day)     Unisom  Zantac       Tylenol PM         Warm milk-add vanilla or  Hemorrhoids:       Sugar for taste  Anusol/Anusol H.C.  (RX: Analapram 2.5%)  Sugar Substitutes:  Hydrocortisone OTC     Ok in moderation  Preparation H      Tucks        Vaseline lotion applied to tissue with wiping    Herpes:     Throat:  Acyclovir      Oragel  Famvir  Valtrex     Vaccines:         Flu Shot Leg Cramps:       *Gardasil  Benadryl      Hepatitis A         Hepatitis B Nasal Spray:       Pneumovax  Saline Nasal Spray     Polio Booster         Tetanus Nausea:       Tuberculosis test or PPD  Vitamin B6 25 mg TID   AVOID:    Dramamine      *Gardasil  Emetrol       Live Poliovirus  Ginger Root 250 mg QID    MMR (measles, mumps &  High Complex Carbs @ Bedtime    rebella)  Sea Bands-Accupressure    Varicella (Chickenpox)  Unisom 1/2 tab TID     *No known complications           If received before Pain:         Known pregnancy;   Darvocet       Resume series after  Lortab        Delivery  Percocet    Yeast:   Tramadol      Femstat  Tylenol 3      Gyne-lotrimin  Ultram       Monistat  Vicodin           MISC:         All Sunscreens           Hair Coloring/highlights          Insect Repellant's          (Including DEET)         Mystic Tans

## 2019-08-15 NOTE — Telephone Encounter (Signed)
The patients mother called and stated that the patient is vomiting and not keeping any food or fluids down. The patient is extremely dizzy and has a low BP as well. Patients mother stated that the patient needs something  Called into her pharmacy as soon as possible. Please advise.

## 2019-08-15 NOTE — Telephone Encounter (Signed)
Sent pt in some zofran sent pt mcm-ac

## 2019-08-17 ENCOUNTER — Ambulatory Visit (INDEPENDENT_AMBULATORY_CARE_PROVIDER_SITE_OTHER): Payer: Medicaid Other

## 2019-08-17 ENCOUNTER — Other Ambulatory Visit: Payer: Self-pay

## 2019-08-17 ENCOUNTER — Ambulatory Visit: Payer: Medicaid Other | Admitting: Obstetrics and Gynecology

## 2019-08-17 VITALS — BP 117/70 | HR 80 | Ht 60.0 in | Wt 142.3 lb

## 2019-08-17 DIAGNOSIS — Z3481 Encounter for supervision of other normal pregnancy, first trimester: Secondary | ICD-10-CM

## 2019-08-17 DIAGNOSIS — Z113 Encounter for screening for infections with a predominantly sexual mode of transmission: Secondary | ICD-10-CM

## 2019-08-17 DIAGNOSIS — Z3687 Encounter for antenatal screening for uncertain dates: Secondary | ICD-10-CM | POA: Diagnosis not present

## 2019-08-17 DIAGNOSIS — Z0283 Encounter for blood-alcohol and blood-drug test: Secondary | ICD-10-CM

## 2019-08-17 DIAGNOSIS — N926 Irregular menstruation, unspecified: Secondary | ICD-10-CM

## 2019-08-17 LAB — OB RESULTS CONSOLE VARICELLA ZOSTER ANTIBODY, IGG: Varicella: IMMUNE

## 2019-08-17 LAB — OB RESULTS CONSOLE GC/CHLAMYDIA: Gonorrhea: NEGATIVE

## 2019-08-18 LAB — NICOTINE SCREEN, URINE: Cotinine Ql Scrn, Ur: NEGATIVE ng/mL

## 2019-08-18 LAB — URINALYSIS, ROUTINE W REFLEX MICROSCOPIC
Bilirubin, UA: NEGATIVE
Glucose, UA: NEGATIVE
Ketones, UA: NEGATIVE
Nitrite, UA: NEGATIVE
RBC, UA: NEGATIVE
Specific Gravity, UA: 1.024 (ref 1.005–1.030)
Urobilinogen, Ur: 1 mg/dL (ref 0.2–1.0)
pH, UA: 6 (ref 5.0–7.5)

## 2019-08-18 LAB — TOXOPLASMA ANTIBODIES- IGG AND  IGM
Toxoplasma Antibody- IgM: <3 AU/mL (ref 0.0–7.9)
Toxoplasma IgG Ratio: <3 IU/mL (ref 0.0–7.1)

## 2019-08-18 LAB — ABO AND RH: Rh Factor: POSITIVE

## 2019-08-18 LAB — DRUG PROFILE, UR, 9 DRUGS (LABCORP)
Amphetamines, Urine: NEGATIVE ng/mL
Barbiturate Quant, Ur: NEGATIVE ng/mL
Benzodiazepine Quant, Ur: NEGATIVE ng/mL
Cannabinoid Quant, Ur: NEGATIVE ng/mL
Cocaine (Metab.): NEGATIVE ng/mL
Methadone Screen, Urine: NEGATIVE ng/mL
Opiate Quant, Ur: NEGATIVE ng/mL
PCP Quant, Ur: NEGATIVE ng/mL
Propoxyphene: NEGATIVE ng/mL

## 2019-08-18 LAB — MICROSCOPIC EXAMINATION
Casts: NONE SEEN /lpf
Epithelial Cells (non renal): >10 /hpf — AB (ref 0–10)

## 2019-08-18 LAB — VARICELLA ZOSTER ANTIBODY, IGG: Varicella zoster IgG: 815 index (ref >165)

## 2019-08-18 LAB — HEPATITIS B SURFACE ANTIGEN: Hepatitis B Surface Ag: NEGATIVE

## 2019-08-18 LAB — RPR: RPR Ser Ql: NONREACTIVE

## 2019-08-18 LAB — HIV ANTIBODY (ROUTINE TESTING W REFLEX): HIV Screen 4th Generation wRfx: NONREACTIVE

## 2019-08-18 LAB — ANTIBODY SCREEN: Antibody Screen: NEGATIVE

## 2019-08-18 LAB — RUBELLA SCREEN: Rubella Antibodies, IGG: 1.69 index (ref >0.99)

## 2019-08-19 LAB — GC/CHLAMYDIA PROBE AMP
Chlamydia trachomatis, NAA: NEGATIVE
Neisseria Gonorrhoeae by PCR: NEGATIVE

## 2019-08-19 LAB — URINE CULTURE, OB REFLEX

## 2019-08-19 LAB — CULTURE, OB URINE

## 2019-08-24 ENCOUNTER — Telehealth: Payer: Self-pay | Admitting: Obstetrics and Gynecology

## 2019-08-24 NOTE — Telephone Encounter (Signed)
The patient called and stated that she is to get her flu shot after she is [redacted] wks gestational age. Patient is requesting a note from her provider for her employer stating that so she is able to work. Please advise.

## 2019-09-13 ENCOUNTER — Ambulatory Visit (INDEPENDENT_AMBULATORY_CARE_PROVIDER_SITE_OTHER): Payer: Medicaid Other | Admitting: Obstetrics and Gynecology

## 2019-09-13 ENCOUNTER — Other Ambulatory Visit (HOSPITAL_COMMUNITY)
Admission: RE | Admit: 2019-09-13 | Discharge: 2019-09-13 | Disposition: A | Discharge status: Home / Self Care | Payer: Medicaid Other | Source: Ambulatory Visit | Attending: Obstetrics and Gynecology | Admitting: Obstetrics and Gynecology

## 2019-09-13 ENCOUNTER — Other Ambulatory Visit: Payer: Self-pay

## 2019-09-13 ENCOUNTER — Encounter: Payer: Self-pay | Admitting: Obstetrics and Gynecology

## 2019-09-13 ENCOUNTER — Other Ambulatory Visit: Payer: Self-pay | Admitting: Obstetrics and Gynecology

## 2019-09-13 VITALS — BP 102/56 | HR 80 | Wt 142.0 lb

## 2019-09-13 DIAGNOSIS — Z23 Encounter for immunization: Secondary | ICD-10-CM | POA: Diagnosis not present

## 2019-09-13 DIAGNOSIS — Z3491 Encounter for supervision of normal pregnancy, unspecified, first trimester: Secondary | ICD-10-CM | POA: Diagnosis not present

## 2019-09-13 DIAGNOSIS — Z3A11 11 weeks gestation of pregnancy: Secondary | ICD-10-CM

## 2019-09-13 DIAGNOSIS — K5900 Constipation, unspecified: Secondary | ICD-10-CM | POA: Diagnosis not present

## 2019-09-13 DIAGNOSIS — Z124 Encounter for screening for malignant neoplasm of cervix: Secondary | ICD-10-CM

## 2019-09-13 DIAGNOSIS — O99611 Diseases of the digestive system complicating pregnancy, first trimester: Secondary | ICD-10-CM

## 2019-09-13 DIAGNOSIS — Z130 Encounter for screening for diseases of the blood and blood-forming organs and certain disorders involving the immune mechanism: Secondary | ICD-10-CM

## 2019-09-13 MED ORDER — POLYETHYLENE GLYCOL 3350 17 GM/SCOOP PO POWD: 1.0000 | Freq: Once | ORAL | 2 refills | Status: AC

## 2019-09-13 NOTE — Progress Notes (Signed)
NEW OB HISTORY AND PHYSICAL  SUBJECTIVE:       Alexandra Joseph is a 23 y.o. [id number] female, Patient's last menstrual period was 06/24/2019., Estimated Date of Delivery: 04/01/20, [redacted]w[redacted]d, presents today for establishment of Prenatal Care. She has no unusual complaints and complains of vaginal discharge and constipation not relieved with Miralax, nausea and vomiting intermittent.      Gynecologic History Patient's last menstrual period was 06/24/2019. Normal Contraception: none Last Pap: never had one  Obstetric History OB History  Gravida Para Term Preterm AB Living  2 1 1     1   SAB TAB Ectopic Multiple Live Births          1    # Outcome Date GA Lbr Len/2nd Weight Sex Delivery Anes PTL Lv  2 Current           1 Term 07/23/17 [redacted]w[redacted]d  7 lb 10 oz (3.459 kg) M  EPI N LIV     Complications: Other Excessive Bleeding    Obstetric Comments  Retained placenta and D&C 1 day PP, anemia resulted with blood transfused.    Past Medical History:  Diagnosis Date  . Anemia   . Anxiety   . Costochondritis   . Depression   . Menorrhagia   . Migraine   . Tachycardia     Past Surgical History:  Procedure Laterality Date  . ADENOIDECTOMY    . DILATION AND EVACUATION N/A 07/23/2017   Procedure: DILATATION AND EVACUATION;  Surgeon: 09/22/2017, MD;  Location: [facility] ORS;  Service: Gynecology;  Laterality: N/A;  . TONSILLECTOMY      Current Outpatient Medications on File Prior to Visit  Medication Sig Dispense Refill  . ondansetron (ZOFRAN ODT) 4 MG disintegrating tablet Take 1 tablet (4 mg total) by mouth every 8 (eight) hours as needed for nausea or vomiting. 20 tablet 2  . Prenatal Vit-Fe Fumarate-FA (PRENATAL MULTIVITAMIN) TABS tablet Take 1 tablet by mouth daily at 12 noon.     No current facility-administered medications on file prior to visit.     No Known Allergies  Social History   Socioeconomic History  . Marital status: Married    Spouse name: Not on file  . Number  of children: 1  . Years of education: Not on file  . Highest education level: Not on file  Occupational History  . Not on file  Social Needs  . Financial resource strain: Not on file  . Food insecurity    Worry: Not on file    Inability: Not on file  . Transportation needs    Medical: Not on file    Non-medical: Not on file  Tobacco Use  . Smoking status: Never Smoker  . Smokeless tobacco: Never Used  Substance and Sexual Activity  . Alcohol use: No  . Drug use: No  . Sexual activity: Yes    Birth control/protection: None  Lifestyle  . Physical activity    Days per week: Not on file    Minutes per session: Not on file  . Stress: Not on file  Relationships  . Social Linzie Collin on phone: Not on file    Gets together: Not on file    Attends religious service: Not on file    Active member of club or organization: Not on file    Attends meetings of clubs or organizations: Not on file    Relationship status: Not on file  . Intimate partner violence  Fear of current or ex partner: Not on file    Emotionally abused: Not on file    Physically abused: Not on file    Forced sexual activity: Not on file  Other Topics Concern  . Not on file  Social History Narrative  . Not on file    Family History  Problem Relation Age of Onset  . Hypertension Mother   . Arrhythmia Mother        palpitations  . Hyperlipidemia Mother   . Heart attack Maternal Uncle 42  . Heart attack Maternal Grandmother   . Heart disease Maternal Grandmother        CABG  . Stroke Maternal Grandmother   . Osteoarthritis Maternal Grandmother   . Hypertension Maternal Grandfather   . Stroke Maternal Grandfather     The following portions of the patient's history were reviewed and updated as appropriate: allergies, current medications, past OB history, past medical history, past surgical history, past family history, past social history, and problem list.    OBJECTIVE: Initial Physical  Exam (New OB)  GENERAL APPEARANCE: alert, well appearing, in no apparent distress, overweight HEAD: normocephalic, atraumatic MOUTH: mucous membranes moist, pharynx normal without lesions and dental hygiene good THYROID: no thyromegaly or masses present BREASTS: no masses noted, no significant tenderness, no palpable axillary nodes, no skin changes LUNGS: clear to auscultation, no wheezes, rales or rhonchi, symmetric air entry HEART: regular rate and rhythm, no murmurs ABDOMEN: soft, nontender, nondistended, no abnormal masses, no epigastric pain and fundus not palpable EXTREMITIES: no redness or tenderness in the calves or thighs SKIN: normal coloration and turgor, no rashes LYMPH NODES: no adenopathy palpable NEUROLOGIC: alert, oriented, normal speech, no focal findings or movement disorder noted  PELVIC EXAM EXTERNAL GENITALIA: normal appearing vulva with no masses, tenderness or lesions VAGINA: no abnormal discharge or lesions CERVIX: no lesions or cervical motion tenderness UTERUS: gravid and consistent with 12 weeks  ASSESSMENT: Normal pregnancy Constipation Nausea and vomiting     PLAN: Prenatal care Genetic screening done Flu vaccine given To continue miralax daily RTC 1 month or sooner if needed. See orders

## 2019-09-13 NOTE — Addendum Note (Signed)
Addended by: Garner Nash on: 09/13/2019 03:33 PM   Modules accepted: Orders

## 2019-09-13 NOTE — Progress Notes (Signed)
Patient here for NOB physical, c/o intermittent pelvic cramping x2 weeks and thick white vaginal discharge x1 week.

## 2019-09-13 NOTE — Patient Instructions (Addendum)
Second Trimester of Pregnancy  The second trimester is from week 14 through week 27 (month 4 through 6). This is often the time in pregnancy that you feel your best. Often times, morning sickness has lessened or quit. You may have more energy, and you may get hungry more often. Your unborn baby is growing rapidly. At the end of the sixth month, he or she is about 9 inches long and weighs about 1 pounds. You will likely feel the baby move between 18 and 20 weeks of pregnancy. Follow these instructions at home: Medicines  Take over-the-counter and prescription medicines only as told by your doctor. Some medicines are safe and some medicines are not safe during pregnancy.  Take a prenatal vitamin that contains at least 600 micrograms (mcg) of folic acid.  If you have trouble pooping (constipation), take medicine that will make your stool soft (stool softener) if your doctor approves. Eating and drinking   Eat regular, healthy meals.  Avoid raw meat and uncooked cheese.  If you get low calcium from the food you eat, talk to your doctor about taking a daily calcium supplement.  Avoid foods that are high in fat and sugars, such as fried and sweet foods.  If you feel sick to your stomach (nauseous) or throw up (vomit): ? Eat 4 or 5 small meals a day instead of 3 large meals. ? Try eating a few soda crackers. ? Drink liquids between meals instead of during meals.  To prevent constipation: ? Eat foods that are high in fiber, like fresh fruits and vegetables, whole grains, and beans. ? Drink enough fluids to keep your pee (urine) clear or pale yellow. Activity  Exercise only as told by your doctor. Stop exercising if you start to have cramps.  Do not exercise if it is too hot, too humid, or if you are in a place of great height (high altitude).  Avoid heavy lifting.  Wear low-heeled shoes. Sit and stand up straight.  You can continue to have sex unless your doctor tells you not to.  Relieving pain and discomfort  Wear a good support bra if your breasts are tender.  Take warm water baths (sitz baths) to soothe pain or discomfort caused by hemorrhoids. Use hemorrhoid cream if your doctor approves.  Rest with your legs raised if you have leg cramps or low back pain.  If you develop puffy, bulging veins (varicose veins) in your legs: ? Wear support hose or compression stockings as told by your doctor. ? Raise (elevate) your feet for 15 minutes, 3-4 times a day. ? Limit salt in your food. Prenatal care  Write down your questions. Take them to your prenatal visits.  Keep all your prenatal visits as told by your doctor. This is important. Safety  Wear your seat belt when driving.  Make a list of emergency phone numbers, including numbers for family, friends, the hospital, and police and fire departments. General instructions  Ask your doctor about the right foods to eat or for help finding a counselor, if you need these services.  Ask your doctor about local prenatal classes. Begin classes before month 6 of your pregnancy.  Do not use hot tubs, steam rooms, or saunas.  Do not douche or use tampons or scented sanitary pads.  Do not cross your legs for long periods of time.  Visit your dentist if you have not done so. Use a soft toothbrush to brush your teeth. Floss gently.  Avoid all smoking, herbs,   and alcohol. Avoid drugs that are not approved by your doctor.  Do not use any products that contain nicotine or tobacco, such as cigarettes and e-cigarettes. If you need help quitting, ask your doctor.  Avoid cat litter boxes and soil used by cats. These carry germs that can cause birth defects in the baby and can cause a loss of your baby (miscarriage) or stillbirth. Contact a doctor if:  You have mild cramps or pressure in your lower belly.  You have pain when you pee (urinate).  You have bad smelling fluid coming from your vagina.  You continue to feel  sick to your stomach (nauseous), throw up (vomit), or have watery poop (diarrhea).  You have a nagging pain in your belly area.  You feel dizzy. Get help right away if:  You have a fever.  You are leaking fluid from your vagina.  You have spotting or bleeding from your vagina.  You have severe belly cramping or pain.  You lose or gain weight rapidly.  You have trouble catching your breath and have chest pain.  You notice sudden or extreme puffiness (swelling) of your face, hands, ankles, feet, or legs.  You have not felt the baby move in over an hour.  You have severe headaches that do not go away when you take medicine.  You have trouble seeing. Summary  The second trimester is from week 14 through week 27 (months 4 through 6). This is often the time in pregnancy that you feel your best.  To take care of yourself and your unborn baby, you will need to eat healthy meals, take medicines only if your doctor tells you to do so, and do activities that are safe for you and your baby.  Call your doctor if you get sick or if you notice anything unusual about your pregnancy. Also, call your doctor if you need help with the right food to eat, or if you want to know what activities are safe for you. This information is not intended to replace advice given to you by your health care provider. Make sure you discuss any questions you have with your health care provider. Document Released: 01/27/2010 Document Revised: 02/24/2019 Document Reviewed: 12/08/2016 Elsevier Patient Education  2020 [employer].  Constipation, Adult Constipation is when a person has fewer bowel movements in a week than normal, has difficulty having a bowel movement, or has stools that are dry, hard, or larger than normal. Constipation may be caused by an underlying condition. It may become worse with age if a person takes certain medicines and does not take in enough fluids. Follow these instructions at home:  Eating and drinking   Eat foods that have a lot of fiber, such as fresh fruits and vegetables, whole grains, and beans.  Limit foods that are high in fat, low in fiber, or overly processed, such as french fries, hamburgers, cookies, candies, and soda.  Drink enough fluid to keep your urine clear or pale yellow. General instructions  Exercise regularly or as told by your health care provider.  Go to the restroom when you have the urge to go. Do not hold it in.  Take over-the-counter and prescription medicines only as told by your health care provider. These include any fiber supplements.  Practice pelvic floor retraining exercises, such as deep breathing while relaxing the lower abdomen and pelvic floor relaxation during bowel movements.  Watch your condition for any changes.  Keep all follow-up visits as told by your  health care provider. This is important. Contact a health care provider if:  You have pain that gets worse.  You have a fever.  You do not have a bowel movement after 4 days.  You vomit.  You are not hungry.  You lose weight.  You are bleeding from the anus.  You have thin, pencil-like stools. Get help right away if:  You have a fever and your symptoms suddenly get worse.  You leak stool or have blood in your stool.  Your abdomen is bloated.  You have severe pain in your abdomen.  You feel dizzy or you faint. This information is not intended to replace advice given to you by your health care provider. Make sure you discuss any questions you have with your health care provider. Document Released: 07/31/2004 Document Revised: 10/15/2017 Document Reviewed: 04/22/2016 Elsevier Patient Education  2020 [employer].

## 2019-09-14 LAB — CBC WITH DIFFERENTIAL/PLATELET
Basophils Absolute: 0 10*3/uL (ref 0.0–0.2)
Basos: 0 %
EOS (ABSOLUTE): 0 10*3/uL (ref 0.0–0.4)
Eos: 1 %
Hematocrit: 38.3 % (ref 34.0–46.6)
Hemoglobin: 13.3 g/dL (ref 11.1–15.9)
Immature Grans (Abs): 0 10*3/uL (ref 0.0–0.1)
Immature Granulocytes: 0 %
Lymphocytes Absolute: 1.6 10*3/uL (ref 0.7–3.1)
Lymphs: 24 %
MCH: 30.6 pg (ref 26.6–33.0)
MCHC: 34.7 g/dL (ref 31.5–35.7)
MCV: 88 fL (ref 79–97)
Monocytes Absolute: 0.5 10*3/uL (ref 0.1–0.9)
Monocytes: 7 %
Neutrophils Absolute: 4.6 10*3/uL (ref 1.4–7.0)
Neutrophils: 68 %
Platelets: 183 10*3/uL (ref 150–450)
RBC: 4.35 x10E6/uL (ref 3.77–5.28)
RDW: 12.9 % (ref 11.7–15.4)
WBC: 6.7 10*3/uL (ref 3.4–10.8)

## 2019-09-18 LAB — CYTOLOGY - PAP: Diagnosis: NEGATIVE

## 2019-10-11 ENCOUNTER — Other Ambulatory Visit: Payer: Self-pay

## 2019-10-11 ENCOUNTER — Ambulatory Visit (INDEPENDENT_AMBULATORY_CARE_PROVIDER_SITE_OTHER): Payer: Medicaid Other | Admitting: Certified Nurse Midwife

## 2019-10-11 VITALS — BP 115/57 | HR 84 | Wt 142.1 lb

## 2019-10-11 DIAGNOSIS — Z3482 Encounter for supervision of other normal pregnancy, second trimester: Secondary | ICD-10-CM

## 2019-10-11 NOTE — Patient Instructions (Signed)

## 2019-10-11 NOTE — Progress Notes (Signed)
ROB doing well . Discussed round ligament pain and musculoskeletal discomforts. Self help measures reviwed. Pt complains of constipation . Referred to medication list . Encouraged stool softer 2 x daily, po hydration , suppository if needed. She has been taking Murelax. Follow up 4-5 wks for ROB and anatomy u/s.  Philip Aspen, CNM

## 2019-10-18 ENCOUNTER — Other Ambulatory Visit: Payer: Self-pay

## 2019-10-18 ENCOUNTER — Encounter: Payer: Self-pay | Admitting: Emergency Medicine

## 2019-10-18 ENCOUNTER — Emergency Department
Admission: EM | Admit: 2019-10-18 | Discharge: 2019-10-18 | Disposition: A | Discharge status: Home / Self Care | Payer: Medicaid Other | Attending: Emergency Medicine | Admitting: Emergency Medicine

## 2019-10-18 ENCOUNTER — Emergency Department: Payer: Medicaid Other

## 2019-10-18 ENCOUNTER — Telehealth: Payer: Self-pay | Admitting: Certified Nurse Midwife

## 2019-10-18 DIAGNOSIS — Z3A16 16 weeks gestation of pregnancy: Secondary | ICD-10-CM | POA: Insufficient documentation

## 2019-10-18 DIAGNOSIS — O2342 Unspecified infection of urinary tract in pregnancy, second trimester: Secondary | ICD-10-CM | POA: Diagnosis not present

## 2019-10-18 DIAGNOSIS — Y939 Activity, unspecified: Secondary | ICD-10-CM | POA: Diagnosis not present

## 2019-10-18 DIAGNOSIS — Y999 Unspecified external cause status: Secondary | ICD-10-CM | POA: Diagnosis not present

## 2019-10-18 DIAGNOSIS — W19XXXA Unspecified fall, initial encounter: Secondary | ICD-10-CM | POA: Diagnosis not present

## 2019-10-18 DIAGNOSIS — R1030 Lower abdominal pain, unspecified: Secondary | ICD-10-CM | POA: Diagnosis not present

## 2019-10-18 DIAGNOSIS — O9A212 Injury, poisoning and certain other consequences of external causes complicating pregnancy, second trimester: Secondary | ICD-10-CM | POA: Diagnosis present

## 2019-10-18 DIAGNOSIS — Z3492 Encounter for supervision of normal pregnancy, unspecified, second trimester: Secondary | ICD-10-CM

## 2019-10-18 DIAGNOSIS — Y929 Unspecified place or not applicable: Secondary | ICD-10-CM | POA: Diagnosis not present

## 2019-10-18 LAB — CBC WITH DIFFERENTIAL/PLATELET
Abs Immature Granulocytes: 0.06 10*3/uL (ref 0.00–0.07)
Basophils Absolute: 0 10*3/uL (ref 0.0–0.1)
Basophils Relative: 0 %
Eosinophils Absolute: 0.1 10*3/uL (ref 0.0–0.5)
Eosinophils Relative: 1 %
HCT: 36.1 % (ref 36.0–46.0)
Hemoglobin: 13.1 g/dL (ref 12.0–15.0)
Immature Granulocytes: 1 %
Lymphocytes Relative: 23 %
Lymphs Abs: 2.1 10*3/uL (ref 0.7–4.0)
MCH: 30.2 pg (ref 26.0–34.0)
MCHC: 36.3 g/dL — ABNORMAL HIGH (ref 30.0–36.0)
MCV: 83.2 fL (ref 80.0–100.0)
Monocytes Absolute: 0.6 10*3/uL (ref 0.1–1.0)
Monocytes Relative: 6 %
Neutro Abs: 6.4 10*3/uL (ref 1.7–7.7)
Neutrophils Relative %: 69 %
Platelets: 169 10*3/uL (ref 150–400)
RBC: 4.34 MIL/uL (ref 3.87–5.11)
RDW: 12.5 % (ref 11.5–15.5)
WBC: 9.3 10*3/uL (ref 4.0–10.5)
nRBC: 0 % (ref 0.0–0.2)

## 2019-10-18 LAB — COMPREHENSIVE METABOLIC PANEL
ALT: 15 U/L (ref 0–44)
AST: 15 U/L (ref 15–41)
Albumin: 3.8 g/dL (ref 3.5–5.0)
Alkaline Phosphatase: 38 U/L (ref 38–126)
Anion gap: 8 (ref 5–15)
BUN: 8 mg/dL (ref 6–20)
CO2: 21 mmol/L — ABNORMAL LOW (ref 22–32)
Calcium: 8.9 mg/dL (ref 8.9–10.3)
Chloride: 106 mmol/L (ref 98–111)
Creatinine, Ser: 0.46 mg/dL (ref 0.44–1.00)
GFR calc Af Amer: >60 mL/min (ref >60)
GFR calc non Af Amer: >60 mL/min (ref >60)
Glucose, Bld: 92 mg/dL (ref 70–99)
Potassium: 3.7 mmol/L (ref 3.5–5.1)
Sodium: 135 mmol/L (ref 135–145)
Total Bilirubin: 0.5 mg/dL (ref 0.3–1.2)
Total Protein: 6.7 g/dL (ref 6.5–8.1)

## 2019-10-18 LAB — URINALYSIS, COMPLETE (UACMP) WITH MICROSCOPIC
Bilirubin Urine: NEGATIVE
Glucose, UA: NEGATIVE mg/dL
Hgb urine dipstick: NEGATIVE
Ketones, ur: NEGATIVE mg/dL
Leukocytes,Ua: NEGATIVE
Nitrite: NEGATIVE
Protein, ur: 30 mg/dL — AB
Specific Gravity, Urine: 1.029 (ref 1.005–1.030)
pH: 5 (ref 5.0–8.0)

## 2019-10-18 LAB — LIPASE, BLOOD: Lipase: 23 U/L (ref 11–51)

## 2019-10-18 MED ORDER — ACETAMINOPHEN 500 MG PO TABS
1000.0000 mg | ORAL_TABLET | Freq: Once | ORAL | Status: AC
  Administered 2019-10-18: 1000 mg via ORAL
  Filled 2019-10-18: qty 2

## 2019-10-18 MED ORDER — NITROFURANTOIN MONOHYD MACRO 100 MG PO CAPS: 100.0000 mg | ORAL_CAPSULE | Freq: Two times a day (BID) | ORAL | 0 refills | Status: AC

## 2019-10-18 NOTE — Discharge Instructions (Addendum)
For your constipation: -Continue Colace (docusate sodium) twice a day -Start taking Metamucil/fiber supplement daily -Mix 6 cap fulls of Miralax In 32 oz of Gatorade/Powerade or other electrolyte/rehydration drink -Drink this over the course of 24 hours -Drink water as well, at least 6-8 glasses, whenever taking Miralax -If this does not produce a significant amount of stool that eventually becomes loose, you can take one dose of Milk of Magnesia -Once you start having loose stools, stop taking the high dose Miralax -Go back to colace twice a day and Miralax once a day, with up to 3 doses of Miralax/day to keep BM soft  For your pain: - Take tylenol as needed - Take the full course of antibiotic

## 2019-10-18 NOTE — Telephone Encounter (Addendum)
PT Called in she fell Saturday, pt said she was fine but started cramping today . We advised her to go to lab and del.  Please advise

## 2019-10-18 NOTE — ED Notes (Signed)
Pt presentation discussed with EDP, Monks; see new orders.

## 2019-10-18 NOTE — ED Notes (Signed)
POC was POSITIVE! 

## 2019-10-18 NOTE — ED Notes (Signed)
FHT [id number]

## 2019-10-18 NOTE — ED Triage Notes (Signed)
Pt in via POV, reports falling on Sunday, since with abdominal cramping.  Pt is [redacted] weeks pregnant, advised per OB to be evaluated here.  Denies any vaginal bleeding.  NAD noted at this time.

## 2019-10-18 NOTE — ED Notes (Signed)
Says fell on rock steps Sunday.  Says no loc, not sure if she hit her abd.  Says she has low abd craming and is [redacted] weeks pregnant.  No fetal movement felt yet this pregnance.  abd soft and non tender.  Last bm cant remember when--has been constipated and taking mirilax every day.  No urinary problems

## 2019-10-18 NOTE — ED Provider Notes (Signed)
[facility] Emergency Department Provider Note  ____________________________________________   First MD Initiated Contact with Patient 10/18/19 1723     (approximate)  I have reviewed the triage vital signs and the nursing notes.   HISTORY  Chief Complaint Fall and Abdominal Pain    HPI Alexandra Joseph is a 23 y.o. female  With h/o anxiety, menorrhagia, migraines, currently [redacted] wk pregnant here with abd pain after fall.  three days ago, pt was walking up stairs when she reportedly lost her footing, falling down 1-2 steps and landing on the ground. She had to reach up to catch herself. She does not remember if she struck her abd directly. Since then, she's had mild abdominal pain and cramping which is localized primarily to the lower abdomen/SP area. She has not noticed any vaginal bleeding or discharge. She has not noticed any hematuria. No flank pain. No nausea, vomiting. She has had issues with constipation but this has not gotten worse since the fall. No head injury. No neck pain. No other complaints. Called her OB today who told her to come to the ED.       Past Medical History:  Diagnosis Date   Anemia    Anxiety    Costochondritis    Depression    Menorrhagia    Migraine    Tachycardia     Patient Active Problem List   Diagnosis Date Noted   Normal spontaneous vaginal delivery 07/23/2017    Past Surgical History:  Procedure Laterality Date   ADENOIDECTOMY     DILATION AND EVACUATION N/A 07/23/2017   Procedure: DILATATION AND EVACUATION;  Surgeon: Linzie CollinEvans, David James, MD;  Location: [facility] ORS;  Service: Gynecology;  Laterality: N/A;   TONSILLECTOMY      Prior to Admission medications   Medication Sig Start Date End Date Taking? Authorizing Provider  ondansetron (ZOFRAN ODT) 4 MG disintegrating tablet Take 1 tablet (4 mg total) by mouth every 8 (eight) hours as needed for nausea or vomiting. 08/15/19  Yes Shambley, Melody N, CNM    Prenatal Vit-Fe Fumarate-FA (PRENATAL MULTIVITAMIN) TABS tablet Take 1 tablet by mouth daily at 12 noon.   Yes [provider]  nitrofurantoin, macrocrystal-monohydrate, (MACROBID) 100 MG capsule Take 1 capsule (100 mg total) by mouth 2 (two) times daily for 5 days. 10/18/19 10/23/19  Shaune PollackIsaacs, Florean Hoobler, MD    Allergies Patient has no known allergies.  Family History  Problem Relation Age of Onset   Hypertension Mother    Arrhythmia Mother        palpitations   Hyperlipidemia Mother    Heart attack Maternal Uncle [id number]   Heart attack Maternal Grandmother    Heart disease Maternal Grandmother        CABG   Stroke Maternal Grandmother    Osteoarthritis Maternal Grandmother    Hypertension Maternal Grandfather    Stroke Maternal Grandfather     Social History Social History   Tobacco Use   Smoking status: Never Smoker   Smokeless tobacco: Never Used  Substance Use Topics   Alcohol use: No   Drug use: No    Review of Systems  Review of Systems  Constitutional: Negative for fatigue and fever.  HENT: Negative for congestion and sore throat.   Eyes: Negative for visual disturbance.  Respiratory: Negative for cough and shortness of breath.   Cardiovascular: Negative for chest pain.  Gastrointestinal: Positive for abdominal pain. Negative for diarrhea, nausea and vomiting.  Genitourinary: Positive for pelvic pain.  Negative for flank pain.  Musculoskeletal: Negative for back pain and neck pain.  Skin: Negative for rash and wound.  Neurological: Negative for weakness.  All other systems reviewed and are negative.    ____________________________________________  PHYSICAL EXAM:      VITAL SIGNS: ED Triage Vitals [10/18/19 1629]  Enc Vitals Group     BP 129/64     Pulse Rate 93     Resp 16     Temp 99.7 F (37.6 C)     Temp Source Oral     SpO2 98 %     Weight 144 lb (65.3 kg)     Height 5' (1.524 m)     Head Circumference      Peak Flow      Pain  Score      Pain Loc      Pain Edu?      Excl. in [place]?      Physical Exam Vitals signs and nursing note reviewed.  Constitutional:      General: She is not in acute distress.    Appearance: She is well-developed.  HENT:     Head: Normocephalic and atraumatic.  Eyes:     Conjunctiva/sclera: Conjunctivae normal.  Neck:     Musculoskeletal: Neck supple.  Cardiovascular:     Rate and Rhythm: Normal rate and regular rhythm.     Heart sounds: Normal heart sounds. No murmur. No friction rub.  Pulmonary:     Effort: Pulmonary effort is normal. No respiratory distress.     Breath sounds: Normal breath sounds. No wheezing or rales.  Abdominal:     General: There is no distension.     Palpations: Abdomen is soft.     Tenderness: There is abdominal tenderness in the suprapubic area. There is no guarding or rebound.  Skin:    General: Skin is warm.     Capillary Refill: Capillary refill takes less than 2 seconds.  Neurological:     Mental Status: She is alert and oriented to person, place, and time.     Motor: No abnormal muscle tone.       ____________________________________________   LABS (all labs ordered are listed, but only abnormal results are displayed)  Labs Reviewed  URINALYSIS, COMPLETE (UACMP) WITH MICROSCOPIC - Abnormal; Notable for the following components:      Result Value   Color, Urine YELLOW (*)    APPearance TURBID (*)    Protein, ur 30 (*)    Bacteria, UA RARE (*)    All other components within normal limits  CBC WITH DIFFERENTIAL/PLATELET - Abnormal; Notable for the following components:   MCHC 36.3 (*)    All other components within normal limits  COMPREHENSIVE METABOLIC PANEL - Abnormal; Notable for the following components:   CO2 21 (*)    All other components within normal limits  LIPASE, BLOOD  POC URINE PREG, ED    ____________________________________________  EKG: None ________________________________________  RADIOLOGY All imaging,  including plain films, CT scans, and ultrasounds, independently reviewed by me, and interpretations confirmed via formal radiology reads.  ED MD interpretation:   U/S: Live IUP, no acute findings, no free fluid  Official radiology report(s): US Ob Limited > 14 Wks  Result Date: 10/18/2019 CLINICAL DATA:  23 year old pregnant female with fall and abdominal pain. EXAM: LIMITED OBSTETRIC ULTRASOUND FINDINGS: Number of Fetuses: 1 Heart Rate:  44 bpm Movement: Detected Presentation: Variable Placental Location: Posterior Previa: None Amniotic Fluid (Subjective):  Within normal limits.  BPD: 3.4 cm 16 w  3 d MATERNAL FINDINGS: Cervix:  Appears closed. Uterus/Adnexae: No abnormality visualized. IMPRESSION: Single live intrauterine pregnancy with an estimated gestational age of [redacted] weeks, 3 days based on biparietal diameter. No acute findings. This exam is performed on an emergent basis and does not comprehensively evaluate fetal size, dating, or anatomy; follow-up complete OB US should be considered if further fetal assessment is warranted. Electronically Signed   By: Elgie Collard M.D.   On: 10/18/2019 17:19    ____________________________________________  PROCEDURES   Procedure(s) performed (including Critical Care):  Procedures  ____________________________________________  INITIAL IMPRESSION / MDM / ASSESSMENT AND PLAN / ED COURSE  As part of my medical decision making, I reviewed the following data within the electronic MEDICAL RECORD NUMBER Nursing notes reviewed and incorporated, Old chart reviewed, Notes from prior ED visits, and [place] Controlled Substance Database       *CAYCE PASCHAL was evaluated in Emergency Department on 10/18/2019 for the symptoms described in the history of present illness. She was evaluated in the context of the global COVID-19 pandemic, which necessitated consideration that the patient might be at risk for infection with the SARS-CoV-2 virus that causes COVID-19.  Institutional protocols and algorithms that pertain to the evaluation of patients at risk for COVID-19 are in a state of rapid change based on information released by regulatory bodies including the CDC and federal and state organizations. These policies and algorithms were followed during the patient's care in the ED.  Some ED evaluations and interventions may be delayed as a result of limited staffing during the pandemic.*     Medical Decision Making:  23 yo F here with abd pain after fall 3 days ago. Currently [redacted] wk pregnant. Exam benign, with minimal TTP, no bruising. Labs reassuring with normal Hgb, no leukocytosis, normal LFTs and lipase, no evidence to suggest occult intra-abd injury. U/S shows viable IUP with no complications and no free fluids. She's had no vaginal bleeding. UA does show mild pyuria, rare bacteria. Will tx with ABX for this, give good bowel regimen for her chronic constipation, and have her f/u with her OBGYN.  ____________________________________________  FINAL CLINICAL IMPRESSION(S) / ED DIAGNOSES  Final diagnoses:  Lower abdominal pain  Second trimester pregnancy  Fall, initial encounter  Urinary tract infection in mother during second trimester of pregnancy     MEDICATIONS GIVEN DURING THIS VISIT:  Medications  acetaminophen (TYLENOL) tablet 1,000 mg (1,000 mg Oral Given 10/18/19 1754)     ED Discharge Orders         Ordered    nitrofurantoin, macrocrystal-monohydrate, (MACROBID) 100 MG capsule  2 times daily     10/18/19 1823           Note:  This document was prepared using Dragon voice recognition software and may include unintentional dictation errors.   Shaune Pollack, MD 10/18/19 [phone]

## 2019-10-20 LAB — URINE CULTURE: Special Requests: NORMAL

## 2019-11-14 ENCOUNTER — Ambulatory Visit (INDEPENDENT_AMBULATORY_CARE_PROVIDER_SITE_OTHER): Payer: BC Managed Care – PPO | Admitting: Certified Nurse Midwife

## 2019-11-14 ENCOUNTER — Ambulatory Visit (INDEPENDENT_AMBULATORY_CARE_PROVIDER_SITE_OTHER): Payer: BC Managed Care – PPO

## 2019-11-14 ENCOUNTER — Other Ambulatory Visit: Payer: Self-pay

## 2019-11-14 ENCOUNTER — Encounter: Payer: Self-pay | Admitting: Certified Nurse Midwife

## 2019-11-14 VITALS — BP 113/61 | HR 84 | Wt 148.2 lb

## 2019-11-14 DIAGNOSIS — Z3482 Encounter for supervision of other normal pregnancy, second trimester: Secondary | ICD-10-CM

## 2019-11-14 DIAGNOSIS — Z363 Encounter for antenatal screening for malformations: Secondary | ICD-10-CM | POA: Diagnosis not present

## 2019-11-14 DIAGNOSIS — Z3A20 20 weeks gestation of pregnancy: Secondary | ICD-10-CM

## 2019-11-14 LAB — POCT URINALYSIS DIPSTICK OB
Bilirubin, UA: NEGATIVE
Blood, UA: NEGATIVE
Glucose, UA: NEGATIVE
Ketones, UA: NEGATIVE
Leukocytes, UA: NEGATIVE
Nitrite, UA: NEGATIVE
POC,PROTEIN,UA: NEGATIVE
Spec Grav, UA: 1.015 (ref 1.010–1.025)
Urobilinogen, UA: 0.2 E.U./dL
pH, UA: 5 (ref 5.0–8.0)

## 2019-11-14 NOTE — Patient Instructions (Signed)

## 2019-11-14 NOTE — Progress Notes (Signed)
   Patient Name: Alexandra Joseph DOB: 1996/07/19 MRN: [id number] [place] doing well. Discussed ready set baby. Encouraged breastfeeding and completion of online course. She verbalizes and agrees to plan. Discussed concern for her job. 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 Discussed reaching out to HR to see if she got a note if she could be excused from those areas. TOC for UTI today. Has some back pain, self help measures reviewed. Will follow up with results. ROB 4 wks.  Philip Aspen, CNM  Location: [facility] Date of Service: 11/14/2019   Indications:Anatomy Ultrasound Findings:  Alexandra Joseph intrauterine pregnancy is visualized with FHR at 145 BPM. Biometrics give an (U/S) Gestational age of [redacted]w[redacted]d and an (U/S) EDD of 04/02/2020; this correlates with the clinically established Estimated Date of Delivery: 04/01/20  Fetal presentation is Cephalic.  EFW: 320 g ( 11 oz). Fetal Percentile  Placenta: posterior. Grade: 1 AFI: subjectively normal.  Anatomic survey is complete and normal; Gender - female.    Right Ovary is normal in appearance. Left Ovary is normal appearance. Survey of the adnexa demonstrates no adnexal masses. There is no free peritoneal fluid in the cul de sac.  Impression: 1. [redacted]w[redacted]d Viable Singleton Intrauterine pregnancy by U/S. 2. (U/S) EDD is consistent with Clinically established Estimated Date of Delivery: 04/01/20 . 3. Normal Anatomy Scan  Recommendations: 1.Clinical correlation with the patient's History and Physical Exam.   Alexandra Joseph    RDMS

## 2019-11-16 LAB — URINE CULTURE: Organism ID, Bacteria: NO GROWTH

## 2019-11-17 NOTE — L&D Delivery Note (Addendum)
       Delivery Note   Alexandra Joseph is a 24 y.o. G2P1001 at [redacted]w[redacted]d Estimated Date of Delivery: 04/01/20  PRE-OPERATIVE DIAGNOSIS:  1) [redacted]w[redacted]d pregnancy.    POST-OPERATIVE DIAGNOSIS:  1) [redacted]w[redacted]d pregnancy s/p Vaginal, Vacuum (Extractor)   Delivery Type: Vaginal, Vacuum (Extractor)    Delivery Anesthesia: Epidural   Labor Complications:  cord x 1 around leg    ESTIMATED BLOOD LOSS: 350 ml    FINDINGS:   1) female infant, Apgar scores of 7   at 1 minute and 9   at 5 minutes and a birthweight pending, infant remains skin to skin.     2) Nuchal cord: around leg x 1 loose  SPECIMENS:   PLACENTA:   Appearance: Intact , 3 vessel cord   Removal: Spontaneous      Disposition:  per protocol   DISPOSITION:  Infant to left in stable condition in the delivery room, with L&D personnel and mother,  NARRATIVE SUMMARY: Labor course:  Alexandra Joseph is a G2P1001 at [redacted]w[redacted]d who presented for induction of labor.  She progressed well in labor with pitocin and cytotec.  She received the appropriate anesthesia and proceeded to complete dilation. When she progressed to complete she complained of chest pain. Anesthesia consulted, stat EKG ordered, Dr. Logan Bores notified. She evidenced poor maternal expulsive effort during the second stage.  She went on to deliver a viable female infant "Raelynn" with vacuum assistance at plus 2 station in ROT position. Dr. Logan Bores present (see note).   The placenta delivered without problems and was noted to be complete. A perineal and vaginal examination was performed. Episiotomy/Lacerations: 1st degree;Vaginal . Laceration was repaired with 3-0 Vicryl Rapide suture. Pt has history of postpartum hemorrhage. 800 mcg cytotec placed in the rectum. The patient tolerated this well.  [name], CNM . 03/30/2020 5:08 PM

## 2019-12-13 ENCOUNTER — Encounter: Payer: Self-pay | Admitting: Certified Nurse Midwife

## 2019-12-13 ENCOUNTER — Ambulatory Visit (INDEPENDENT_AMBULATORY_CARE_PROVIDER_SITE_OTHER): Payer: BC Managed Care – PPO | Admitting: Certified Nurse Midwife

## 2019-12-13 ENCOUNTER — Other Ambulatory Visit: Payer: Self-pay

## 2019-12-13 VITALS — BP 104/60 | HR 85 | Wt 155.1 lb

## 2019-12-13 DIAGNOSIS — Z3402 Encounter for supervision of normal first pregnancy, second trimester: Secondary | ICD-10-CM

## 2019-12-13 LAB — POCT URINALYSIS DIPSTICK OB
Bilirubin, UA: NEGATIVE
Blood, UA: NEGATIVE
Glucose, UA: NEGATIVE
Ketones, UA: NEGATIVE
Leukocytes, UA: NEGATIVE
Nitrite, UA: NEGATIVE
POC,PROTEIN,UA: NEGATIVE
Spec Grav, UA: 1.01 (ref 1.010–1.025)
Urobilinogen, UA: 0.2 E.U./dL
pH, UA: 5 (ref 5.0–8.0)

## 2019-12-13 NOTE — Addendum Note (Signed)
Addended by: Brooke Dare on: 12/13/2019 11:21 AM   Modules accepted: Orders

## 2019-12-13 NOTE — Progress Notes (Signed)
ROB doing well. Feels good movement. Has musculoskeletal discomforts, discussed self help measures. Anticipatory guidance for 28 wk labs . She is undecided on babies name at this time. Follow up 4 wks with Marcelino Duster.   [name], CNM

## 2019-12-13 NOTE — Patient Instructions (Signed)

## 2019-12-20 ENCOUNTER — Telehealth: Payer: Self-pay | Admitting: Certified Nurse Midwife

## 2019-12-20 NOTE — Telephone Encounter (Signed)
Patient returned call.  Patient c/o sore throat, cough, body aches, green phlegm and low grade fever 99.8 on 1/29.  Patient has not check temp since then.  Symptoms started on 1/29.  +FM.  Negative COVID result today through [facility].  Advised patient to rest, hydrate and safe meds list will be sent to her via MyChart.  Patient aware that if symptoms persist or get worse to call us back or if it is outside of normal business hours to go to ED and patient verbalized understanding.

## 2019-12-20 NOTE — Telephone Encounter (Signed)
Called patient, no answer.  VM full, unable to [facility].

## 2019-12-20 NOTE — Telephone Encounter (Signed)
Pt called in the pt was tested for covid but the pt got a negative test. The pt is questing what meds she can take for it. Pt is requesting a call back. Please advise

## 2020-01-11 ENCOUNTER — Ambulatory Visit (INDEPENDENT_AMBULATORY_CARE_PROVIDER_SITE_OTHER): Payer: BC Managed Care – PPO | Admitting: Certified Nurse Midwife

## 2020-01-11 ENCOUNTER — Other Ambulatory Visit: Payer: Self-pay

## 2020-01-11 ENCOUNTER — Other Ambulatory Visit (HOSPITAL_COMMUNITY)
Admission: RE | Admit: 2020-01-11 | Discharge: 2020-01-11 | Disposition: A | Discharge status: Home / Self Care | Payer: Medicaid Other | Source: Ambulatory Visit | Attending: Certified Nurse Midwife | Admitting: Certified Nurse Midwife

## 2020-01-11 ENCOUNTER — Other Ambulatory Visit: Payer: BC Managed Care – PPO

## 2020-01-11 VITALS — BP 101/62 | HR 111 | Wt 162.1 lb

## 2020-01-11 DIAGNOSIS — Z3493 Encounter for supervision of normal pregnancy, unspecified, third trimester: Secondary | ICD-10-CM

## 2020-01-11 DIAGNOSIS — R109 Unspecified abdominal pain: Secondary | ICD-10-CM | POA: Diagnosis present

## 2020-01-11 DIAGNOSIS — O26899 Other specified pregnancy related conditions, unspecified trimester: Secondary | ICD-10-CM

## 2020-01-11 DIAGNOSIS — Z23 Encounter for immunization: Secondary | ICD-10-CM | POA: Diagnosis not present

## 2020-01-11 DIAGNOSIS — Z113 Encounter for screening for infections with a predominantly sexual mode of transmission: Secondary | ICD-10-CM

## 2020-01-11 DIAGNOSIS — Z3A28 28 weeks gestation of pregnancy: Secondary | ICD-10-CM | POA: Diagnosis not present

## 2020-01-11 DIAGNOSIS — Z131 Encounter for screening for diabetes mellitus: Secondary | ICD-10-CM

## 2020-01-11 DIAGNOSIS — Z8759 Personal history of other complications of pregnancy, childbirth and the puerperium: Secondary | ICD-10-CM

## 2020-01-11 DIAGNOSIS — Z130 Encounter for screening for diseases of the blood and blood-forming organs and certain disorders involving the immune mechanism: Secondary | ICD-10-CM

## 2020-01-11 LAB — POCT URINALYSIS DIPSTICK OB
Bilirubin, UA: NEGATIVE
Blood, UA: NEGATIVE
Glucose, UA: NEGATIVE
Ketones, UA: NEGATIVE
Leukocytes, UA: NEGATIVE
Nitrite, UA: NEGATIVE
POC,PROTEIN,UA: NEGATIVE
Spec Grav, UA: 1.01 (ref 1.010–1.025)
Urobilinogen, UA: 0.2 E.U./dL
pH, UA: 7 (ref 5.0–8.0)

## 2020-01-11 MED ORDER — TETANUS-DIPHTH-ACELL PERTUSSIS 5-2.5-18.5 LF-MCG/0.5 IM SUSP
0.5000 mL | Freq: Once | INTRAMUSCULAR | Status: AC
  Administered 2020-01-11: 10:00:00 0.5 mL via INTRAMUSCULAR

## 2020-01-11 NOTE — Lactation Note (Signed)
Lactation student discussed benefits of breastfeeding per the Ready, Set, Baby curriculum. Lind Guest encouraged to review breastfeeding information on Ready, set, Baby web site and given information for virtual breastfeeding classes.   Lactation Consultation Note  Patient Name: Alexandra Joseph Date: 01/11/2020     Maternal Data    Feeding    LATCH Score                   Interventions    Lactation Tools Discussed/Used     Consult Status      Carlena Hurl 01/11/2020, 3:15 PM

## 2020-01-11 NOTE — Patient Instructions (Addendum)
[facility]  [place], [place], [place] [address]  Phone: [phone]   [facility] (second location)  [address] [place], [place] [address]  Phone: [phone]   [facility] [place], [place], [place] [address] Phone: [phone]   [facility] [place], [place] [address]  Phone: [phone] Relief During Labor and Delivery Many things can cause pain during labor and delivery, including:  Pressure on bones and ligaments due to the baby moving through the pelvis.  Stretching of tissues due to the baby moving through the birth canal.  Muscle tension due to anxiety or nervousness.  The uterus tightening (contracting) and relaxing to help move the baby. There are many ways to deal with the pain of labor and delivery. They include:  Taking prenatal classes. Taking these classes helps you know what to expect during your baby's birth. What you learn will increase your confidence and decrease your anxiety.  Practicing relaxation techniques or doing relaxing activities, such as: ? Focused breathing. ? Meditation. ? Visualization. ? Aroma therapy. ? Listening to your favorite music. ? Hypnosis.  Taking a warm shower or bath (hydrotherapy). This may: ? Provide comfort and relaxation. ? Lessen your perception of pain. ? Decrease the amount of pain medicine needed. ? Decrease the length of labor.  Getting a massage or counterpressure on your back.  Applying warm packs or ice packs.  Changing positions often, moving around, or using a birthing ball.  Getting: ? Pain medicine through an IV or injection into a muscle. ? Pain medicine inserted into your spinal column. ? Injections of sterile water just under the skin on your lower back (intradermal injections). ? Laughing gas (nitrous oxide). Discuss your pain control options with your health care  provider during your prenatal visits. Explore the options offered by your hospital or birth center. What kinds of medicine are available? There are two kinds of medicines that can be used to relieve pain during labor and delivery:  Analgesics. These medicines decrease pain without causing you to lose feeling or the ability to move your muscles.  Anesthetics. These medicines block feeling in the body and can decrease your ability to move freely. Both of these kinds of medicine can cause minor side effects, such as nausea, trouble concentrating, and sleepiness. They can also decrease the baby's heart rate before birth and affect the baby's breathing rate after birth. For this reason, health care providers are careful about when and how much medicine is given. What are specific medicines and procedures that provide pain relief? Local Anesthetics Local anesthetics are used to numb a small area of the body. They may be used along with another kind of anesthetic or used to numb the nerves of the vagina, cervix, and perineum during the second stage of labor. General Anesthetics General anesthetics cause you to lose consciousness so you do not feel pain. They are usually only used for an emergency cesarean delivery. General anesthetics are given through an IV tube and a mask. Pudendal Block A pudendal block is a form of local anesthetic. It may be used to relieve the pain associated with pushing or stretching of the perineum at the time of delivery or to further numb the perineum. A pudendal block is done by injecting numbing medicine through the vaginal wall into a nerve in the pelvis. Epidural Analgesia Epidural analgesia is given through a flexible IV catheter that is inserted  into the lower back. Numbing medicine is delivered continuously to the area near your spinal column nerves (epidural space). After having this type of analgesia, you may be able to move your legs but you most likely will not be able  to walk. Depending on the amount of medicine given, you may lose all feeling in the lower half of your body, or you may retain some level of sensation, including the urge to push. Epidural analgesia can be used to provide pain relief for a vaginal birth. Spinal Block A spinal block is similar to epidural analgesia, but the medicine is injected into the spinal fluid instead of the epidural space. A spinal block is only given once. It starts to relieve pain quickly, but the pain relief lasts only 1-6 hours. Spinal blocks can be used for cesarean deliveries. Combined Spinal-Epidural (CSE) Block A CSE block combines the effects of a spinal block and epidural analgesia. The spinal block works quickly to block all pain. The epidural analgesia provides continuous pain relief, even after the effects of the spinal block have worn off. This information is not intended to replace advice given to you by your health care provider. Make sure you discuss any questions you have with your health care provider. Document Revised: 10/15/2017 Document Reviewed: 03/25/2016 Elsevier Patient Education  2020 [employer]. Breastfeeding  Choosing to breastfeed is one of the best decisions you can make for yourself and your baby. A change in hormones during pregnancy causes your breasts to make breast milk in your milk-producing glands. Hormones prevent breast milk from being released before your baby is born. They also prompt milk flow after birth. Once breastfeeding has begun, thoughts of your baby, as well as his or her sucking or crying, can stimulate the release of milk from your milk-producing glands. Benefits of breastfeeding Research shows that breastfeeding offers many health benefits for infants and mothers. It also offers a cost-free and convenient way to feed your baby. For your baby  Your first milk (colostrum) helps your baby's digestive system to function better.  Special cells in your milk (antibodies) help  your baby to fight off infections.  Breastfed babies are less likely to develop asthma, allergies, obesity, or type 2 diabetes. They are also at lower risk for sudden infant death syndrome (SIDS).  Nutrients in breast milk are better able to meet your baby's needs compared to infant formula.  Breast milk improves your baby's brain development. For you  Breastfeeding helps to create a very special bond between you and your baby.  Breastfeeding is convenient. Breast milk costs nothing and is always available at the correct temperature.  Breastfeeding helps to burn calories. It helps you to lose the weight that you gained during pregnancy.  Breastfeeding makes your uterus return faster to its size before pregnancy. It also slows bleeding (lochia) after you give birth.  Breastfeeding helps to lower your risk of developing type 2 diabetes, osteoporosis, rheumatoid arthritis, cardiovascular disease, and breast, ovarian, uterine, and endometrial cancer later in life. Breastfeeding basics Starting breastfeeding  Find a comfortable place to sit or lie down, with your neck and back well-supported.  Place a pillow or a rolled-up blanket under your baby to bring him or her to the level of your breast (if you are seated). Nursing pillows are specially designed to help support your arms and your baby while you breastfeed.  Make sure that your baby's tummy (abdomen) is facing your abdomen.  Gently massage your breast. With your fingertips,  massage from the outer edges of your breast inward toward the nipple. This encourages milk flow. If your milk flows slowly, you may need to continue this action during the feeding.  Support your breast with 4 fingers underneath and your thumb above your nipple (make the letter "C" with your hand). Make sure your fingers are well away from your nipple and your baby's mouth.  Stroke your baby's lips gently with your finger or nipple.  When your baby's mouth is open  wide enough, quickly bring your baby to your breast, placing your entire nipple and as much of the areola as possible into your baby's mouth. The areola is the colored area around your nipple. ? More areola should be visible above your baby's upper lip than below the lower lip. ? Your baby's lips should be opened and extended outward (flanged) to ensure an adequate, comfortable latch. ? Your baby's tongue should be between his or her lower gum and your breast.  Make sure that your baby's mouth is correctly positioned around your nipple (latched). Your baby's lips should create a seal on your breast and be turned out (everted).  It is common for your baby to suck about 2-3 minutes in order to start the flow of breast milk. Latching Teaching your baby how to latch onto your breast properly is very important. An improper latch can cause nipple pain, decreased milk supply, and poor weight gain in your baby. Also, if your baby is not latched onto your nipple properly, he or she may swallow some air during feeding. This can make your baby fussy. Burping your baby when you switch breasts during the feeding can help to get rid of the air. However, teaching your baby to latch on properly is still the best way to prevent fussiness from swallowing air while breastfeeding. Signs that your baby has successfully latched onto your nipple  Silent tugging or silent sucking, without causing you pain. Infant's lips should be extended outward (flanged).  Swallowing heard between every 3-4 sucks once your milk has started to flow (after your let-down milk reflex occurs).  Muscle movement above and in front of his or her ears while sucking. Signs that your baby has not successfully latched onto your nipple  Sucking sounds or smacking sounds from your baby while breastfeeding.  Nipple pain. If you think your baby has not latched on correctly, slip your finger into the corner of your baby's mouth to break the suction  and place it between your baby's gums. Attempt to start breastfeeding again. Signs of successful breastfeeding Signs from your baby  Your baby will gradually decrease the number of sucks or will completely stop sucking.  Your baby will fall asleep.  Your baby's body will relax.  Your baby will retain a small amount of milk in his or her mouth.  Your baby will let go of your breast by himself or herself. Signs from you  Breasts that have increased in firmness, weight, and size 1-3 hours after feeding.  Breasts that are softer immediately after breastfeeding.  Increased milk volume, as well as a change in milk consistency and color by the fifth day of breastfeeding.  Nipples that are not sore, cracked, or bleeding. Signs that your baby is getting enough milk  Wetting at least 1-2 diapers during the first 24 hours after birth.  Wetting at least 5-6 diapers every 24 hours for the first week after birth. The urine should be clear or pale yellow by the age  of 5 days.  Wetting 6-8 diapers every 24 hours as your baby continues to grow and develop.  At least 3 stools in a 24-hour period by the age of 5 days. The stool should be soft and yellow.  At least 3 stools in a 24-hour period by the age of 7 days. The stool should be seedy and yellow.  No loss of weight greater than 10% of birth weight during the first 3 days of life.  Average weight gain of 4-7 oz (113-198 g) per week after the age of 4 days.  Consistent daily weight gain by the age of 5 days, without weight loss after the age of 2 weeks. After a feeding, your baby may spit up a small amount of milk. This is normal. Breastfeeding frequency and duration Frequent feeding will help you make more milk and can prevent sore nipples and extremely full breasts (breast engorgement). Breastfeed when you feel the need to reduce the fullness of your breasts or when your baby shows signs of hunger. This is called "breastfeeding on demand."  Signs that your baby is hungry include:  Increased alertness, activity, or restlessness.  Movement of the head from side to side.  Opening of the mouth when the corner of the mouth or cheek is stroked (rooting).  Increased sucking sounds, smacking lips, cooing, sighing, or squeaking.  Hand-to-mouth movements and sucking on fingers or hands.  Fussing or crying. Avoid introducing a pacifier to your baby in the first 4-6 weeks after your baby is born. After this time, you may choose to use a pacifier. Research has shown that pacifier use during the first year of a baby's life decreases the risk of sudden infant death syndrome (SIDS). Allow your baby to feed on each breast as long as he or she wants. When your baby unlatches or falls asleep while feeding from the first breast, offer the second breast. Because newborns are often sleepy in the first few weeks of life, you may need to awaken your baby to get him or her to feed. Breastfeeding times will vary from baby to baby. However, the following rules can serve as a guide to help you make sure that your baby is properly fed:  Newborns (babies 3 weeks of age or younger) may breastfeed every 1-3 hours.  Newborns should not go without breastfeeding for longer than 3 hours during the day or 5 hours during the night.  You should breastfeed your baby a minimum of 8 times in a 24-hour period. Breast milk pumping     Pumping and storing breast milk allows you to make sure that your baby is exclusively fed your breast milk, even at times when you are unable to breastfeed. This is especially important if you go back to work while you are still breastfeeding, or if you are not able to be present during feedings. Your lactation consultant can help you find a method of pumping that works best for you and give you guidelines about how long it is safe to store breast milk. Caring for your breasts while you breastfeed Nipples can become dry, cracked, and  sore while breastfeeding. The following recommendations can help keep your breasts moisturized and healthy:  Avoid using soap on your nipples.  Wear a supportive bra designed especially for nursing. Avoid wearing underwire-style bras or extremely tight bras (sports bras).  Air-dry your nipples for 3-4 minutes after each feeding.  Use only cotton bra pads to absorb leaked breast milk. Leaking of breast  milk between feedings is normal.  Use lanolin on your nipples after breastfeeding. Lanolin helps to maintain your skin's normal moisture barrier. Pure lanolin is not harmful (not toxic) to your baby. You may also hand express a few drops of breast milk and gently massage that milk into your nipples and allow the milk to air-dry. In the first few weeks after giving birth, some women experience breast engorgement. Engorgement can make your breasts feel heavy, warm, and tender to the touch. Engorgement peaks within 3-5 days after you give birth. The following recommendations can help to ease engorgement:  Completely empty your breasts while breastfeeding or pumping. You may want to start by applying warm, moist heat (in the shower or with warm, water-soaked hand towels) just before feeding or pumping. This increases circulation and helps the milk flow. If your baby does not completely empty your breasts while breastfeeding, pump any extra milk after he or she is finished.  Apply ice packs to your breasts immediately after breastfeeding or pumping, unless this is too uncomfortable for you. To do this: ? Put ice in a plastic bag. ? Place a towel between your skin and the bag. ? Leave the ice on for 20 minutes, 2-3 times a day.  Make sure that your baby is latched on and positioned properly while breastfeeding. If engorgement persists after 48 hours of following these recommendations, contact your health care provider or a Advertising copywriter. Overall health care recommendations while  breastfeeding  Eat 3 healthy meals and 3 snacks every day. Well-nourished mothers who are breastfeeding need an additional 450-500 calories a day. You can meet this requirement by increasing the amount of a balanced diet that you eat.  Drink enough water to keep your urine pale yellow or clear.  Rest often, relax, and continue to take your prenatal vitamins to prevent fatigue, stress, and low vitamin and mineral levels in your body (nutrient deficiencies).  Do not use any products that contain nicotine or tobacco, such as cigarettes and e-cigarettes. Your baby may be harmed by chemicals from cigarettes that pass into breast milk and exposure to secondhand smoke. If you need help quitting, ask your health care provider.  Avoid alcohol.  Do not use illegal drugs or marijuana.  Talk with your health care provider before taking any medicines. These include over-the-counter and prescription medicines as well as vitamins and herbal supplements. Some medicines that may be harmful to your baby can pass through breast milk.  It is possible to become pregnant while breastfeeding. If birth control is desired, ask your health care provider about options that will be safe while breastfeeding your baby. Where to find more information: [employer]: www.llli.org Contact a health care provider if:  You feel like you want to stop breastfeeding or have become frustrated with breastfeeding.  Your nipples are cracked or bleeding.  Your breasts are red, tender, or warm.  You have: ? Painful breasts or nipples. ? A swollen area on either breast. ? A fever or chills. ? Nausea or vomiting. ? Drainage other than breast milk from your nipples.  Your breasts do not become full before feedings by the fifth day after you give birth.  You feel sad and depressed.  Your baby is: ? Too sleepy to eat well. ? Having trouble sleeping. ? More than 30 week old and wetting fewer than 6 diapers in a  24-hour period. ? Not gaining weight by 47 days of age.  Your baby has fewer than  3 stools in a 24-hour period.  Your baby's skin or the white parts of his or her eyes become yellow. Get help right away if:  Your baby is overly tired (lethargic) and does not want to wake up and feed.  Your baby develops an unexplained fever. Summary  Breastfeeding offers many health benefits for infant and mothers.  Try to breastfeed your infant when he or she shows early signs of hunger.  Gently tickle or stroke your baby's lips with your finger or nipple to allow the baby to open his or her mouth. Bring the baby to your breast. Make sure that much of the areola is in your baby's mouth. Offer one side and burp the baby before you offer the other side.  Talk with your health care provider or lactation consultant if you have questions or you face problems as you breastfeed. This information is not intended to replace advice given to you by your health care provider. Make sure you discuss any questions you have with your health care provider. Document Revised: 01/27/2018 Document Reviewed: 12/04/2016 Elsevier Patient Education  2020 [employer]. Vaginal Delivery  Vaginal delivery means that you give birth by pushing your baby out of your birth canal (vagina). A team of health care providers will help you before, during, and after vaginal delivery. Birth experiences are unique for every woman and every pregnancy, and birth experiences vary depending on where you choose to give birth. What happens when I arrive at the birth center or hospital? Once you are in labor and have been admitted into the hospital or birth center, your health care provider may:  Review your pregnancy history and any concerns that you have.  Insert an IV into one of your veins. This may be used to give you fluids and medicines.  Check your blood pressure, pulse, temperature, and heart rate (vital signs).  Check whether your bag  of water (amniotic sac) has broken (ruptured).  Talk with you about your birth plan and discuss pain control options. Monitoring Your health care provider may monitor your contractions (uterine monitoring) and your baby's heart rate (fetal monitoring). You may need to be monitored:  Often, but not continuously (intermittently).  All the time or for long periods at a time (continuously). Continuous monitoring may be needed if: ? You are taking certain medicines, such as medicine to relieve pain or make your contractions stronger. ? You have pregnancy or labor complications. Monitoring may be done by:  Placing a special stethoscope or a handheld monitoring device on your abdomen to check your baby's heartbeat and to check for contractions.  Placing monitors on your abdomen (external monitors) to record your baby's heartbeat and the frequency and length of contractions.  Placing monitors inside your uterus through your vagina (internal monitors) to record your baby's heartbeat and the frequency, length, and strength of your contractions. Depending on the type of monitor, it may remain in your uterus or on your baby's head until birth.  Telemetry. This is a type of continuous monitoring that can be done with external or internal monitors. Instead of having to stay in bed, you are able to move around during telemetry. Physical exam Your health care provider may perform frequent physical exams. This may include:  Checking how and where your baby is positioned in your uterus.  Checking your cervix to determine: ? Whether it is thinning out (effacing). ? Whether it is opening up (dilating). What happens during labor and delivery?  Normal labor  and delivery is divided into the following three stages: Stage 1  This is the longest stage of labor.  This stage can last for hours or days.  Throughout this stage, you will feel contractions. Contractions generally feel mild, infrequent, and  irregular at first. They get stronger, more frequent (about every 2-3 minutes), and more regular as you move through this stage.  This stage ends when your cervix is completely dilated to 4 inches (10 cm) and completely effaced. Stage 2  This stage starts once your cervix is completely effaced and dilated and lasts until the delivery of your baby.  This stage may last from 20 minutes to 2 hours.  This is the stage where you will feel an urge to push your baby out of your vagina.  You may feel stretching and burning pain, especially when the widest part of your baby's head passes through the vaginal opening (crowning).  Once your baby is delivered, the umbilical cord will be clamped and cut. This usually occurs after waiting a period of 1-2 minutes after delivery.  Your baby will be placed on your bare chest (skin-to-skin contact) in an upright position and covered with a warm blanket. Watch your baby for feeding cues, like rooting or sucking, and help the baby to your breast for his or her first feeding. Stage 3  This stage starts immediately after the birth of your baby and ends after you deliver the placenta.  This stage may take anywhere from 5 to 30 minutes.  After your baby has been delivered, you will feel contractions as your body expels the placenta and your uterus contracts to control bleeding. What can I expect after labor and delivery?  After labor is over, you and your baby will be monitored closely until you are ready to go home to ensure that you are both healthy. Your health care team will teach you how to care for yourself and your baby.  You and your baby will stay in the same room (rooming in) during your hospital stay. This will encourage early bonding and successful breastfeeding.  You may continue to receive fluids and medicines through an IV.  Your uterus will be checked and massaged regularly (fundal massage).  You will have some soreness and pain in your  abdomen, vagina, and the area of skin between your vaginal opening and your anus (perineum).  If an incision was made near your vagina (episiotomy) or if you had some vaginal tearing during delivery, cold compresses may be placed on your episiotomy or your tear. This helps to reduce pain and swelling.  You may be given a squirt bottle to use instead of wiping when you go to the bathroom. To use the squirt bottle, follow these steps: ? Before you urinate, fill the squirt bottle with warm water. Do not use hot water. ? After you urinate, while you are sitting on the toilet, use the squirt bottle to rinse the area around your urethra and vaginal opening. This rinses away any urine and blood. ? Fill the squirt bottle with clean water every time you use the bathroom.  It is normal to have vaginal bleeding after delivery. Wear a sanitary pad for vaginal bleeding and discharge. Summary  Vaginal delivery means that you will give birth by pushing your baby out of your birth canal (vagina).  Your health care provider may monitor your contractions (uterine monitoring) and your baby's heart rate (fetal monitoring).  Your health care provider may perform a physical exam.  Normal labor and delivery is divided into three stages.  After labor is over, you and your baby will be monitored closely until you are ready to go home. This information is not intended to replace advice given to you by your health care provider. Make sure you discuss any questions you have with your health care provider. Document Revised: 12/07/2017 Document Reviewed: 12/07/2017 Elsevier Patient Education  2020 [employer] Back Pain in Pregnancy Back pain during pregnancy is common. Back pain may be caused by several factors that are related to changes during your pregnancy. Follow these instructions at home: Managing pain, stiffness, and swelling      If directed, for sudden (acute) back pain, put ice on the painful  area. ? Put ice in a plastic bag. ? Place a towel between your skin and the bag. ? Leave the ice on for 20 minutes, 2-3 times per day.  If directed, apply heat to the affected area before you exercise. Use the heat source that your health care provider recommends, such as a moist heat pack or a heating pad. ? Place a towel between your skin and the heat source. ? Leave the heat on for 20-30 minutes. ? Remove the heat if your skin turns bright red. This is especially important if you are unable to feel pain, heat, or cold. You may have a greater risk of getting burned.  If directed, massage the affected area. Activity  Exercise as told by your health care provider. Gentle exercise is the best way to prevent or manage back pain.  Listen to your body when lifting. If lifting hurts, ask for help or bend your knees. This uses your leg muscles instead of your back muscles.  Squat down when picking up something from the floor. Do not bend over.  Only use bed rest for short periods as told by your health care provider. Bed rest should only be used for the most severe episodes of back pain. Standing, sitting, and lying down  Do not stand in one place for long periods of time.  Use good posture when sitting. Make sure your head rests over your shoulders and is not hanging forward. Use a pillow on your lower back if necessary.  Try sleeping on your side, preferably the left side, with a pregnancy support pillow or 1-2 regular pillows between your legs. ? If you have back pain after a night's rest, your bed may be too soft. ? A firm mattress may provide more support for your back during pregnancy. General instructions  Do not wear high heels.  Eat a healthy diet. Try to gain weight within your health care provider's recommendations.  Use a maternity girdle, elastic sling, or back brace as told by your health care provider.  Take over-the-counter and prescription medicines only as told by  your health care provider.  Work with a physical therapist or massage therapist to find ways to manage back pain. Acupuncture or massage therapy may be helpful.  Keep all follow-up visits as told by your health care provider. This is important. Contact a health care provider if:  Your back pain interferes with your daily activities.  You have increasing pain in other parts of your body. Get help right away if:  You develop numbness, tingling, weakness, or problems with the use of your arms or legs.  You develop severe back pain that is not controlled with medicine.  You have a change in bowel or bladder control.  You develop shortness  of breath, dizziness, or you faint.  You develop nausea, vomiting, or sweating.  You have back pain that is a rhythmic, cramping pain similar to labor pains. Labor pain is usually 1-2 minutes apart, lasts for about 1 minute, and involves a bearing down feeling or pressure in your pelvis.  You have back pain and your water breaks or you have vaginal bleeding.  You have back pain or numbness that travels down your leg.  Your back pain developed after you fell.  You develop pain on one side of your back.  You see blood in your urine.  You develop skin blisters in the area of your back pain. Summary  Back pain may be caused by several factors that are related to changes during your pregnancy.  Follow instructions as told by your health care provider for managing pain, stiffness, and swelling.  Exercise as told by your health care provider. Gentle exercise is the best way to prevent or manage back pain.  Take over-the-counter and prescription medicines only as told by your health care provider.  Keep all follow-up visits as told by your health care provider. This is important. This information is not intended to replace advice given to you by your health care provider. Make sure you discuss any questions you have with your health care  provider. Document Revised: 02/21/2019 Document Reviewed: 04/20/2018 Elsevier Patient Education  2020 [employer]. Third Trimester of Pregnancy  The third trimester is from week 28 through week 40 (months 7 through 9). This trimester is when your unborn baby (fetus) is growing very fast. At the end of the ninth month, the unborn baby is about 20 inches in length. It weighs about 6-10 pounds. Follow these instructions at home: Medicines  Take over-the-counter and prescription medicines only as told by your doctor. Some medicines are safe and some medicines are not safe during pregnancy.  Take a prenatal vitamin that contains at least 600 micrograms (mcg) of folic acid.  If you have trouble pooping (constipation), take medicine that will make your stool soft (stool softener) if your doctor approves. Eating and drinking   Eat regular, healthy meals.  Avoid raw meat and uncooked cheese.  If you get low calcium from the food you eat, talk to your doctor about taking a daily calcium supplement.  Eat four or five small meals rather than three large meals a day.  Avoid foods that are high in fat and sugars, such as fried and sweet foods.  To prevent constipation: ? Eat foods that are high in fiber, like fresh fruits and vegetables, whole grains, and beans. ? Drink enough fluids to keep your pee (urine) clear or pale yellow. Activity  Exercise only as told by your doctor. Stop exercising if you start to have cramps.  Avoid heavy lifting, wear low heels, and sit up straight.  Do not exercise if it is too hot, too humid, or if you are in a place of great height (high altitude).  You may continue to have sex unless your doctor tells you not to. Relieving pain and discomfort  Wear a good support bra if your breasts are tender.  Take frequent breaks and rest with your legs raised if you have leg cramps or low back pain.  Take warm water baths (sitz baths) to soothe pain or discomfort  caused by hemorrhoids. Use hemorrhoid cream if your doctor approves.  If you develop puffy, bulging veins (varicose veins) in your legs: ? Wear support hose or compression  stockings as told by your doctor. ? Raise (elevate) your feet for 15 minutes, 3-4 times a day. ? Limit salt in your food. Safety  Wear your seat belt when driving.  Make a list of emergency phone numbers, including numbers for family, friends, the hospital, and police and fire departments. Preparing for your baby's arrival To prepare for the arrival of your baby:  Take prenatal classes.  Practice driving to the hospital.  Visit the hospital and tour the maternity area.  Talk to your work about taking leave once the baby comes.  Pack your hospital bag.  Prepare the baby's room.  Go to your doctor visits.  Buy a rear-facing car seat. Learn how to install it in your car. General instructions  Do not use hot tubs, steam rooms, or saunas.  Do not use any products that contain nicotine or tobacco, such as cigarettes and e-cigarettes. If you need help quitting, ask your doctor.  Do not drink alcohol.  Do not douche or use tampons or scented sanitary pads.  Do not cross your legs for long periods of time.  Do not travel for long distances unless you must. Only do so if your doctor says it is okay.  Visit your dentist if you have not gone during your pregnancy. Use a soft toothbrush to brush your teeth. Be gentle when you floss.  Avoid cat litter boxes and soil used by cats. These carry germs that can cause birth defects in the baby and can cause a loss of your baby (miscarriage) or stillbirth.  Keep all your prenatal visits as told by your doctor. This is important. Contact a doctor if:  You are not sure if you are in labor or if your water has broken.  You are dizzy.  You have mild cramps or pressure in your lower belly.  You have a nagging pain in your belly area.  You continue to feel sick to  your stomach, you throw up, or you have watery poop.  You have bad smelling fluid coming from your vagina.  You have pain when you pee. Get help right away if:  You have a fever.  You are leaking fluid from your vagina.  You are spotting or bleeding from your vagina.  You have severe belly cramps or pain.  You lose or gain weight quickly.  You have trouble catching your breath and have chest pain.  You notice sudden or extreme puffiness (swelling) of your face, hands, ankles, feet, or legs.  You have not felt the baby move in over an hour.  You have severe headaches that do not go away with medicine.  You have trouble seeing.  You are leaking, or you are having a gush of fluid, from your vagina before you are 37 weeks.  You have regular belly spasms (contractions) before you are 37 weeks. Summary  The third trimester is from week 28 through week 40 (months 7 through 9). This time is when your unborn baby is growing very fast.  Follow your doctor's advice about medicine, food, and activity.  Get ready for the arrival of your baby by taking prenatal classes, getting all the baby items ready, preparing the baby's room, and visiting your doctor to be checked.  Get help right away if you are bleeding from your vagina, or you have chest pain and trouble catching your breath, or if you have not felt your baby move in over an hour. This information is not intended to replace advice  given to you by your health care provider. Make sure you discuss any questions you have with your health care provider. Document Revised: 02/23/2019 Document Reviewed: 12/08/2016 Elsevier Patient Education  2020 [employer]. Drospirenone tablets (contraception) What is this medicine? DROSPIRENONE (dro SPY re nown) is an oral contraceptive (birth control pill). The product contains a female hormone known as a progestin. It is used to prevent pregnancy. This medicine may be used for other purposes; ask  your health care provider or pharmacist if you have questions. COMMON BRAND NAME(S): Slynd What should I tell my health care provider before I take this medicine? They need to know if you have any of these conditions:  abnormal vaginal bleeding  adrenal gland disease  blood vessel disease or blood clots  breast, cervical, endometrial, ovarian, liver, or uterine cancer  diabetes  heart disease or recent heart attack  high potassium level  kidney disease  liver disease  mental depression  migraine headaches  stroke  an unusual or allergic reaction to drospirenone, progestins, or other medicines, foods, dyes, or preservatives  pregnant or trying to get pregnant  breast-feeding How should I use this medicine? Take this medicine by mouth. To reduce nausea, this medicine may be taken with food. Follow the directions on the prescription label. Take this medicine at the same time each day and in the order directed on the package. Do not take your medicine more often than directed. A patient package insert for the product will be given with each prescription and refill. Read this sheet carefully each time. The sheet may change frequently. Talk to your pediatrician regarding the use of this medicine in children. Special care may be needed. This medicine has been used in female children who have started having menstrual periods. Overdosage: If you think you have taken too much of this medicine contact a poison control center or emergency room at once. NOTE: This medicine is only for you. Do not share this medicine with others. What if I miss a dose? If you miss a dose, take it as soon as you can and refer to the patient information sheet you received with your medicine for direction. If you miss more than one pill, this medicine may not be as effective and you may need to use another form of birth control. What may interact with this medicine? Do not take this medicine with any of the  following medications:  atazanavir; cobicistat  bosentan  fosamprenavir This medicine may also interact with the following medications:  aprepitant  barbiturates like phenobarbital, primidone  carbamazepine  certain antibiotics like clarithromycin, rifampin, rifabutin, rifapentine  certain antivirals for HIV or hepatitis  certain diuretics like amiloride, spironolactone, triamterene  certain medicines for fungal infections like griseofulvin, ketoconazole, itraconazole, voriconazole  certain medicines for blood pressure, heart disease  cyclosporine  felbamate  heparin  medicines for diabetes  modafinil  NSAIDs, medicines for pain and inflammation, like ibuprofen or naproxen  oxcarbazepine  phenytoin  potassium supplements  rufinamide  St. John's wort  topiramate This list may not describe all possible interactions. Give your health care provider a list of all the medicines, herbs, non-prescription drugs, or dietary supplements you use. Also tell them if you smoke, drink alcohol, or use illegal drugs. Some items may interact with your medicine. What should I watch for while using this medicine? Visit your doctor or health care professional for regular checks on your progress. You will need a regular breast and pelvic exam and Pap smear while  on this medicine. You may need blood work done while you are taking this medicine. If you have any reason to think you are pregnant, stop taking this medicine right away and contact your doctor or health care professional. This medicine does not protect you against HIV infection (AIDS) or any other sexually transmitted diseases. If you are going to have elective surgery, you may need to stop taking this medicine before the surgery. Consult your health care professional for advice. What side effects may I notice from receiving this medicine? Side effects that you should report to your doctor or health care professional as soon  as possible:  allergic reactions like skin rash, itching or hives, swelling of the face, lips, or tongue  breast tissue changes or discharge  depressed mood  severe pain, swelling, or tenderness in the abdomen  signs and symptoms of a blood clot such as chest pain; shortness of breath; pain, swelling, or warmth in the leg  signs and symptoms of increased potassium like muscle weakness; chest pain; or fast, irregular heartbeat  signs and symptoms of liver injury like dark yellow or brown urine; general ill feeling or flu-like symptoms; light-colored stools; loss of appetite; nausea; right upper belly pain; unusually weak or tired; yellowing of the eyes or skin  signs and symptoms of a stroke like changes in vision; confusion; trouble speaking or understanding; severe headaches; sudden numbness or weakness of the face, arm or leg; trouble walking; dizziness; loss of balance or coordination  unusual vaginal bleeding  unusually weak or tired Side effects that usually do not require medical attention (report these to your doctor or health care professional if they continue or are bothersome):  acne  breast tenderness  headache  menstrual cramps  nausea  weight gain This list may not describe all possible side effects. Call your doctor for medical advice about side effects. You may report side effects to FDA at 1-800-FDA-1088. Where should I keep my medicine? Keep out of the reach of children. Store at room temperature between 20 and 25 degrees C (68 and 77 degrees F). Throw away any unused medicine after the expiration date. NOTE: This sheet is a summary. It may not cover all possible information. If you have questions about this medicine, talk to your doctor, pharmacist, or health care provider.  2020 Elsevier/Gold Standard (2018-04-13 15:01:56)

## 2020-01-11 NOTE — Progress Notes (Signed)
ROB-Patient c/o intermittent abdominal tightness and lower back pain x1 week, takes Tylenol with no relief.

## 2020-01-12 DIAGNOSIS — Z8759 Personal history of other complications of pregnancy, childbirth and the puerperium: Secondary | ICD-10-CM | POA: Insufficient documentation

## 2020-01-12 LAB — CBC
Hematocrit: 32.3 % — ABNORMAL LOW (ref 34.0–46.6)
Hemoglobin: 11 g/dL — ABNORMAL LOW (ref 11.1–15.9)
MCH: 30.6 pg (ref 26.6–33.0)
MCHC: 34.1 g/dL (ref 31.5–35.7)
MCV: 90 fL (ref 79–97)
Platelets: 141 10*3/uL — ABNORMAL LOW (ref 150–450)
RBC: 3.6 x10E6/uL — ABNORMAL LOW (ref 3.77–5.28)
RDW: 12 % (ref 11.7–15.4)
WBC: 10.6 10*3/uL (ref 3.4–10.8)

## 2020-01-12 LAB — RPR: RPR Ser Ql: NONREACTIVE

## 2020-01-12 LAB — GLUCOSE, 1 HOUR GESTATIONAL: Gestational Diabetes Screen: 123 mg/dL (ref 65–139)

## 2020-01-12 NOTE — Progress Notes (Signed)
ROB-Reports intermittent abdominal tightness and lower back pain for the last week. Denies relief with home treatment measures. Speculum exam: cervix-closed and thick. Vaginal swab and FFN collected, see orders. Third trimester handouts provided. 28 week labs collected. Plans IV/epidural, breastfeeding and POP as contraception. Naming daughter Raelynn. 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 Anticipatory guidance regarding course of prenatal care. Reviewed red flag symptoms. RTC x 2 weeks for ROB or sooner if needed.

## 2020-01-15 LAB — CERVICOVAGINAL ANCILLARY ONLY
Bacterial Vaginitis (gardnerella): NEGATIVE
Candida Glabrata: NEGATIVE
Candida Vaginitis: NEGATIVE
Chlamydia: NEGATIVE
Comment: NEGATIVE
Comment: NEGATIVE
Comment: NEGATIVE
Comment: NEGATIVE
Comment: NEGATIVE
Comment: NORMAL
Neisseria Gonorrhea: NEGATIVE
Trichomonas: NEGATIVE

## 2020-01-18 ENCOUNTER — Other Ambulatory Visit: Payer: Self-pay

## 2020-01-18 ENCOUNTER — Telehealth: Payer: Self-pay | Admitting: Certified Nurse Midwife

## 2020-01-18 ENCOUNTER — Encounter: Payer: Self-pay | Admitting: Certified Nurse Midwife

## 2020-01-18 ENCOUNTER — Other Ambulatory Visit: Payer: Self-pay | Admitting: Certified Nurse Midwife

## 2020-01-18 ENCOUNTER — Ambulatory Visit (INDEPENDENT_AMBULATORY_CARE_PROVIDER_SITE_OTHER): Payer: BC Managed Care – PPO | Admitting: Certified Nurse Midwife

## 2020-01-18 ENCOUNTER — Observation Stay
Admission: EM | Admit: 2020-01-18 | Discharge: 2020-01-18 | Disposition: A | Discharge status: Home / Self Care | Payer: BC Managed Care – PPO | Attending: Certified Nurse Midwife | Admitting: Certified Nurse Midwife

## 2020-01-18 ENCOUNTER — Other Ambulatory Visit (INDEPENDENT_AMBULATORY_CARE_PROVIDER_SITE_OTHER): Payer: BC Managed Care – PPO

## 2020-01-18 ENCOUNTER — Telehealth: Payer: Self-pay

## 2020-01-18 VITALS — BP 92/55 | HR 91 | Wt 162.1 lb

## 2020-01-18 DIAGNOSIS — R8789 Other abnormal findings in specimens from female genital organs: Secondary | ICD-10-CM

## 2020-01-18 DIAGNOSIS — Z3A29 29 weeks gestation of pregnancy: Secondary | ICD-10-CM

## 2020-01-18 DIAGNOSIS — O26893 Other specified pregnancy related conditions, third trimester: Secondary | ICD-10-CM

## 2020-01-18 DIAGNOSIS — Z3493 Encounter for supervision of normal pregnancy, unspecified, third trimester: Secondary | ICD-10-CM

## 2020-01-18 DIAGNOSIS — O288 Other abnormal findings on antenatal screening of mother: Secondary | ICD-10-CM | POA: Diagnosis not present

## 2020-01-18 DIAGNOSIS — Z3689 Encounter for other specified antenatal screening: Secondary | ICD-10-CM | POA: Diagnosis not present

## 2020-01-18 DIAGNOSIS — R6889 Other general symptoms and signs: Secondary | ICD-10-CM | POA: Diagnosis present

## 2020-01-18 LAB — WET PREP, GENITAL
Clue Cells Wet Prep HPF POC: NONE SEEN
Sperm: NONE SEEN
Trich, Wet Prep: NONE SEEN
Yeast Wet Prep HPF POC: NONE SEEN

## 2020-01-18 LAB — POCT URINALYSIS DIPSTICK OB
Bilirubin, UA: NEGATIVE
Glucose, UA: NEGATIVE
Leukocytes, UA: NEGATIVE
Nitrite, UA: NEGATIVE
POC,PROTEIN,UA: NEGATIVE
Spec Grav, UA: 1.025 (ref 1.010–1.025)
Urobilinogen, UA: 0.2 E.U./dL
pH, UA: 5 (ref 5.0–8.0)

## 2020-01-18 LAB — RUPTURE OF MEMBRANE (ROM)PLUS: Rom Plus: NEGATIVE

## 2020-01-18 MED ORDER — ACETAMINOPHEN 500 MG PO TABS
1000.0000 mg | ORAL_TABLET | Freq: Four times a day (QID) | ORAL | Status: AC | PRN
  Administered 2020-01-18: 1000 mg via ORAL
  Filled 2020-01-18: qty 2

## 2020-01-18 MED ORDER — HYDROXYZINE HCL 25 MG PO TABS
25.0000 mg | ORAL_TABLET | Freq: Once | ORAL | Status: AC
  Administered 2020-01-18: 25 mg via ORAL
  Filled 2020-01-18 (×2): qty 1

## 2020-01-18 NOTE — Telephone Encounter (Signed)
Pt is requesting a call back. Possibly water break. Pt was sitting down got up and arush of water came out the pt is requesting a call back. Please advise

## 2020-01-18 NOTE — OB Triage Note (Signed)
Pt is a 23yo G2P1 at [redacted]w[redacted]d that was sent over from the office for leaking of fluid. Pt states she noticed LOF today around 1030. Pt was seen in office and had a neg Fern, and no pooling on spec exam but had a positive nitrazine. Pt denies VB and states decreased FM. Monitors applied and initial fht 155. VSS. Will continue to monitor.

## 2020-01-18 NOTE — Telephone Encounter (Signed)
mychart message sent to patient

## 2020-01-18 NOTE — Patient Instructions (Signed)
Fetal Movement Counts Patient Name: ________________________________________________ Patient Due Date: ____________________ What is a fetal movement count?  A fetal movement count is the number of times that you feel your baby move during a certain amount of time. This may also be called a fetal kick count. A fetal movement count is recommended for every pregnant woman. You may be asked to start counting fetal movements as early as week 28 of your pregnancy. Pay attention to when your baby is most active. You may notice your baby's sleep and wake cycles. You may also notice things that make your baby move more. You should do a fetal movement count:  When your baby is normally most active.  At the same time each day. A good time to count movements is while you are resting, after having something to eat and drink. How do I count fetal movements? 1. Find a quiet, comfortable area. Sit, or lie down on your side. 2. Write down the date, the start time and stop time, and the number of movements that you felt between those two times. Take this information with you to your health care visits. 3. Write down your start time when you feel the first movement. 4. Count kicks, flutters, swishes, rolls, and jabs. You should feel at least 10 movements. 5. You may stop counting after you have felt 10 movements, or if you have been counting for 2 hours. Write down the stop time. 6. If you do not feel 10 movements in 2 hours, contact your health care provider for further instructions. Your health care provider may want to do additional tests to assess your baby's well-being. Contact a health care provider if:  You feel fewer than 10 movements in 2 hours.  Your baby is not moving like he or she usually does. Date: ____________ Start time: ____________ Stop time: ____________ Movements: ____________ Date: ____________ Start time: ____________ Stop time: ____________ Movements: ____________ Date: ____________  Start time: ____________ Stop time: ____________ Movements: ____________ Date: ____________ Start time: ____________ Stop time: ____________ Movements: ____________ Date: ____________ Start time: ____________ Stop time: ____________ Movements: ____________ Date: ____________ Start time: ____________ Stop time: ____________ Movements: ____________ Date: ____________ Start time: ____________ Stop time: ____________ Movements: ____________ Date: ____________ Start time: ____________ Stop time: ____________ Movements: ____________ Date: ____________ Start time: ____________ Stop time: ____________ Movements: ____________ This information is not intended to replace advice given to you by your health care provider. Make sure you discuss any questions you have with your health care provider. Document Revised: 06/22/2019 Document Reviewed: 06/22/2019 Elsevier Patient Education  2020 [employer] Inc[employer]  

## 2020-01-18 NOTE — Discharge Instructions (Signed)
Braxton Hicks Contractions Contractions of the uterus can occur throughout pregnancy, but they are not always a sign that you are in labor. You may have practice contractions called Braxton Hicks contractions. These false labor contractions are sometimes confused with true labor. What are Braxton Hicks contractions? Braxton Hicks contractions are tightening movements that occur in the muscles of the uterus before labor. Unlike true labor contractions, these contractions do not result in opening (dilation) and thinning of the cervix. Toward the end of pregnancy (32-34 weeks), Braxton Hicks contractions can happen more often and may become stronger. These contractions are sometimes difficult to tell apart from true labor because they can be very uncomfortable. You should not feel embarrassed if you go to the hospital with false labor. Sometimes, the only way to tell if you are in true labor is for your health care provider to look for changes in the cervix. The health care provider will do a physical exam and may monitor your contractions. If you are not in true labor, the exam should show that your cervix is not dilating and your water has not broken. If there are no other health problems associated with your pregnancy, it is completely safe for you to be sent home with false labor. You may continue to have Braxton Hicks contractions until you go into true labor. How to tell the difference between true labor and false labor True labor  Contractions last 30-70 seconds.  Contractions become very regular.  Discomfort is usually felt in the top of the uterus, and it spreads to the lower abdomen and low back.  Contractions do not go away with walking.  Contractions usually become more intense and increase in frequency.  The cervix dilates and gets thinner. False labor  Contractions are usually shorter and not as strong as true labor contractions.  Contractions are usually irregular.  Contractions  are often felt in the front of the lower abdomen and in the groin.  Contractions may go away when you walk around or change positions while lying down.  Contractions get weaker and are shorter-lasting as time goes on.  The cervix usually does not dilate or become thin. Follow these instructions at home:   Take over-the-counter and prescription medicines only as told by your health care provider.  Keep up with your usual exercises and follow other instructions from your health care provider.  Eat and drink lightly if you think you are going into labor.  If Braxton Hicks contractions are making you uncomfortable: ? Change your position from lying down or resting to walking, or change from walking to resting. ? Sit and rest in a tub of warm water. ? Drink enough fluid to keep your urine pale yellow. Dehydration may cause these contractions. ? Do slow and deep breathing several times an hour.  Keep all follow-up prenatal visits as told by your health care provider. This is important. Contact a health care provider if:  You have a fever.  You have continuous pain in your abdomen. Get help right away if:  Your contractions become stronger, more regular, and closer together.  You have fluid leaking or gushing from your vagina.  You pass blood-tinged mucus (bloody show).  You have bleeding from your vagina.  You have low back pain that you never had before.  You feel your baby's head pushing down and causing pelvic pressure.  Your baby is not moving inside you as much as it used to. Summary  Contractions that occur before labor are   called Braxton Hicks contractions, false labor, or practice contractions.  Braxton Hicks contractions are usually shorter, weaker, farther apart, and less regular than true labor contractions. True labor contractions usually become progressively stronger and regular, and they become more frequent.  Manage discomfort from Braxton Hicks contractions  by changing position, resting in a warm bath, drinking plenty of water, or practicing deep breathing. This information is not intended to replace advice given to you by your health care provider. Make sure you discuss any questions you have with your health care provider. Document Revised: 10/15/2017 Document Reviewed: 03/18/2017 Elsevier Patient Education  2020 [employer] Fetal Movement Counts Patient Name: ________________________________________________ Patient Due Date: ____________________ What is a fetal movement count?  A fetal movement count is the number of times that you feel your baby move during a certain amount of time. This may also be called a fetal kick count. A fetal movement count is recommended for every pregnant woman. You may be asked to start counting fetal movements as early as week 28 of your pregnancy. Pay attention to when your baby is most active. You may notice your baby's sleep and wake cycles. You may also notice things that make your baby move more. You should do a fetal movement count:  When your baby is normally most active.  At the same time each day. A good time to count movements is while you are resting, after having something to eat and drink. How do I count fetal movements? 1. Find a quiet, comfortable area. Sit, or lie down on your side. 2. Write down the date, the start time and stop time, and the number of movements that you felt between those two times. Take this information with you to your health care visits. 3. Write down your start time when you feel the first movement. 4. Count kicks, flutters, swishes, rolls, and jabs. You should feel at least 10 movements. 5. You may stop counting after you have felt 10 movements, or if you have been counting for 2 hours. Write down the stop time. 6. If you do not feel 10 movements in 2 hours, contact your health care provider for further instructions. Your health care provider may want to do additional tests  to assess your baby's well-being. Contact a health care provider if:  You feel fewer than 10 movements in 2 hours.  Your baby is not moving like he or she usually does. Date: ____________ Start time: ____________ Stop time: ____________ Movements: ____________ Date: ____________ Start time: ____________ Stop time: ____________ Movements: ____________ Date: ____________ Start time: ____________ Stop time: ____________ Movements: ____________ Date: ____________ Start time: ____________ Stop time: ____________ Movements: ____________ Date: ____________ Start time: ____________ Stop time: ____________ Movements: ____________ Date: ____________ Start time: ____________ Stop time: ____________ Movements: ____________ Date: ____________ Start time: ____________ Stop time: ____________ Movements: ____________ Date: ____________ Start time: ____________ Stop time: ____________ Movements: ____________ Date: ____________ Start time: ____________ Stop time: ____________ Movements: ____________ This information is not intended to replace advice given to you by your health care provider. Make sure you discuss any questions you have with your health care provider. Document Revised: 06/22/2019 Document Reviewed: 06/22/2019 Elsevier Patient Education  2020 Elsevier Inc.  

## 2020-01-18 NOTE — Progress Notes (Signed)
RN collected ROM+, wet prep and fern. Crist Fat results were negative. Will update CNM when other results post.

## 2020-01-18 NOTE — Progress Notes (Signed)
CNM given pt update and discharge orders given.

## 2020-01-18 NOTE — OB Triage Note (Addendum)
Discharge instructions reviewed with pt. Pt verbalized understanding of instructions and has no further questions at this time. Pt instructed when to return to hospital for evaluation and to keep her OB appointment that is already scheduled. Pt discharged in stable condition with Husband.

## 2020-01-18 NOTE — Progress Notes (Signed)
Subjective:   Alexandra Joseph is a 24 y.o. G2P1001 [redacted]w[redacted]d being seen today for work in problem visit in pregnancy.   Patient "felt a gush of fluid and started having cramps" after using the restroom at work today.    Denies contractions or vaginal bleeding.  Reports good fetal movement.  Last intercourse early this morning.   Denies difficulty breathing or respiratory distress, chest pain, abdominal pain, vaginal bleeding, dysuria, leg pain or swelling.   The following portions of the patient's history were reviewed and updated as appropriate: allergies, current medications, past family history, past medical history, past social history, past surgical history and problem list.   Review of Systems:  ROS negative except as noted above. Information obtained from patient.   Objective:   BP (!) 92/55   Pulse 91   Wt 162 lb 2 oz (73.5 kg)   LMP 06/24/2019   BMI 31.66 kg/m   FHT: Fetal Heart Rate (bpm): 156  Uterine Size: Fundal Height: 30 cm  Fetal Movement: Movement: Present  Presentation: Presentation: Vertex    Abdomen:  soft, gravid, appropriate for gestational age,non-tender  Vaginal:  Discharge, clear and watery    Cervix: Closed,Long on speculum exam; nitrazine positive, negative fern and pooling   ULTRASOUND REPORT  Location: [facility] Date of Service: 01/18/2020   Indications:AFI  Findings:  Mason Jim intrauterine pregnancy is visualized with FHR at 156 BPM.  Fetal presentation is Cephalic.  Placenta: posterior. Grade: 1 AFI: 13.4 cm  Impression: 1. [redacted]w[redacted]d Viable Singleton Intrauterine pregnancy dated by previously established criteria. 2. AFI is 13.4 cm.   Recommendations: 1.Clinical correlation with the patient's History and Physical Exam.  Results for orders placed or performed in visit on 01/18/20 (from the past 24 hour(s))  POC Urinalysis Dipstick OB     Status: None   Collection Time: 01/18/20  3:28 PM  Result Value Ref Range   Color, UA  yellow    Clarity, UA clear    Glucose, UA Negative Negative   Bilirubin, UA neg    Ketones, UA large    Spec Grav, UA 1.025 1.010 - 1.025   Blood, UA large    pH, UA 5.0 5.0 - 8.0   POC,PROTEIN,UA Negative Negative, Trace, Small (1+), Moderate (2+), Large (3+), 4+   Urobilinogen, UA 0.2 0.2 or 1.0 E.U./dL   Nitrite, UA neg    Leukocytes, UA Negative Negative   Appearance     Odor      Assessment:   Pregnancy:  G2P1001 at 106w3d  1. Third trimester pregnancy  - POC Urinalysis Dipstick OB - Amniotic fluid index  2. Positive nitrazine test  - Amniotic fluid index  Plan:   Discussed results will patient, agrees to go to [facility] for ROM Plus. Report call to [employer], Consulting civil engineer.   Follow up as indicated.    Gunnar Bulla, CNM [facility], [facility] 01/18/20 11:41 PM'

## 2020-01-21 NOTE — Discharge Summary (Signed)
Obstetric Discharge Summary  Patient ID: Alexandra Joseph MRN: [id number] DOB/AGE: 1995/12/21 24 y.o.   Date of Admission: 01/18/2020  Date of Discharge: 01/18/2020  Admitting Diagnosis: Observation at [redacted]w[redacted]d  Secondary Diagnosis: Positive Nitrazine Test     Discharge Diagnosis: No other diagnosis   Antepartum Procedures: NST and ROM Plus   Brief Hospital Course   L&D OB Triage Note  AEDYN Joseph is a 24 y.o. G75P1001 female at [redacted]w[redacted]d, EDD Estimated Date of Delivery: 04/01/20 who presented to triage for further evaluation from office after positive nitrazine test.  She was evaluated by the nurses with no significant findings for spontaneous rupture of membranes, preterm labor, or fetal distress. Vital signs stable. An NST was performed and has been reviewed by CNM. Fern, wet prep and ROM Plus were all negative. No treatment was necessary.   NST INTERPRETATION: Indications: rule out uterine contractions  Mode: External Baseline Rate (A): 145 bpm(fht) Variability: Moderate Accelerations: 15 x 15 Decelerations: None     Contraction Frequency (min): Irregular with UI  Impression: reactive   Plan: NST performed was reviewed and was found to be reactive. She was discharged home with bleeding/labor precautions.  Continue routine prenatal care. Follow up with CNM as previously scheduled.    Discharge Instructions: Per After Visit Summary.  Activity: Also refer to After Visit Summary.  Diet: Regular  Medications: Allergies as of 01/18/2020   No Known Allergies     Medication List    TAKE these medications   prenatal multivitamin Tabs tablet Take 1 tablet by mouth daily at 12 noon.      Outpatient follow up:  Follow-up Information    [facility] Follow up.   Why: Please keep appointment as scheduled  Contact information: [address]  [address] [place] [address] [phone]         Postpartum contraception: oral  progesterone-only contraceptive  Discharged Condition: stable  Discharged to: home   Gunnar Bulla, CNM [facility], [facility]

## 2020-01-22 ENCOUNTER — Observation Stay
Admission: EM | Admit: 2020-01-22 | Discharge: 2020-01-22 | Disposition: A | Discharge status: Home / Self Care | Payer: BC Managed Care – PPO | Attending: Certified Nurse Midwife | Admitting: Certified Nurse Midwife

## 2020-01-22 ENCOUNTER — Encounter: Payer: Self-pay | Admitting: Certified Nurse Midwife

## 2020-01-22 ENCOUNTER — Other Ambulatory Visit: Payer: Self-pay

## 2020-01-22 DIAGNOSIS — Z3A30 30 weeks gestation of pregnancy: Secondary | ICD-10-CM | POA: Insufficient documentation

## 2020-01-22 DIAGNOSIS — Z0379 Encounter for other suspected maternal and fetal conditions ruled out: Secondary | ICD-10-CM | POA: Diagnosis present

## 2020-01-22 DIAGNOSIS — O4193X0 Disorder of amniotic fluid and membranes, unspecified, third trimester, not applicable or unspecified: Secondary | ICD-10-CM

## 2020-01-22 LAB — URINALYSIS, COMPLETE (UACMP) WITH MICROSCOPIC
Bilirubin Urine: NEGATIVE
Glucose, UA: NEGATIVE mg/dL
Hgb urine dipstick: NEGATIVE
Ketones, ur: NEGATIVE mg/dL
Nitrite: NEGATIVE
Protein, ur: NEGATIVE mg/dL
Specific Gravity, Urine: 1.006 (ref 1.005–1.030)
pH: 7 (ref 5.0–8.0)

## 2020-01-22 LAB — WET PREP, GENITAL
Clue Cells Wet Prep HPF POC: NONE SEEN
Sperm: NONE SEEN
Trich, Wet Prep: NONE SEEN
Yeast Wet Prep HPF POC: NONE SEEN

## 2020-01-22 LAB — RUPTURE OF MEMBRANE (ROM)PLUS: Rom Plus: NEGATIVE

## 2020-01-22 NOTE — Progress Notes (Addendum)
Pt discharged home per M.Lawhorn, CNM order. Pt given AVS and discharge instructions. NST reactive and appropriate for gestational age. Pt received a work Physicist, medical and encouraged to rest at home, soak in warm water, and take tylenol PM. Labor s/s reviewed with pt. Pt has follow-up appointment with A.Janee Morn, CNM 01/30/2020. All questions answered by RN and pt verbalized understanding. Pt d/c home via personal vehicle.

## 2020-01-22 NOTE — OB Triage Note (Signed)
Pt presents to the ED c/o leaking of fluid and contractions. Pt is a 23 y.o G2P1001 at [redacted]w[redacted]d. Pt denies vaginal bleeding and states positive fetal movement. Pt reports leaking of fluid since Thursday it has stopped and then leaking has started again on Saturday. Pt reports thin watery consistency leaking that has saturated "a few pads." ROM plus and wet prep collected, RN noted white discharge. External monitors applied and assessing, initial FHR 165. VSS.

## 2020-01-22 NOTE — Discharge Summary (Signed)
Physician Obstetric Discharge Summary  Patient ID: Alexandra Joseph MRN: [id number] DOB/AGE: 03/16/1996 24 y.o.   Date of Admission: 01/22/2020  Date of Discharge: 01/22/2020  Admitting Diagnosis: Observation at [redacted]w[redacted]d     Discharge Diagnosis: No other diagnosis   Antepartum Procedures: NST, Freddie Apley Prep and ROM PLUS   Brief Hospital Course   L&D OB Triage Note  Alexandra Joseph is a 24 y.o. G40P1001 female at 104w0d, EDD Estimated Date of Delivery: 04/01/20 who presented to triage for complaints of leakage of fluid and contractions.  She was evaluated by the nurses with no significant findings for preterm labor, fetal distress, and spontaneous rupture of membranes. Vital signs stable. An NST was performed and has been reviewed by CNM. Her fetal fibronectin, fern, and wet prep were negative.   NST INTERPRETATION:  Indications: rule out uterine contractions  Mode: External Baseline Rate (A): 145 bpm Variability: Moderate Accelerations: 15 x 15 Decelerations: None Contraction Frequency (min): none noted  Impression: reactive   Plan: NST performed was reviewed and was found to be reactive. She was discharged home with bleeding/labor precautions.  Continue routine prenatal care. Follow up with OB/GYN as previously scheduled.    Discharge Instructions: Per After Visit Summary.  Activity: Also refer to After Visit Summary.  Diet: Regular  Medications: No changes to current medications  Postpartum contraception: oral progesterone-only contraceptive  Discharged Condition: stable  Discharged to: home   Gunnar Bulla, CNM [facility], [facility] 01/22/20 8:46 PM

## 2020-01-23 LAB — URINE CULTURE: Culture: <10000 — AB

## 2020-01-30 ENCOUNTER — Other Ambulatory Visit: Payer: Self-pay

## 2020-01-30 ENCOUNTER — Ambulatory Visit (INDEPENDENT_AMBULATORY_CARE_PROVIDER_SITE_OTHER): Payer: BC Managed Care – PPO | Admitting: Certified Nurse Midwife

## 2020-01-30 VITALS — BP 115/66 | HR 105 | Wt 163.5 lb

## 2020-01-30 DIAGNOSIS — Z3493 Encounter for supervision of normal pregnancy, unspecified, third trimester: Secondary | ICD-10-CM

## 2020-01-30 DIAGNOSIS — R002 Palpitations: Secondary | ICD-10-CM

## 2020-01-30 DIAGNOSIS — R519 Headache, unspecified: Secondary | ICD-10-CM

## 2020-01-30 DIAGNOSIS — O26893 Other specified pregnancy related conditions, third trimester: Secondary | ICD-10-CM

## 2020-01-30 NOTE — Patient Instructions (Signed)
[facility]  [facility]  [address], [place], [place] [address]  Phone: [phone]  [facility] (second location)  [address] [place], [place] [address]  Phone: [phone]  [facility] ([place]) [address], [place], [place] [address] Phone: [phone]  [facility]  [address] , [place] [address]  Phone: [phone] 

## 2020-01-30 NOTE — Progress Notes (Signed)
ROB doing well. Feels movement. States she got light headed and fell backwards this past week. She complains of also having palpitations. Discussed red flag symptoms, cardio consult ordered. Pt continues to have increased discharge but denies that anything has is different. 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 She request to stay out of work the remainder of her pregnancy. She state that she has request light duty and to not be assigned to combative pts but that they are not accommodating her. She is part time and states that FMLA is not a factor. Note given. She also complains of swelling, headaches. Reflexes are 2+ bilaterally, negative clonus. Orders placed for pre E labs. Will follow up with results. Return in 2 wks for ROB with Marcelino Duster.   [name], CNM

## 2020-01-31 ENCOUNTER — Telehealth: Payer: Self-pay

## 2020-01-31 ENCOUNTER — Telehealth: Payer: Self-pay | Admitting: Certified Nurse Midwife

## 2020-01-31 ENCOUNTER — Other Ambulatory Visit: Payer: Self-pay | Admitting: Certified Nurse Midwife

## 2020-01-31 LAB — CBC WITH DIFFERENTIAL/PLATELET
Basophils Absolute: 0 10*3/uL (ref 0.0–0.2)
Basos: 0 %
EOS (ABSOLUTE): 0.1 10*3/uL (ref 0.0–0.4)
Eos: 1 %
Hematocrit: 31.7 % — ABNORMAL LOW (ref 34.0–46.6)
Hemoglobin: 10.8 g/dL — ABNORMAL LOW (ref 11.1–15.9)
Immature Grans (Abs): 0.3 10*3/uL — ABNORMAL HIGH (ref 0.0–0.1)
Immature Granulocytes: 3 %
Lymphocytes Absolute: 1.4 10*3/uL (ref 0.7–3.1)
Lymphs: 13 %
MCH: 29.3 pg (ref 26.6–33.0)
MCHC: 34.1 g/dL (ref 31.5–35.7)
MCV: 86 fL (ref 79–97)
Monocytes Absolute: 0.7 10*3/uL (ref 0.1–0.9)
Monocytes: 7 %
Neutrophils Absolute: 8 10*3/uL — ABNORMAL HIGH (ref 1.4–7.0)
Neutrophils: 76 %
Platelets: 127 10*3/uL — ABNORMAL LOW (ref 150–450)
RBC: 3.69 x10E6/uL — ABNORMAL LOW (ref 3.77–5.28)
RDW: 12.3 % (ref 11.7–15.4)
WBC: 10.4 10*3/uL (ref 3.4–10.8)

## 2020-01-31 LAB — COMPREHENSIVE METABOLIC PANEL
ALT: 6 IU/L (ref 0–32)
AST: 14 IU/L (ref 0–40)
Albumin/Globulin Ratio: 1.6 (ref 1.2–2.2)
Albumin: 3.6 g/dL — ABNORMAL LOW (ref 3.9–5.0)
Alkaline Phosphatase: 80 IU/L (ref 39–117)
BUN/Creatinine Ratio: 7 — ABNORMAL LOW (ref 9–23)
BUN: 3 mg/dL — ABNORMAL LOW (ref 6–20)
Bilirubin Total: 0.3 mg/dL (ref 0.0–1.2)
CO2: 19 mmol/L — ABNORMAL LOW (ref 20–29)
Calcium: 8.8 mg/dL (ref 8.7–10.2)
Chloride: 105 mmol/L (ref 96–106)
Creatinine, Ser: 0.46 mg/dL — ABNORMAL LOW (ref 0.57–1.00)
GFR calc Af Amer: 163 mL/min/{1.73_m2} (ref >59)
GFR calc non Af Amer: 142 mL/min/{1.73_m2} (ref >59)
Globulin, Total: 2.2 g/dL (ref 1.5–4.5)
Glucose: 70 mg/dL (ref 65–99)
Potassium: 4.1 mmol/L (ref 3.5–5.2)
Sodium: 139 mmol/L (ref 134–144)
Total Protein: 5.8 g/dL — ABNORMAL LOW (ref 6.0–8.5)

## 2020-01-31 LAB — PROTEIN / CREATININE RATIO, URINE
Creatinine, Urine: 200.5 mg/dL
Protein, Ur: 19.1 mg/dL
Protein/Creat Ratio: 95 mg/g creat (ref 0–200)

## 2020-01-31 MED ORDER — FUSION PLUS PO CAPS: 1.0000 | ORAL_CAPSULE | Freq: Every day | ORAL | 4 refills | Status: DC

## 2020-01-31 NOTE — Telephone Encounter (Signed)
Mychart message sent to patient requesting a fax number.

## 2020-01-31 NOTE — Telephone Encounter (Signed)
Pt called in and stated that she needs a leave of absence for her job sent [employer]. Pt stated she didn't have the fax number. The pt is requesting a call back.

## 2020-02-01 ENCOUNTER — Telehealth: Payer: Self-pay

## 2020-02-01 NOTE — Telephone Encounter (Signed)
Pt called in and stated that she got the fax number for [employer] [phone]

## 2020-02-01 NOTE — Telephone Encounter (Signed)
mychart message sent to patient- letter faxed to [employer] and confirmation received.

## 2020-02-06 ENCOUNTER — Ambulatory Visit: Payer: BC Managed Care – PPO | Admitting: Cardiovascular Disease

## 2020-02-15 ENCOUNTER — Other Ambulatory Visit: Payer: Self-pay

## 2020-02-15 ENCOUNTER — Ambulatory Visit (INDEPENDENT_AMBULATORY_CARE_PROVIDER_SITE_OTHER): Payer: BC Managed Care – PPO | Admitting: Certified Nurse Midwife

## 2020-02-15 VITALS — BP 120/66 | HR 103 | Wt 165.4 lb

## 2020-02-15 DIAGNOSIS — Z3A33 33 weeks gestation of pregnancy: Secondary | ICD-10-CM

## 2020-02-15 DIAGNOSIS — O99119 Other diseases of the blood and blood-forming organs and certain disorders involving the immune mechanism complicating pregnancy, unspecified trimester: Secondary | ICD-10-CM

## 2020-02-15 DIAGNOSIS — D696 Thrombocytopenia, unspecified: Secondary | ICD-10-CM

## 2020-02-15 DIAGNOSIS — Z3493 Encounter for supervision of normal pregnancy, unspecified, third trimester: Secondary | ICD-10-CM

## 2020-02-15 NOTE — Patient Instructions (Addendum)
Third Trimester of Pregnancy  The third trimester is from week 28 through week 40 (months 7 through 9). This trimester is when your unborn baby (fetus) is growing very fast. At the end of the ninth month, the unborn baby is about 20 inches in length. It weighs about 6-10 pounds. Follow these instructions at home: Medicines  Take over-the-counter and prescription medicines only as told by your doctor. Some medicines are safe and some medicines are not safe during pregnancy.  Take a prenatal vitamin that contains at least 600 micrograms (mcg) of folic acid.  If you have trouble pooping (constipation), take medicine that will make your stool soft (stool softener) if your doctor approves. Eating and drinking   Eat regular, healthy meals.  Avoid raw meat and uncooked cheese.  If you get low calcium from the food you eat, talk to your doctor about taking a daily calcium supplement.  Eat four or five small meals rather than three large meals a day.  Avoid foods that are high in fat and sugars, such as fried and sweet foods.  To prevent constipation: ? Eat foods that are high in fiber, like fresh fruits and vegetables, whole grains, and beans. ? Drink enough fluids to keep your pee (urine) clear or pale yellow. Activity  Exercise only as told by your doctor. Stop exercising if you start to have cramps.  Avoid heavy lifting, wear low heels, and sit up straight.  Do not exercise if it is too hot, too humid, or if you are in a place of great height (high altitude).  You may continue to have sex unless your doctor tells you not to. Relieving pain and discomfort  Wear a good support bra if your breasts are tender.  Take frequent breaks and rest with your legs raised if you have leg cramps or low back pain.  Take warm water baths (sitz baths) to soothe pain or discomfort caused by hemorrhoids. Use hemorrhoid cream if your doctor approves.  If you develop puffy, bulging veins (varicose  veins) in your legs: ? Wear support hose or compression stockings as told by your doctor. ? Raise (elevate) your feet for 15 minutes, 3-4 times a day. ? Limit salt in your food. Safety  Wear your seat belt when driving.  Make a list of emergency phone numbers, including numbers for family, friends, the hospital, and police and fire departments. Preparing for your baby's arrival To prepare for the arrival of your baby:  Take prenatal classes.  Practice driving to the hospital.  Visit the hospital and tour the maternity area.  Talk to your work about taking leave once the baby comes.  Pack your hospital bag.  Prepare the baby's room.  Go to your doctor visits.  Buy a rear-facing car seat. Learn how to install it in your car. General instructions  Do not use hot tubs, steam rooms, or saunas.  Do not use any products that contain nicotine or tobacco, such as cigarettes and e-cigarettes. If you need help quitting, ask your doctor.  Do not drink alcohol.  Do not douche or use tampons or scented sanitary pads.  Do not cross your legs for long periods of time.  Do not travel for long distances unless you must. Only do so if your doctor says it is okay.  Visit your dentist if you have not gone during your pregnancy. Use a soft toothbrush to brush your teeth. Be gentle when you floss.  Avoid cat litter boxes and soil   used by cats. These carry germs that can cause birth defects in the baby and can cause a loss of your baby (miscarriage) or stillbirth.  Keep all your prenatal visits as told by your doctor. This is important. Contact a doctor if:  You are not sure if you are in labor or if your water has broken.  You are dizzy.  You have mild cramps or pressure in your lower belly.  You have a nagging pain in your belly area.  You continue to feel sick to your stomach, you throw up, or you have watery poop.  You have bad smelling fluid coming from your vagina.  You have  pain when you pee. Get help right away if:  You have a fever.  You are leaking fluid from your vagina.  You are spotting or bleeding from your vagina.  You have severe belly cramps or pain.  You lose or gain weight quickly.  You have trouble catching your breath and have chest pain.  You notice sudden or extreme puffiness (swelling) of your face, hands, ankles, feet, or legs.  You have not felt the baby move in over an hour.  You have severe headaches that do not go away with medicine.  You have trouble seeing.  You are leaking, or you are having a gush of fluid, from your vagina before you are 37 weeks.  You have regular belly spasms (contractions) before you are 37 weeks. Summary  The third trimester is from week 28 through week 40 (months 7 through 9). This time is when your unborn baby is growing very fast.  Follow your doctor's advice about medicine, food, and activity.  Get ready for the arrival of your baby by taking prenatal classes, getting all the baby items ready, preparing the baby's room, and visiting your doctor to be checked.  Get help right away if you are bleeding from your vagina, or you have chest pain and trouble catching your breath, or if you have not felt your baby move in over an hour. This information is not intended to replace advice given to you by your health care provider. Make sure you discuss any questions you have with your health care provider. Document Revised: 02/23/2019 Document Reviewed: 12/08/2016 Elsevier Patient Education  2020 Elsevier Inc.   Fetal Movement Counts Patient Name: ________________________________________________ Patient Due Date: ____________________ What is a fetal movement count?  A fetal movement count is the number of times that you feel your baby move during a certain amount of time. This may also be called a fetal kick count. A fetal movement count is recommended for every pregnant woman. You may be asked to  start counting fetal movements as early as week 28 of your pregnancy. Pay attention to when your baby is most active. You may notice your baby's sleep and wake cycles. You may also notice things that make your baby move more. You should do a fetal movement count:  When your baby is normally most active.  At the same time each day. A good time to count movements is while you are resting, after having something to eat and drink. How do I count fetal movements? 1. Find a quiet, comfortable area. Sit, or lie down on your side. 2. Write down the date, the start time and stop time, and the number of movements that you felt between those two times. Take this information with you to your health care visits. 3. Write down your start time when you feel   the first movement. 4. Count kicks, flutters, swishes, rolls, and jabs. You should feel at least 10 movements. 5. You may stop counting after you have felt 10 movements, or if you have been counting for 2 hours. Write down the stop time. 6. If you do not feel 10 movements in 2 hours, contact your health care provider for further instructions. Your health care provider may want to do additional tests to assess your baby's well-being. Contact a health care provider if:  You feel fewer than 10 movements in 2 hours.  Your baby is not moving like he or she usually does. Date: ____________ Start time: ____________ Stop time: ____________ Movements: ____________ Date: ____________ Start time: ____________ Stop time: ____________ Movements: ____________ Date: ____________ Start time: ____________ Stop time: ____________ Movements: ____________ Date: ____________ Start time: ____________ Stop time: ____________ Movements: ____________ Date: ____________ Start time: ____________ Stop time: ____________ Movements: ____________ Date: ____________ Start time: ____________ Stop time: ____________ Movements: ____________ Date: ____________ Start time: ____________  Stop time: ____________ Movements: ____________ Date: ____________ Start time: ____________ Stop time: ____________ Movements: ____________ Date: ____________ Start time: ____________ Stop time: ____________ Movements: ____________ This information is not intended to replace advice given to you by your health care provider. Make sure you discuss any questions you have with your health care provider. Document Revised: 06/22/2019 Document Reviewed: 06/22/2019 Elsevier Patient Education  2020 Else[employer] Inc.   [employer] of the uterus can occur throughout pregnancy, but they are not always a sign that you are in labor. You may have practice contractions called Braxton Hicks contractions. These false labor contractions are sometimes confused with true labor. What are Deberah Pelton contractions? Braxton Hicks contractions are tightening movements that occur in the muscles of the uterus before labor. Unlike true labor contractions, these contractions do not result in opening (dilation) and thinning of the cervix. Toward the end of pregnancy (32-34 weeks), Braxton Hicks contractions can happen more often and may become stronger. These contractions are sometimes difficult to tell apart from true labor because they can be very uncomfortable. You should not feel embarrassed if you go to the hospital with false labor. Sometimes, the only way to tell if you are in true labor is for your health care provider to look for changes in the cervix. The health care provider will do a physical exam and may monitor your contractions. If you are not in true labor, the exam should show that your cervix is not dilating and your water has not broken. If there are no other health problems associated with your pregnancy, it is completely safe for you to be sent home with false labor. You may continue to have Braxton Hicks contractions until you go into true labor. How to tell the difference between  true labor and false labor True labor  Contractions last 30-70 seconds.  Contractions become very regular.  Discomfort is usually felt in the top of the uterus, and it spreads to the lower abdomen and low back.  Contractions do not go away with walking.  Contractions usually become more intense and increase in frequency.  The cervix dilates and gets thinner. False labor  Contractions are usually shorter and not as strong as true labor contractions.  Contractions are usually irregular.  Contractions are often felt in the front of the lower abdomen and in the groin.  Contractions may go away when you walk around or change positions while lying down.  Contractions get weaker and are shorter-lasting as time goes  on.  The cervix usually does not dilate or become thin. Follow these instructions at home:   Take over-the-counter and prescription medicines only as told by your health care provider.  Keep up with your usual exercises and follow other instructions from your health care provider.  Eat and drink lightly if you think you are going into labor.  If Braxton Hicks contractions are making you uncomfortable: ? Change your position from lying down or resting to walking, or change from walking to resting. ? Sit and rest in a tub of warm water. ? Drink enough fluid to keep your urine pale yellow. Dehydration may cause these contractions. ? Do slow and deep breathing several times an hour.  Keep all follow-up prenatal visits as told by your health care provider. This is important. Contact a health care provider if:  You have a fever.  You have continuous pain in your abdomen. Get help right away if:  Your contractions become stronger, more regular, and closer together.  You have fluid leaking or gushing from your vagina.  You pass blood-tinged mucus (bloody show).  You have bleeding from your vagina.  You have low back pain that you never had before.  You feel  your baby's head pushing down and causing pelvic pressure.  Your baby is not moving inside you as much as it used to. Summary  Contractions that occur before labor are called Braxton Hicks contractions, false labor, or practice contractions.  Braxton Hicks contractions are usually shorter, weaker, farther apart, and less regular than true labor contractions. True labor contractions usually become progressively stronger and regular, and they become more frequent.  Manage discomfort from [facility] contractions by changing position, resting in a warm bath, drinking plenty of water, or practicing deep breathing. This information is not intended to replace advice given to you by your health care provider. Make sure you discuss any questions you have with your health care provider. Document Revised: 10/15/2017 Document Reviewed: 03/18/2017 Elsevier Patient Education  [place].

## 2020-02-15 NOTE — Progress Notes (Signed)
ROB-Noticed increased vaginal discharge and BHCs over the last few days. Home from work at this time. Discussed self care measures. Anticipatory guidance regarding course of prenatal care. Reviewed red flag symptoms and when to call. RTC x 2 weeks for ROB or sooner if needed.
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
## 2020-02-20 ENCOUNTER — Ambulatory Visit: Payer: BC Managed Care – PPO | Admitting: Cardiovascular Disease

## 2020-02-28 ENCOUNTER — Encounter: Payer: Self-pay | Admitting: Certified Nurse Midwife

## 2020-02-28 ENCOUNTER — Other Ambulatory Visit: Payer: Self-pay

## 2020-02-28 ENCOUNTER — Ambulatory Visit (INDEPENDENT_AMBULATORY_CARE_PROVIDER_SITE_OTHER): Payer: BC Managed Care – PPO | Admitting: Certified Nurse Midwife

## 2020-02-28 VITALS — BP 136/66 | HR 97 | Wt 170.0 lb

## 2020-02-28 DIAGNOSIS — Z3493 Encounter for supervision of normal pregnancy, unspecified, third trimester: Secondary | ICD-10-CM

## 2020-02-28 NOTE — Progress Notes (Signed)
ROB doing well. Feels good movement. Discussed RSB , see pregnancy check list for topics reviewed. Discussed GBS and cultures next visit . She verbalizes and agrees to plan. Follow up 1 wk with Marcelino Duster.   [name], CNM

## 2020-02-28 NOTE — Patient Instructions (Signed)
Group B Streptococcus Infection During Pregnancy °Group B Streptococcus (GBS) is a type of bacteria that is often found in healthy people. It is commonly found in the rectum, vagina, and intestines. In people who are healthy and not pregnant, the bacteria rarely cause serious illness or complications. However, women who test positive for GBS during pregnancy can pass the bacteria to the baby during childbirth. This can cause serious infection in the baby after birth. °Women with GBS may also have infections during their pregnancy or soon after childbirth. The infections include urinary tract infections (UTIs) or infections of the uterus. GBS also increases a woman's risk of complications during pregnancy, such as early labor or delivery, miscarriage, or stillbirth. Routine testing for GBS is recommended for all pregnant women. °What are the causes? °This condition is caused by bacteria called Streptococcus agalactiae. °What increases the risk? °You may have a higher risk for GBS infection during pregnancy if you had one during a past pregnancy. °What are the signs or symptoms? °In most cases, GBS infection does not cause symptoms in pregnant women. If symptoms exist, they may include: °· Labor that starts before the 37th week of pregnancy. °· A UTI or bladder infection. This may cause a fever, frequent urination, or pain and burning during urination. °· Fever during labor. There can also be a rapid heartbeat in the mother or baby. °Rare but serious symptoms of a GBS infection in women include: °· Blood infection (septicemia). This may cause fever, chills, or confusion. °· Lung infection (pneumonia). This may cause fever, chills, cough, rapid breathing, chest pain, or difficulty breathing. °· Bone, joint, skin, or soft tissue infection. °How is this diagnosed? °You may be screened for GBS between week 35 and week 37 of pregnancy. If you have symptoms of preterm labor, you may be screened earlier. This condition is  diagnosed based on lab test results from: °· A swab of fluid from the vagina and rectum. °· A urine sample. °How is this treated? °This condition is treated with antibiotic medicine. Antibiotic medicine may be given: °· To you when you go into labor, or as soon as your water breaks. The medicines will continue until after you give birth. If you are having a cesarean delivery, you do not need antibiotics unless your water has broken. °· To your baby, if he or she requires treatment. Your health care provider will check your baby to decide if he or she needs antibiotics to prevent a serious infection. °Follow these instructions at home: °· Take over-the-counter and prescription medicines only as told by your health care provider. °· Take your antibiotic medicine as told by your health care provider. Do not stop taking the antibiotic even if you start to feel better. °· Keep all pre-birth (prenatal) visits and follow-up visits as told by your health care provider. This is important. °Contact a health care provider if: °· You have pain or burning when you urinate. °· You have to urinate more often than usual. °· You have a fever or chills. °· You develop a bad-smelling vaginal discharge. °Get help right away if: °· Your water breaks. °· You go into labor. °· You have severe pain in your abdomen. °· You have difficulty breathing. °· You have chest pain. °These symptoms may represent a serious problem that is an emergency. Do not wait to see if the symptoms will go away. Get medical help right away. Call your local emergency services (911 in the [place] Do not drive yourself to   the hospital. °Summary °· GBS is a type of bacteria that is common in healthy people. °· During pregnancy, colonization with GBS can cause serious complications for you or your baby. °· Your health care provider will screen you between 35 and 37 weeks of pregnancy to determine if you are colonized with GBS. °· If you are colonized with GBS during  pregnancy, your health care provider will recommend antibiotics through an IV during labor. °· After delivery, your baby will be evaluated for complications related to potential GBS infection and may require antibiotics to prevent a serious infection. °This information is not intended to replace advice given to you by your health care provider. Make sure you discuss any questions you have with your health care provider. °Document Revised: 05/29/2019 Document Reviewed: 05/29/2019 °Elsevier Patient Education © 2020 Elsevier Inc. ° °

## 2020-02-29 LAB — POCT URINALYSIS DIPSTICK OB
Bilirubin, UA: NEGATIVE
Blood, UA: NEGATIVE
Glucose, UA: NEGATIVE
Ketones, UA: NEGATIVE
Leukocytes, UA: NEGATIVE
Nitrite, UA: NEGATIVE
POC,PROTEIN,UA: NEGATIVE
Spec Grav, UA: 1.015 (ref 1.010–1.025)
Urobilinogen, UA: 0.2 E.U./dL
pH, UA: 5 (ref 5.0–8.0)

## 2020-03-04 ENCOUNTER — Ambulatory Visit (INDEPENDENT_AMBULATORY_CARE_PROVIDER_SITE_OTHER): Payer: BC Managed Care – PPO | Admitting: Certified Nurse Midwife

## 2020-03-04 ENCOUNTER — Other Ambulatory Visit (HOSPITAL_COMMUNITY)
Admission: RE | Admit: 2020-03-04 | Discharge: 2020-03-04 | Disposition: A | Discharge status: Home / Self Care | Payer: BC Managed Care – PPO | Source: Ambulatory Visit | Attending: Certified Nurse Midwife | Admitting: Certified Nurse Midwife

## 2020-03-04 ENCOUNTER — Encounter: Payer: Self-pay | Admitting: Certified Nurse Midwife

## 2020-03-04 ENCOUNTER — Other Ambulatory Visit: Payer: Self-pay

## 2020-03-04 VITALS — BP 127/68 | HR 102 | Wt 172.2 lb

## 2020-03-04 DIAGNOSIS — O26893 Other specified pregnancy related conditions, third trimester: Secondary | ICD-10-CM | POA: Insufficient documentation

## 2020-03-04 DIAGNOSIS — Z3A36 36 weeks gestation of pregnancy: Secondary | ICD-10-CM | POA: Insufficient documentation

## 2020-03-04 DIAGNOSIS — N898 Other specified noninflammatory disorders of vagina: Secondary | ICD-10-CM | POA: Diagnosis present

## 2020-03-04 LAB — POCT URINALYSIS DIPSTICK OB
Bilirubin, UA: NEGATIVE
Blood, UA: NEGATIVE
Glucose, UA: NEGATIVE
Ketones, UA: NEGATIVE
Leukocytes, UA: NEGATIVE
Nitrite, UA: NEGATIVE
POC,PROTEIN,UA: NEGATIVE
Spec Grav, UA: 1.01 (ref 1.010–1.025)
Urobilinogen, UA: 0.2 E.U./dL
pH, UA: 5 (ref 5.0–8.0)

## 2020-03-04 LAB — OB RESULTS CONSOLE GC/CHLAMYDIA: Gonorrhea: NEGATIVE

## 2020-03-04 NOTE — Progress Notes (Signed)
Pt presents today for workin problem visit . She complains of leaking of fluid. Sterile spec exam, nitrazine negative, no fluid seen. Swabs collected, GBS collected. SVE 1 thick -2, membranes felt on exam, no fluid with cough. Reassurance given. Pt follow up 1 wk with Marcelino Duster.   [name], CNM

## 2020-03-04 NOTE — Patient Instructions (Signed)
Braxton Hicks Contractions °Contractions of the uterus can occur throughout pregnancy, but they are not always a sign that you are in labor. You may have practice contractions called Braxton Hicks contractions. These false labor contractions are sometimes confused with true labor. °What are Braxton Hicks contractions? °Braxton Hicks contractions are tightening movements that occur in the muscles of the uterus before labor. Unlike true labor contractions, these contractions do not result in opening (dilation) and thinning of the cervix. Toward the end of pregnancy (32-34 weeks), Braxton Hicks contractions can happen more often and may become stronger. These contractions are sometimes difficult to tell apart from true labor because they can be very uncomfortable. You should not feel embarrassed if you go to the hospital with false labor. °Sometimes, the only way to tell if you are in true labor is for your health care provider to look for changes in the cervix. The health care provider will do a physical exam and may monitor your contractions. If you are not in true labor, the exam should show that your cervix is not dilating and your water has not broken. °If there are no other health problems associated with your pregnancy, it is completely safe for you to be sent home with false labor. You may continue to have Braxton Hicks contractions until you go into true labor. °How to tell the difference between true labor and false labor °True labor °· Contractions last 30-70 seconds. °· Contractions become very regular. °· Discomfort is usually felt in the top of the uterus, and it spreads to the lower abdomen and low back. °· Contractions do not go away with walking. °· Contractions usually become more intense and increase in frequency. °· The cervix dilates and gets thinner. °False labor °· Contractions are usually shorter and not as strong as true labor contractions. °· Contractions are usually irregular. °· Contractions  are often felt in the front of the lower abdomen and in the groin. °· Contractions may go away when you walk around or change positions while lying down. °· Contractions get weaker and are shorter-lasting as time goes on. °· The cervix usually does not dilate or become thin. °Follow these instructions at home: ° °· Take over-the-counter and prescription medicines only as told by your health care provider. °· Keep up with your usual exercises and follow other instructions from your health care provider. °· Eat and drink lightly if you think you are going into labor. °· If Braxton Hicks contractions are making you uncomfortable: °? Change your position from lying down or resting to walking, or change from walking to resting. °? Sit and rest in a tub of warm water. °? Drink enough fluid to keep your urine pale yellow. Dehydration may cause these contractions. °? Do slow and deep breathing several times an hour. °· Keep all follow-up prenatal visits as told by your health care provider. This is important. °Contact a health care provider if: °· You have a fever. °· You have continuous pain in your abdomen. °Get help right away if: °· Your contractions become stronger, more regular, and closer together. °· You have fluid leaking or gushing from your vagina. °· You pass blood-tinged mucus (bloody show). °· You have bleeding from your vagina. °· You have low back pain that you never had before. °· You feel your baby’s head pushing down and causing pelvic pressure. °· Your baby is not moving inside you as much as it used to. °Summary °· Contractions that occur before labor are   called Braxton Hicks contractions, false labor, or practice contractions. °· Braxton Hicks contractions are usually shorter, weaker, farther apart, and less regular than true labor contractions. True labor contractions usually become progressively stronger and regular, and they become more frequent. °· Manage discomfort from Braxton Hicks contractions  by changing position, resting in a warm bath, drinking plenty of water, or practicing deep breathing. °This information is not intended to replace advice given to you by your health care provider. Make sure you discuss any questions you have with your health care provider. °Document Revised: 10/15/2017 Document Reviewed: 03/18/2017 °Elsevier Patient Education © 2020 [employer] ° °

## 2020-03-06 LAB — CERVICOVAGINAL ANCILLARY ONLY
Bacterial Vaginitis (gardnerella): NEGATIVE
Candida Glabrata: NEGATIVE
Candida Vaginitis: NEGATIVE
Chlamydia: NEGATIVE
Comment: NEGATIVE
Comment: NEGATIVE
Comment: NEGATIVE
Comment: NEGATIVE
Comment: NEGATIVE
Comment: NORMAL
Neisseria Gonorrhea: NEGATIVE
Trichomonas: NEGATIVE

## 2020-03-06 LAB — STREP GP B NAA: Strep Gp B NAA: POSITIVE — AB

## 2020-03-08 ENCOUNTER — Encounter: Payer: Self-pay | Admitting: Certified Nurse Midwife

## 2020-03-08 ENCOUNTER — Encounter: Payer: BC Managed Care – PPO | Admitting: Certified Nurse Midwife

## 2020-03-11 ENCOUNTER — Other Ambulatory Visit: Payer: Self-pay

## 2020-03-11 ENCOUNTER — Ambulatory Visit (INDEPENDENT_AMBULATORY_CARE_PROVIDER_SITE_OTHER): Payer: BC Managed Care – PPO | Admitting: Certified Nurse Midwife

## 2020-03-11 VITALS — BP 108/63 | HR 97 | Wt 174.0 lb

## 2020-03-11 DIAGNOSIS — B951 Streptococcus, group B, as the cause of diseases classified elsewhere: Secondary | ICD-10-CM

## 2020-03-11 DIAGNOSIS — Z3493 Encounter for supervision of normal pregnancy, unspecified, third trimester: Secondary | ICD-10-CM

## 2020-03-11 DIAGNOSIS — O09299 Supervision of pregnancy with other poor reproductive or obstetric history, unspecified trimester: Secondary | ICD-10-CM

## 2020-03-11 DIAGNOSIS — O99119 Other diseases of the blood and blood-forming organs and certain disorders involving the immune mechanism complicating pregnancy, unspecified trimester: Secondary | ICD-10-CM

## 2020-03-11 DIAGNOSIS — D696 Thrombocytopenia, unspecified: Secondary | ICD-10-CM

## 2020-03-11 DIAGNOSIS — Z3A37 37 weeks gestation of pregnancy: Secondary | ICD-10-CM

## 2020-03-11 LAB — POCT URINALYSIS DIPSTICK OB
Bilirubin, UA: NEGATIVE
Blood, UA: NEGATIVE
Glucose, UA: NEGATIVE
Ketones, UA: NEGATIVE
Leukocytes, UA: NEGATIVE
Nitrite, UA: NEGATIVE
Spec Grav, UA: 1.01 (ref 1.010–1.025)
Urobilinogen, UA: 0.2 E.U./dL
pH, UA: 7 (ref 5.0–8.0)

## 2020-03-11 NOTE — Patient Instructions (Signed)
Fetal Movement Counts Patient Name: ________________________________________________ Patient Due Date: ____________________ What is a fetal movement count?  A fetal movement count is the number of times that you feel your baby move during a certain amount of time. This may also be called a fetal kick count. A fetal movement count is recommended for every pregnant woman. You may be asked to start counting fetal movements as early as week 28 of your pregnancy. Pay attention to when your baby is most active. You may notice your baby's sleep and wake cycles. You may also notice things that make your baby move more. You should do a fetal movement count:  When your baby is normally most active.  At the same time each day. A good time to count movements is while you are resting, after having something to eat and drink. How do I count fetal movements? 1. Find a quiet, comfortable area. Sit, or lie down on your side. 2. Write down the date, the start time and stop time, and the number of movements that you felt between those two times. Take this information with you to your health care visits. 3. Write down your start time when you feel the first movement. 4. Count kicks, flutters, swishes, rolls, and jabs. You should feel at least 10 movements. 5. You may stop counting after you have felt 10 movements, or if you have been counting for 2 hours. Write down the stop time. 6. If you do not feel 10 movements in 2 hours, contact your health care provider for further instructions. Your health care provider may want to do additional tests to assess your baby's well-being. Contact a health care provider if:  You feel fewer than 10 movements in 2 hours.  Your baby is not moving like he or she usually does. Date: ____________ Start time: ____________ Stop time: ____________ Movements: ____________ Date: ____________ Start time: ____________ Stop time: ____________ Movements: ____________ Date: ____________  Start time: ____________ Stop time: ____________ Movements: ____________ Date: ____________ Start time: ____________ Stop time: ____________ Movements: ____________ Date: ____________ Start time: ____________ Stop time: ____________ Movements: ____________ Date: ____________ Start time: ____________ Stop time: ____________ Movements: ____________ Date: ____________ Start time: ____________ Stop time: ____________ Movements: ____________ Date: ____________ Start time: ____________ Stop time: ____________ Movements: ____________ Date: ____________ Start time: ____________ Stop time: ____________ Movements: ____________ This information is not intended to replace advice given to you by your health care provider. Make sure you discuss any questions you have with your health care provider. Document Revised: 06/22/2019 Document Reviewed: 06/22/2019 Elsevier Patient Education  2020 [employer] Inc[employer]  

## 2020-03-11 NOTE — Progress Notes (Signed)
Patient comes in today for ROB visit. She is leaking fluid and has had a bloody this past weekend.

## 2020-03-11 NOTE — Progress Notes (Signed)
ROB- She continues to report leaking of fluid. Over the weekend she reports that she lost her mucous plug and since then has continue to have leaking of clear fluid. She reports that her underwear stay wet for most of the day. She reports fetal movement but states it is decreased from previous. She is concerned about this because with her previous pregnancy she had to be induced for low fluid. She reports isolated fever over the weekend of T-max 100.3 that only lasted for one day. Nitrizine checked today and is negative. Cervical check by CNM and is unchanged.  She would like an ultrasound to check fluid to ease her mind. Provided patient with labor checklist and birth affirmations. Re-enforced education for herbal prep list and she has purchased the labor tea and plans to start this. CBC was drawn today to re-check platelets. Anticipatory guidance given. Red flags reviewed and when to call the office. RTC on Thursday for ultrasound and then 1 week for ROB or sooner if needed.   Alexandra Joseph [facility] [employer]

## 2020-03-12 LAB — CBC
Hematocrit: 32.4 % — ABNORMAL LOW (ref 34.0–46.6)
Hemoglobin: 10.8 g/dL — ABNORMAL LOW (ref 11.1–15.9)
MCH: 29.1 pg (ref 26.6–33.0)
MCHC: 33.3 g/dL (ref 31.5–35.7)
MCV: 87 fL (ref 79–97)
Platelets: 96 10*3/uL — CL (ref 150–450)
RBC: 3.71 x10E6/uL — ABNORMAL LOW (ref 3.77–5.28)
RDW: 15.4 % (ref 11.7–15.4)
WBC: 10.6 10*3/uL (ref 3.4–10.8)

## 2020-03-13 ENCOUNTER — Other Ambulatory Visit: Payer: Self-pay

## 2020-03-13 ENCOUNTER — Ambulatory Visit (INDEPENDENT_AMBULATORY_CARE_PROVIDER_SITE_OTHER): Payer: BC Managed Care – PPO

## 2020-03-13 DIAGNOSIS — O09299 Supervision of pregnancy with other poor reproductive or obstetric history, unspecified trimester: Secondary | ICD-10-CM | POA: Diagnosis not present

## 2020-03-13 DIAGNOSIS — Z3A37 37 weeks gestation of pregnancy: Secondary | ICD-10-CM

## 2020-03-13 DIAGNOSIS — Z3493 Encounter for supervision of normal pregnancy, unspecified, third trimester: Secondary | ICD-10-CM

## 2020-03-19 ENCOUNTER — Encounter: Payer: Self-pay | Admitting: Certified Nurse Midwife

## 2020-03-19 ENCOUNTER — Other Ambulatory Visit: Payer: Self-pay

## 2020-03-19 ENCOUNTER — Ambulatory Visit (INDEPENDENT_AMBULATORY_CARE_PROVIDER_SITE_OTHER): Payer: BC Managed Care – PPO | Admitting: Certified Nurse Midwife

## 2020-03-19 VITALS — BP 114/65 | HR 88 | Wt 174.1 lb

## 2020-03-19 DIAGNOSIS — Z3A38 38 weeks gestation of pregnancy: Secondary | ICD-10-CM

## 2020-03-19 DIAGNOSIS — O99119 Other diseases of the blood and blood-forming organs and certain disorders involving the immune mechanism complicating pregnancy, unspecified trimester: Secondary | ICD-10-CM

## 2020-03-19 DIAGNOSIS — D696 Thrombocytopenia, unspecified: Secondary | ICD-10-CM

## 2020-03-19 LAB — POCT URINALYSIS DIPSTICK OB
Bilirubin, UA: NEGATIVE
Blood, UA: NEGATIVE
Glucose, UA: NEGATIVE
Ketones, UA: NEGATIVE
Leukocytes, UA: NEGATIVE
Nitrite, UA: NEGATIVE
POC,PROTEIN,UA: NEGATIVE
Spec Grav, UA: 1.015 (ref 1.010–1.025)
Urobilinogen, UA: 0.2 E.U./dL
pH, UA: 6.5 (ref 5.0–8.0)

## 2020-03-19 NOTE — Patient Instructions (Signed)
Braxton Hicks Contractions °Contractions of the uterus can occur throughout pregnancy, but they are not always a sign that you are in labor. You may have practice contractions called Braxton Hicks contractions. These false labor contractions are sometimes confused with true labor. °What are Braxton Hicks contractions? °Braxton Hicks contractions are tightening movements that occur in the muscles of the uterus before labor. Unlike true labor contractions, these contractions do not result in opening (dilation) and thinning of the cervix. Toward the end of pregnancy (32-34 weeks), Braxton Hicks contractions can happen more often and may become stronger. These contractions are sometimes difficult to tell apart from true labor because they can be very uncomfortable. You should not feel embarrassed if you go to the hospital with false labor. °Sometimes, the only way to tell if you are in true labor is for your health care provider to look for changes in the cervix. The health care provider will do a physical exam and may monitor your contractions. If you are not in true labor, the exam should show that your cervix is not dilating and your water has not broken. °If there are no other health problems associated with your pregnancy, it is completely safe for you to be sent home with false labor. You may continue to have Braxton Hicks contractions until you go into true labor. °How to tell the difference between true labor and false labor °True labor °· Contractions last 30-70 seconds. °· Contractions become very regular. °· Discomfort is usually felt in the top of the uterus, and it spreads to the lower abdomen and low back. °· Contractions do not go away with walking. °· Contractions usually become more intense and increase in frequency. °· The cervix dilates and gets thinner. °False labor °· Contractions are usually shorter and not as strong as true labor contractions. °· Contractions are usually irregular. °· Contractions  are often felt in the front of the lower abdomen and in the groin. °· Contractions may go away when you walk around or change positions while lying down. °· Contractions get weaker and are shorter-lasting as time goes on. °· The cervix usually does not dilate or become thin. °Follow these instructions at home: ° °· Take over-the-counter and prescription medicines only as told by your health care provider. °· Keep up with your usual exercises and follow other instructions from your health care provider. °· Eat and drink lightly if you think you are going into labor. °· If Braxton Hicks contractions are making you uncomfortable: °? Change your position from lying down or resting to walking, or change from walking to resting. °? Sit and rest in a tub of warm water. °? Drink enough fluid to keep your urine pale yellow. Dehydration may cause these contractions. °? Do slow and deep breathing several times an hour. °· Keep all follow-up prenatal visits as told by your health care provider. This is important. °Contact a health care provider if: °· You have a fever. °· You have continuous pain in your abdomen. °Get help right away if: °· Your contractions become stronger, more regular, and closer together. °· You have fluid leaking or gushing from your vagina. °· You pass blood-tinged mucus (bloody show). °· You have bleeding from your vagina. °· You have low back pain that you never had before. °· You feel your baby’s head pushing down and causing pelvic pressure. °· Your baby is not moving inside you as much as it used to. °Summary °· Contractions that occur before labor are   called Braxton Hicks contractions, false labor, or practice contractions. °· Braxton Hicks contractions are usually shorter, weaker, farther apart, and less regular than true labor contractions. True labor contractions usually become progressively stronger and regular, and they become more frequent. °· Manage discomfort from Braxton Hicks contractions  by changing position, resting in a warm bath, drinking plenty of water, or practicing deep breathing. °This information is not intended to replace advice given to you by your health care provider. Make sure you discuss any questions you have with your health care provider. °Document Revised: 10/15/2017 Document Reviewed: 03/18/2017 °Elsevier Patient Education © 2020 [employer] ° °

## 2020-03-19 NOTE — Progress Notes (Signed)
ROB doing well. Feels movement. SVE per pt request 1.5/60/-2 . Discussed repeat cbc today to evaluate platelets. Discussed possibility of no epidural due to low platelets. Will do urgent referral to hem if plt remain low. Pt verbalizes and agrees to plan. Reviewed u/s results from last week. Follow up 1 wk.   [name], CNM

## 2020-03-20 ENCOUNTER — Other Ambulatory Visit: Payer: Self-pay | Admitting: Certified Nurse Midwife

## 2020-03-20 DIAGNOSIS — O99119 Other diseases of the blood and blood-forming organs and certain disorders involving the immune mechanism complicating pregnancy, unspecified trimester: Secondary | ICD-10-CM

## 2020-03-20 DIAGNOSIS — D696 Thrombocytopenia, unspecified: Secondary | ICD-10-CM

## 2020-03-20 LAB — CBC
Hematocrit: 33 % — ABNORMAL LOW (ref 34.0–46.6)
Hemoglobin: 10.8 g/dL — ABNORMAL LOW (ref 11.1–15.9)
MCH: 28.1 pg (ref 26.6–33.0)
MCHC: 32.7 g/dL (ref 31.5–35.7)
MCV: 86 fL (ref 79–97)
Platelets: 95 10*3/uL — CL (ref 150–450)
RBC: 3.84 x10E6/uL (ref 3.77–5.28)
RDW: 15.4 % (ref 11.7–15.4)
WBC: 8.8 10*3/uL (ref 3.4–10.8)

## 2020-03-20 NOTE — Addendum Note (Signed)
Addended by: Mechele Claude on: 03/20/2020 03:31 PM   Modules accepted: Orders

## 2020-03-20 NOTE — Progress Notes (Signed)
Urgent referral placed to hematology for thrombocytopenia. Pt notified via my chart.   [name], CNM

## 2020-03-21 ENCOUNTER — Other Ambulatory Visit: Payer: Self-pay

## 2020-03-22 ENCOUNTER — Encounter: Payer: Self-pay | Admitting: Internal Medicine

## 2020-03-22 ENCOUNTER — Inpatient Hospital Stay: Payer: BC Managed Care – PPO | Attending: Internal Medicine | Admitting: Internal Medicine

## 2020-03-22 ENCOUNTER — Telehealth: Payer: Self-pay | Admitting: Certified Nurse Midwife

## 2020-03-22 ENCOUNTER — Inpatient Hospital Stay: Payer: BC Managed Care – PPO

## 2020-03-22 ENCOUNTER — Telehealth: Payer: Self-pay | Admitting: Internal Medicine

## 2020-03-22 DIAGNOSIS — O99113 Other diseases of the blood and blood-forming organs and certain disorders involving the immune mechanism complicating pregnancy, third trimester: Secondary | ICD-10-CM | POA: Diagnosis present

## 2020-03-22 DIAGNOSIS — Z3493 Encounter for supervision of normal pregnancy, unspecified, third trimester: Secondary | ICD-10-CM

## 2020-03-22 DIAGNOSIS — O99119 Other diseases of the blood and blood-forming organs and certain disorders involving the immune mechanism complicating pregnancy, unspecified trimester: Secondary | ICD-10-CM

## 2020-03-22 DIAGNOSIS — D649 Anemia, unspecified: Secondary | ICD-10-CM | POA: Diagnosis not present

## 2020-03-22 DIAGNOSIS — D696 Thrombocytopenia, unspecified: Secondary | ICD-10-CM

## 2020-03-22 LAB — FERRITIN: Ferritin: 6 ng/mL — ABNORMAL LOW (ref 11–307)

## 2020-03-22 LAB — CBC WITH DIFFERENTIAL/PLATELET
Abs Immature Granulocytes: 0.3 10*3/uL — ABNORMAL HIGH (ref 0.00–0.07)
Basophils Absolute: 0 10*3/uL (ref 0.0–0.1)
Basophils Relative: 0 %
Eosinophils Absolute: 0.1 10*3/uL (ref 0.0–0.5)
Eosinophils Relative: 1 %
HCT: 31.6 % — ABNORMAL LOW (ref 36.0–46.0)
Hemoglobin: 10.7 g/dL — ABNORMAL LOW (ref 12.0–15.0)
Immature Granulocytes: 3 %
Lymphocytes Relative: 17 %
Lymphs Abs: 1.7 10*3/uL (ref 0.7–4.0)
MCH: 29.1 pg (ref 26.0–34.0)
MCHC: 33.9 g/dL (ref 30.0–36.0)
MCV: 85.9 fL (ref 80.0–100.0)
Monocytes Absolute: 0.8 10*3/uL (ref 0.1–1.0)
Monocytes Relative: 8 %
Neutro Abs: 7 10*3/uL (ref 1.7–7.7)
Neutrophils Relative %: 71 %
Platelets: 91 10*3/uL — ABNORMAL LOW (ref 150–400)
RBC: 3.68 MIL/uL — ABNORMAL LOW (ref 3.87–5.11)
RDW: 14.7 % (ref 11.5–15.5)
WBC: 9.8 10*3/uL (ref 4.0–10.5)
nRBC: 0 % (ref 0.0–0.2)

## 2020-03-22 LAB — RETICULOCYTES
Immature Retic Fract: 17.3 % — ABNORMAL HIGH (ref 2.3–15.9)
RBC.: 3.71 MIL/uL — ABNORMAL LOW (ref 3.87–5.11)
Retic Count, Absolute: 73.1 10*3/uL (ref 19.0–186.0)
Retic Ct Pct: 2 % (ref 0.4–3.1)

## 2020-03-22 LAB — COMPREHENSIVE METABOLIC PANEL
ALT: 14 U/L (ref 0–44)
AST: 18 U/L (ref 15–41)
Albumin: 2.9 g/dL — ABNORMAL LOW (ref 3.5–5.0)
Alkaline Phosphatase: 109 U/L (ref 38–126)
Anion gap: 10 (ref 5–15)
BUN: <5 mg/dL — ABNORMAL LOW (ref 6–20)
CO2: 22 mmol/L (ref 22–32)
Calcium: 8.3 mg/dL — ABNORMAL LOW (ref 8.9–10.3)
Chloride: 105 mmol/L (ref 98–111)
Creatinine, Ser: 0.42 mg/dL — ABNORMAL LOW (ref 0.44–1.00)
GFR calc Af Amer: >60 mL/min (ref >60)
GFR calc non Af Amer: >60 mL/min (ref >60)
Glucose, Bld: 84 mg/dL (ref 70–99)
Potassium: 3.4 mmol/L — ABNORMAL LOW (ref 3.5–5.1)
Sodium: 137 mmol/L (ref 135–145)
Total Bilirubin: 0.7 mg/dL (ref 0.3–1.2)
Total Protein: 6.1 g/dL — ABNORMAL LOW (ref 6.5–8.1)

## 2020-03-22 LAB — TECHNOLOGIST SMEAR REVIEW
Plt Morphology: NORMAL
RBC Morphology: NORMAL

## 2020-03-22 LAB — LACTATE DEHYDROGENASE: LDH: 129 U/L (ref 98–192)

## 2020-03-22 LAB — APTT: aPTT: 25 seconds (ref 24–36)

## 2020-03-22 LAB — PROTIME-INR
INR: 1 (ref 0.8–1.2)
Prothrombin Time: 12.8 seconds (ref 11.4–15.2)

## 2020-03-22 MED ORDER — DEXAMETHASONE 4 MG PO TABS: ORAL_TABLET | ORAL | 0 refills | Status: DC

## 2020-03-22 NOTE — Assessment & Plan Note (Addendum)
#  Thrombocytopenia-most recent 90s; mild anemia ~10.  The etiology is unclear-the differential diagnosis includes-gestational thrombocytopenia; ITP.  Clinically less clinically-acute causes like HELLP syndrome; liver disease; and other systemic causes.  #Anemia mild-dilutional/iron deficiency-currently on p.o. iron.   #Pregnancy/delivery issues-discussed platelets above 50,000 should not contraindicate vaginal delivery/C-section.  With regards to epidural recommend platelets above 80,000.  Discussed with the patient regarding the use of steroids-to help treat the platelet counts if still trending low.   #History of nosebleeds/gum bleeding-unlikely related to slight low platelets.  Check PT PTT.  #Recommend CBC CMP LDH; haptoglobin peripheral smear; reticulocyte count iron studies ferritin; PT PTT; review of peripheral smear.   Thank you, Ms. [name], CNM  for allowing me to participate in the care of your pleasant patient. Please do not hesitate to contact me with questions or concerns in the interim.  # DISPOSITION: [phone]/cell # labs today; # follow up TBD- dr.B  Addendum: Patient's platelets are 91 hemoglobin 10.5.  Rest of the lab work pending.  Left a message for patient's referring provider to discuss.

## 2020-03-22 NOTE — Progress Notes (Signed)
[place] CONSULT NOTE  Patient Care Team: Derinda Late, MD as PCP - General (Family Medicine)  CHIEF COMPLAINTS/PURPOSE OF CONSULTATION: Thrombocytopenia   HEMATOLOGY HISTORY  # THROMBOCYTOPENIA- MAY 2021-second pregnancy [platelets-95]; Hb-10.5 [Third trimester]   HISTORY OF PRESENTING ILLNESS:  Alexandra Joseph 24 y.o.  female pleasant patient who is currently [redacted] weeks pregnant was been referred to [place] for further evaluation of thrombocytopenia.   [facility].  Of note patient noted to have platelets around 100,000 around the time of patient's prior/first pregnancy in September 2018.  Patient did get epidural at the time.  As per patient she "hemorrhaged: after her normal vaginal delivery.  However no other complications noted.  Baby did not have any complications.  Also her platelets improved post delivery.  Patient is currently [redacted] weeks pregnant; expected date of delivery is May 17.  Patient has history of nosebleeds even when young needing cauterization.  However she noted to have increased gum bleeding with brushing.  Otherwise denies any blood in stools black or stools.   Review of Systems  Constitutional: Positive for malaise/fatigue. Negative for chills, diaphoresis, fever and weight loss.  HENT: Negative for nosebleeds and sore throat.   Eyes: Negative for double vision.  Respiratory: Negative for cough, hemoptysis, sputum production, shortness of breath and wheezing.   Cardiovascular: Negative for chest pain, palpitations, orthopnea and leg swelling.  Gastrointestinal: Negative for abdominal pain, blood in stool, constipation, diarrhea, heartburn, melena, nausea and vomiting.  Genitourinary: Negative for dysuria, frequency and urgency.  Musculoskeletal: Negative for back pain and joint pain.  Skin: Negative.  Negative for itching and rash.  Neurological: Negative for dizziness, tingling, focal weakness, weakness and  headaches.  Endo/Heme/Allergies: Bruises/bleeds easily.  Psychiatric/Behavioral: Negative for depression. The patient is not nervous/anxious and does not have insomnia.      MEDICAL HISTORY:  Past Medical History:  Diagnosis Date  . Anemia   . Anxiety   . Costochondritis   . Depression   . Menorrhagia   . Migraine   . Tachycardia     SURGICAL HISTORY: Past Surgical History:  Procedure Laterality Date  . ADENOIDECTOMY    . DILATION AND EVACUATION N/A 07/23/2017   Procedure: DILATATION AND EVACUATION;  Surgeon: Harlin Heys, MD;  Location: [facility] ORS;  Service: Gynecology;  Laterality: N/A;  . TONSILLECTOMY      SOCIAL HISTORY: Social History   Socioeconomic History  . Marital status: Married    Spouse name: Erlene Quan  . Number of children: 1  . Years of education: Not on file  . Highest education level: Not on file  Occupational History  . Not on file  Tobacco Use  . Smoking status: Never Smoker  . Smokeless tobacco: Never Used  Substance and Sexual Activity  . Alcohol use: No  . Drug use: No  . Sexual activity: Yes    Birth control/protection: Pill  Other Topics Concern  . Not on file  Social History Narrative   Lives in [place]; work at hospital at [employer]; no smoking; no alcohol.    Social Determinants of Health   Financial Resource Strain:   . Difficulty of Paying Living Expenses:   Food Insecurity:   . Worried About Charity fundraiser in the Last Year:   . Arboriculturist in the Last Year:   Transportation Needs:   . Film/video editor (Medical):   Marland Kitchen Lack of Transportation (Non-Medical):   Physical Activity:   .  Days of Exercise per Week:   . Minutes of Exercise per Session:   Stress:   . Feeling of Stress :   Social Connections:   . Frequency of Communication with Friends and Family:   . Frequency of Social Gatherings with Friends and Family:   . Attends Religious Services:   . Active Member of Clubs or Organizations:   . Attends  Banker Meetings:   Marland Kitchen Marital Status:   Intimate Partner Violence:   . Fear of Current or Ex-Partner:   . Emotionally Abused:   Marland Kitchen Physically Abused:   . Sexually Abused:     FAMILY HISTORY: Family History  Problem Relation Age of Onset  . Hypertension Mother   . Arrhythmia Mother        palpitations  . Hyperlipidemia Mother   . Heart attack Maternal Uncle 42  . Heart attack Maternal Grandmother   . Heart disease Maternal Grandmother        CABG  . Stroke Maternal Grandmother   . Osteoarthritis Maternal Grandmother   . Hypertension Maternal Grandfather   . Stroke Maternal Grandfather     ALLERGIES:  has No Known Allergies.  MEDICATIONS:  Current Outpatient Medications  Medication Sig Dispense Refill  . Iron-FA-B Cmp-C-Biot-Probiotic (FUSION PLUS) CAPS Take 1 tablet by mouth daily. 30 capsule 4  . Prenatal Vit-Fe Fumarate-FA (PRENATAL MULTIVITAMIN) TABS tablet Take 1 tablet by mouth daily at 12 noon.     No current facility-administered medications for this visit.     Marland Kitchen  PHYSICAL EXAMINATION:   Vitals:   03/22/20 1119  BP: 134/85  Pulse: 98  Temp: (!) 97 F (36.1 C)   Filed Weights   03/22/20 1119  Weight: 176 lb 2 oz (79.9 kg)    Physical Exam  Constitutional: She is oriented to person, place, and time and well-developed, well-nourished, and in no distress.  HENT:  Head: Normocephalic and atraumatic.  Mouth/Throat: Oropharynx is clear and moist. No oropharyngeal exudate.  Eyes: Pupils are equal, round, and reactive to light.  Cardiovascular: Normal rate and regular rhythm.  Pulmonary/Chest: Effort normal and breath sounds normal. No respiratory distress. She has no wheezes.  Abdominal: Soft. Bowel sounds are normal. She exhibits distension. She exhibits no mass. There is no abdominal tenderness. There is no rebound and no guarding.  Gravid uterus noted  Musculoskeletal:        General: No tenderness or edema. Normal range of motion.      Cervical back: Normal range of motion and neck supple.  Neurological: She is alert and oriented to person, place, and time.  Skin: Skin is warm.  Psychiatric: Affect normal.     LABORATORY DATA:  I have reviewed the data as listed Lab Results  Component Value Date   WBC 9.8 03/22/2020   HGB 10.7 (L) 03/22/2020   HCT 31.6 (L) 03/22/2020   MCV 85.9 03/22/2020   PLT 91 (L) 03/22/2020   Recent Labs    10/18/19 1736 01/30/20 1156 03/22/20 1156  NA 135 139 137  K 3.7 4.1 3.4*  CL 106 105 105  CO2 21* 19* 22  GLUCOSE 92 70 84  BUN 8 3* <5*  CREATININE 0.46 0.46* 0.42*  CALCIUM 8.9 8.8 8.3*  GFRNONAA >60 142 >60  GFRAA >60 163 >60  PROT 6.7 5.8* 6.1*  ALBUMIN 3.8 3.6* 2.9*  AST 15 14 18   ALT 15 6 14   ALKPHOS 38 80 109  BILITOT 0.5 0.3 0.7  US OB Follow Up  Result Date: 03/19/2020 Patient Name: TEONA VARGUS DOB: 1996/09/28 MRN: [id number] ULTRASOUND REPORT Location: [facility] Date of Service: 03/13/2020 Indications:growth/afi Findings: Mason Jim intrauterine pregnancy is visualized with FHR at 152 BPM. Biometrics give an (U/S) Gestational age of [redacted]w[redacted]d and an (U/S) EDD of 03/29/2020; this correlates with the clinically established Estimated Date of Delivery: 04/01/20. Fetal presentation is Cephalic. Placenta: posterior. Grade: 2 AFI: 8.9 cm Growth percentile is 57. EFW: 3235 g ( 7 lbs 2 oz) Impression: 1. [redacted]w[redacted]d Viable [place] Intrauterine pregnancy previously established criteria. 2. Growth is 57 %ile.  AFI is 8.9 cm. Recommendations: 1.Clinical correlation with the patient's History and Physical Exam. Jenine  M. Marciano Sequin     RDMS The ultrasound images and findings were reviewed by me and I agree with the above report. Elonda Husky, M.D. 03/19/2020 11:44 AM   ASSESSMENT & PLAN:   Thrombocytopenia affecting pregnancy (HCC) #Thrombocytopenia-most recent 90s; mild anemia ~10.  The etiology is unclear-the differential diagnosis includes-gestational thrombocytopenia; ITP.   Clinically less clinically-acute causes like HELLP syndrome; liver disease; and other systemic causes.  #Anemia mild-dilutional/iron deficiency-currently on p.o. iron.   #Pregnancy/delivery issues-discussed platelets above 50,000 should not contraindicate vaginal delivery/C-section.  With regards to epidural recommend platelets above 80,000.  Discussed with the patient regarding the use of steroids-to help treat the platelet counts if still trending low.   #History of nosebleeds/gum bleeding-unlikely related to slight low platelets.  Check PT PTT.  #Recommend CBC CMP LDH; haptoglobin peripheral smear; reticulocyte count iron studies ferritin; PT PTT; review of peripheral smear.   Thank you, Ms. [name], CNM  for allowing me to participate in the care of your pleasant patient. Please do not hesitate to contact me with questions or concerns in the interim.  # DISPOSITION: [phone]/cell # labs today; # follow up TBD- dr.B  Addendum: Patient's platelets are 91 hemoglobin 10.5.  Rest of the lab work pending.  Left a message for patient's referring provider to discuss.    Earna Coder, MD 03/22/2020 12:47 PM

## 2020-03-22 NOTE — Telephone Encounter (Signed)
Spoke to pt regarding my conversation with [place], Ob.  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  Discussed the potential side effects of dexamethasone.  Patient agreement.  Sent in a prescription dexamethasone.  Schedule- on 5/14-lab appointment-CBC.   FYI-Ms.Lawhorn.

## 2020-03-22 NOTE — Telephone Encounter (Signed)
Telephone call to patient, verified full name and date of birth.   Advised that CNM spoke with Oncology/Hematology today regarding plan of care.   Induction of labor tentatively set for 03/30/2020 at midnight.   Discussed plan of care in detail.   Reviewed red flag symptoms and when to call.   RTC as previously scheduled or sooner if needed.    Gunnar Bulla, CNM [facility], [facility] 03/22/20 5:47 PM

## 2020-03-23 LAB — HAPTOGLOBIN: Haptoglobin: 109 mg/dL (ref 33–278)

## 2020-03-25 NOTE — Addendum Note (Signed)
Addended by: Keitha Butte on: 03/25/2020 08:38 AM   Modules accepted: Orders

## 2020-03-27 ENCOUNTER — Other Ambulatory Visit: Payer: Self-pay | Admitting: Certified Nurse Midwife

## 2020-03-29 ENCOUNTER — Inpatient Hospital Stay: Payer: BC Managed Care – PPO

## 2020-03-29 ENCOUNTER — Encounter: Payer: Self-pay | Admitting: Certified Nurse Midwife

## 2020-03-29 ENCOUNTER — Ambulatory Visit (INDEPENDENT_AMBULATORY_CARE_PROVIDER_SITE_OTHER): Payer: BC Managed Care – PPO | Admitting: Certified Nurse Midwife

## 2020-03-29 ENCOUNTER — Other Ambulatory Visit: Payer: Self-pay

## 2020-03-29 VITALS — BP 124/71 | HR 92 | Wt 173.3 lb

## 2020-03-29 DIAGNOSIS — D696 Thrombocytopenia, unspecified: Secondary | ICD-10-CM

## 2020-03-29 DIAGNOSIS — O99119 Other diseases of the blood and blood-forming organs and certain disorders involving the immune mechanism complicating pregnancy, unspecified trimester: Secondary | ICD-10-CM

## 2020-03-29 DIAGNOSIS — Z3493 Encounter for supervision of normal pregnancy, unspecified, third trimester: Secondary | ICD-10-CM

## 2020-03-29 DIAGNOSIS — Z3A39 39 weeks gestation of pregnancy: Secondary | ICD-10-CM

## 2020-03-29 LAB — CBC WITH DIFFERENTIAL/PLATELET
Abs Immature Granulocytes: 0.34 10*3/uL — ABNORMAL HIGH (ref 0.00–0.07)
Basophils Absolute: 0 10*3/uL (ref 0.0–0.1)
Basophils Relative: 0 %
Eosinophils Absolute: 0.1 10*3/uL (ref 0.0–0.5)
Eosinophils Relative: 1 %
HCT: 32.5 % — ABNORMAL LOW (ref 36.0–46.0)
Hemoglobin: 11.1 g/dL — ABNORMAL LOW (ref 12.0–15.0)
Immature Granulocytes: 3 %
Lymphocytes Relative: 21 %
Lymphs Abs: 2.7 10*3/uL (ref 0.7–4.0)
MCH: 28.7 pg (ref 26.0–34.0)
MCHC: 34.2 g/dL (ref 30.0–36.0)
MCV: 84 fL (ref 80.0–100.0)
Monocytes Absolute: 1.2 10*3/uL — ABNORMAL HIGH (ref 0.1–1.0)
Monocytes Relative: 9 %
Neutro Abs: 8.4 10*3/uL — ABNORMAL HIGH (ref 1.7–7.7)
Neutrophils Relative %: 66 %
Platelets: 121 10*3/uL — ABNORMAL LOW (ref 150–400)
RBC: 3.87 MIL/uL (ref 3.87–5.11)
RDW: 14.6 % (ref 11.5–15.5)
WBC: 12.8 10*3/uL — ABNORMAL HIGH (ref 4.0–10.5)
nRBC: 0 % (ref 0.0–0.2)

## 2020-03-29 LAB — POCT URINALYSIS DIPSTICK OB
Bilirubin, UA: NEGATIVE
Blood, UA: NEGATIVE
Glucose, UA: NEGATIVE
Ketones, UA: NEGATIVE
Leukocytes, UA: NEGATIVE
Nitrite, UA: NEGATIVE
POC,PROTEIN,UA: NEGATIVE
Spec Grav, UA: 1.01 (ref 1.010–1.025)
Urobilinogen, UA: 0.2 E.U./dL
pH, UA: 7 (ref 5.0–8.0)

## 2020-03-29 NOTE — Progress Notes (Signed)
ROB- Doing well. Reports lower abdominal pain, vaginal pressure and low back pain. Has visit with Hematology after this visit. Discussed plan for induction with patient. Anticipatory guidance given. Reviewed red flags and when to call the office.  RTC x 3-4 weeks for Televisit and 6-7 weeks for PPV with [name], CNM or sooner if needed.   Glorious Peach RN [facility] [employer] [facility] [employer] 03/29/20 9:41 AM

## 2020-03-29 NOTE — Progress Notes (Signed)
I have seen, interviewed, and examined the patient in conjunction with the [employer] Nurse Practitioner student and affirm the diagnosis and management plan.   Gunnar Bulla, CNM [facility], [facility] 03/29/20 9:45 AM

## 2020-03-29 NOTE — Patient Instructions (Addendum)
Augmentation of Labor  Augmentation of labor is when steps are taken to stimulate and strengthen contractions of the uterus during labor. This may be done when contractions have slowed down or stopped, delaying progress of labor and delivery of the baby. Before beginning augmentation of labor, your health care provider will evaluate your condition, your baby's condition, the size and position of your baby, and the size of your birth canal. What are some reasons for labor augmentation? Augmentation of labor may be needed when:  You are in labor but your contractions are weak or irregular.  You are in labor but your contractions have stopped. What methods are used for labor augmentation? Labor augmentation may be done by:  Giving medicine that stimulates contractions (oxytocin). This is given through an IV tube that is inserted into one of your veins.  Breaking the fluid-filled sac that surrounds the fetus (amniotic sac). What are the risks associated with labor augmentation? Some risks of labor augmentation include:  Too much stimulation of the contractions, resulting in continuous, prolonged, or very strong contractions.  Increased risk of infection for you and your baby.  Tearing (rupture) of the uterus.  Breaking off (abruption) of the placenta.  Increased risk of cesarean, forceps, or vacuum delivery.  Excessive bleeding after delivery (postpartum hemorrhage).  Death of the baby (fetal death). What are some reasons for not doing labor augmentation? Augmentation of labor should not be done if:  The baby is too big for the birth canal. This can be confirmed with an ultrasound.  The umbilical cord drops in front of the baby's head or breech part (prolapsed cord).  You have had a cesarean delivery and you had a vertical incision or you do not know what type of incision you had.  You have had surgery on or into your uterus.  You have an active herpes outbreak.  You have  cervical cancer.  The placenta blocks the opening of the cervix (placenta previa) or you have other condition that is blocking the cervix or vaginal outlet.  The baby is lying sideways.  Your pelvis is will not permit the passage of the baby.  You are carrying more than two babies. Summary  Augmentation of labor is when steps are taken to stimulate and strengthen contractions of the uterus during labor. This may be done when contractions have slowed down or stopped, delaying progress of labor and delivery of the baby.  Labor augmentation may be done using medicine to stimulate contractions (oxytocin) or by breaking the fluid-filled sac that surrounds the fetus (amniotic sac).  Labor should not be augmented if you have had a cesarean delivery and you had a vertical incision or you do not know what type of incision you had. This information is not intended to replace advice given to you by your health care provider. Make sure you discuss any questions you have with your health care provider. Document Revised: 08/29/2019 Document Reviewed: 12/07/2016 Elsevier Patient Education  2020 [place] induction is when steps are taken to cause a pregnant woman to begin the labor process. Most women go into labor on their own between 37 weeks and 42 weeks of pregnancy. When this does not happen or when there is a medical need for labor to begin, steps may be taken to induce labor. Labor induction causes a pregnant woman's uterus to contract. It also causes the cervix to soften (ripen), open (dilate), and thin out (efface). Usually, labor is not  induced before 39 weeks of pregnancy unless there is a medical reason to do so. Your health care provider will determine if labor induction is needed. Before inducing labor, your health care provider will consider a number of factors, including:  Your medical condition and your baby's.  How many weeks along you are in your  pregnancy.  How mature your baby's lungs are.  The condition of your cervix.  The position of your baby.  The size of your birth canal. What are some reasons for labor induction? Labor may be induced if:  Your health or your baby's health is at risk.  Your pregnancy is overdue by 1 week or more.  Your water breaks but labor does not start on its own.  There is a low amount of amniotic fluid around your baby. You may also choose (elect) to have labor induced at a certain time. Generally, elective labor induction is done no earlier than 39 weeks of pregnancy. What methods are used for labor induction? Methods used for labor induction include:  Prostaglandin medicine. This medicine starts contractions and causes the cervix to dilate and ripen. It can be taken by mouth (orally) or by being inserted into the vagina (suppository).  Inserting a small, thin tube (catheter) with a balloon into the vagina and then expanding the balloon with water to dilate the cervix.  Stripping the membranes. In this method, your health care provider gently separates amniotic sac tissue from the cervix. This causes the cervix to stretch, which in turn causes the release of a hormone called progesterone. The hormone causes the uterus to contract. This procedure is often done during an office visit, after which you will be sent home to wait for contractions to begin.  Breaking the water. In this method, your health care provider uses a small instrument to make a small hole in the amniotic sac. This eventually causes the amniotic sac to break. Contractions should begin after a few hours.  Medicine to trigger or strengthen contractions. This medicine is given through an IV that is inserted into a vein in your arm. Except for membrane stripping, which can be done in a clinic, labor induction is done in the hospital so that you and your baby can be carefully monitored. How long does it take for labor to be  induced? The length of time it takes to induce labor depends on how ready your body is for labor. Some inductions can take up to 2-3 days, while others may take less than a day. Induction may take longer if:  You are induced early in your pregnancy.  It is your first pregnancy.  Your cervix is not ready. What are some risks associated with labor induction? Some risks associated with labor induction include:  Changes in fetal heart rate, such as being too high, too low, or irregular (erratic).  Failed induction.  Infection in the mother or the baby.  Increased risk of having a cesarean delivery.  Fetal death.  Breaking off (abruption) of the placenta from the uterus (rare).  Rupture of the uterus (very rare). When induction is needed for medical reasons, the benefits of induction generally outweigh the risks. What are some reasons for not inducing labor? Labor induction should not be done if:  Your baby does not tolerate contractions.  You have had previous surgeries on your uterus, such as a myomectomy, removal of fibroids, or a vertical scar from a previous cesarean delivery.  Your placenta lies very low in your uterus  and blocks the opening of the cervix (placenta previa).  Your baby is not in a head-down position.  The umbilical cord drops down into the birth canal in front of the baby.  There are unusual circumstances, such as the baby being very early (premature).  You have had more than 2 previous cesarean deliveries. Summary  Labor induction is when steps are taken to cause a pregnant woman to begin the labor process.  Labor induction causes a pregnant woman's uterus to contract. It also causes the cervix to ripen, dilate, and efface.  Labor is not induced before 39 weeks of pregnancy unless there is a medical reason to do so.  When induction is needed for medical reasons, the benefits of induction generally outweigh the risks. This information is not intended  to replace advice given to you by your health care provider. Make sure you discuss any questions you have with your health care provider. Document Revised: 11/05/2017 Document Reviewed: 12/16/2016 Elsevier Patient Education  [place].   Common Medications Safe in Pregnancy  Acne:      Constipation:  Benzoyl Peroxide     Colace  Clindamycin      Dulcolax Suppository  [place]ica Erythromycin     Fibercon  Salicylic Acid      Metamucil         Miralax AVOID:        Senakot   Accutane    Cough:  Retin-A       Cough Drops  Tetracycline      Phenergan w/ Codeine if Rx  Minocycline      Robitussin (Plain & DM)  Antibiotics:     Crabs/Lice:  Ceclor       RID  Cephalosporins    AVOID:  E-Mycins      Kwell  Keflex  Macrobid/Macrodantin   Diarrhea:  Penicillin      Kao-Pectate  Zithromax      Imodium AD         PUSH FLUIDS AVOID:       Cipro     Fever:  Tetracycline      Tylenol (Regular or Extra  Minocycline       Strength)  Levaquin      Extra Strength-Do not          Exceed 8 tabs/24 hrs Caffeine:        <226m/day (equiv. To 1 cup of coffee or  approx. 3 12 oz sodas)         Gas: Cold/Hayfever:       Gas-X  Benadryl      Mylicon  Claritin       Phazyme  **Claritin-D        Chlor-Trimeton    Headaches:  Dimetapp      ASA-Free Excedrin  Drixoral-Non-Drowsy     Cold Compress  Mucinex (Guaifenasin)     Tylenol (Regular or Extra  Sudafed/Sudafed-12 Hour     Strength)  **Sudafed PE Pseudoephedrine   Tylenol Cold & Sinus     Vicks Vapor Rub  Zyrtec  **AVOID if Problems With Blood Pressure         Heartburn: Avoid lying down for at least 1 hour after meals  Aciphex      Maalox     Rash:  Milk of Magnesia     Benadryl    Mylanta       1% Hydrocortisone Cream  Pepcid  Pepcid Complete   Sleep Aids:  Prevacid  Ambien   Prilosec       Benadryl  Rolaids       Chamomile Tea  Tums (Limit 4/day)     Unisom  Zantac       Tylenol PM         Warm milk-add  vanilla or  Hemorrhoids:       Sugar for taste  Anusol/Anusol H.C.  (RX: Analapram 2.5%)  Sugar Substitutes:  Hydrocortisone OTC     Ok in moderation  Preparation H      Tucks        Vaseline lotion applied to tissue with wiping    Herpes:     Throat:  Acyclovir      Oragel  Famvir  Valtrex     Vaccines:         Flu Shot Leg Cramps:       *Gardasil  Benadryl      Hepatitis A         Hepatitis B Nasal Spray:       Pneumovax  Saline Nasal Spray     Polio Booster         Tetanus Nausea:       Tuberculosis test or PPD  Vitamin B6 25 mg TID   AVOID:    Dramamine      *Gardasil  Emetrol       Live Poliovirus  Ginger Root 250 mg QID    MMR (measles, mumps &  High Complex Carbs @ Bedtime    rebella)  Sea Bands-Accupressure    Varicella (Chickenpox)  Unisom 1/2 tab TID     *No known complications           If received before Pain:         Known pregnancy;   Darvocet       Resume series after  Lortab        Delivery  Percocet    Yeast:   Tramadol      Femstat  Tylenol 3      Gyne-lotrimin  Ultram       Monistat  Vicodin           MISC:         All Sunscreens           Hair Coloring/highlights          Insect Repellant's          (Including DEET)         Mystic Tans Breastfeeding  Choosing to breastfeed is one of the best decisions you can make for yourself and your baby. A change in hormones during pregnancy causes your breasts to make breast milk in your milk-producing glands. Hormones prevent breast milk from being released before your baby is born. They also prompt milk flow after birth. Once breastfeeding has begun, thoughts of your baby, as well as his or her sucking or crying, can stimulate the release of milk from your milk-producing glands. Benefits of breastfeeding Research shows that breastfeeding offers many health benefits for infants and mothers. It also offers a cost-free and convenient way to feed your baby. For your baby  Your first milk (colostrum) helps your  baby's digestive system to function better.  Special cells in your milk (antibodies) help your baby to fight off infections.  Breastfed babies are less likely to develop asthma, allergies, obesity, or type 2 diabetes. They are also at lower risk for sudden infant death syndrome (SIDS).  Nutrients in breast milk  are better able to meet your baby's needs compared to infant formula.  Breast milk improves your baby's brain development. For you  Breastfeeding helps to create a very special bond between you and your baby.  Breastfeeding is convenient. Breast milk costs nothing and is always available at the correct temperature.  Breastfeeding helps to burn calories. It helps you to lose the weight that you gained during pregnancy.  Breastfeeding makes your uterus return faster to its size before pregnancy. It also slows bleeding (lochia) after you give birth.  Breastfeeding helps to lower your risk of developing type 2 diabetes, osteoporosis, rheumatoid arthritis, cardiovascular disease, and breast, ovarian, uterine, and endometrial cancer later in life. Breastfeeding basics Starting breastfeeding  Find a comfortable place to sit or lie down, with your neck and back well-supported.  Place a pillow or a rolled-up blanket under your baby to bring him or her to the level of your breast (if you are seated). Nursing pillows are specially designed to help support your arms and your baby while you breastfeed.  Make sure that your baby's tummy (abdomen) is facing your abdomen.  Gently massage your breast. With your fingertips, massage from the outer edges of your breast inward toward the nipple. This encourages milk flow. If your milk flows slowly, you may need to continue this action during the feeding.  Support your breast with 4 fingers underneath and your thumb above your nipple (make the letter "C" with your hand). Make sure your fingers are well away from your nipple and your baby's  mouth.  Stroke your baby's lips gently with your finger or nipple.  When your baby's mouth is open wide enough, quickly bring your baby to your breast, placing your entire nipple and as much of the areola as possible into your baby's mouth. The areola is the colored area around your nipple. ? More areola should be visible above your baby's upper lip than below the lower lip. ? Your baby's lips should be opened and extended outward (flanged) to ensure an adequate, comfortable latch. ? Your baby's tongue should be between his or her lower gum and your breast.  Make sure that your baby's mouth is correctly positioned around your nipple (latched). Your baby's lips should create a seal on your breast and be turned out (everted).  It is common for your baby to suck about 2-3 minutes in order to start the flow of breast milk. Latching Teaching your baby how to latch onto your breast properly is very important. An improper latch can cause nipple pain, decreased milk supply, and poor weight gain in your baby. Also, if your baby is not latched onto your nipple properly, he or she may swallow some air during feeding. This can make your baby fussy. Burping your baby when you switch breasts during the feeding can help to get rid of the air. However, teaching your baby to latch on properly is still the best way to prevent fussiness from swallowing air while breastfeeding. Signs that your baby has successfully latched onto your nipple  Silent tugging or silent sucking, without causing you pain. Infant's lips should be extended outward (flanged).  Swallowing heard between every 3-4 sucks once your milk has started to flow (after your let-down milk reflex occurs).  Muscle movement above and in front of his or her ears while sucking. Signs that your baby has not successfully latched onto your nipple  Sucking sounds or smacking sounds from your baby while breastfeeding.  Nipple pain. If  you think your baby  has not latched on correctly, slip your finger into the corner of your baby's mouth to break the suction and place it between your baby's gums. Attempt to start breastfeeding again. Signs of successful breastfeeding Signs from your baby  Your baby will gradually decrease the number of sucks or will completely stop sucking.  Your baby will fall asleep.  Your baby's body will relax.  Your baby will retain a small amount of milk in his or her mouth.  Your baby will let go of your breast by himself or herself. Signs from you  Breasts that have increased in firmness, weight, and size 1-3 hours after feeding.  Breasts that are softer immediately after breastfeeding.  Increased milk volume, as well as a change in milk consistency and color by the fifth day of breastfeeding.  Nipples that are not sore, cracked, or bleeding. Signs that your baby is getting enough milk  Wetting at least 1-2 diapers during the first 24 hours after birth.  Wetting at least 5-6 diapers every 24 hours for the first week after birth. The urine should be clear or pale yellow by the age of 5 days.  Wetting 6-8 diapers every 24 hours as your baby continues to grow and develop.  At least 3 stools in a 24-hour period by the age of 5 days. The stool should be soft and yellow.  At least 3 stools in a 24-hour period by the age of 7 days. The stool should be seedy and yellow.  No loss of weight greater than 10% of birth weight during the first 3 days of life.  Average weight gain of 4-7 oz (113-198 g) per week after the age of 4 days.  Consistent daily weight gain by the age of 5 days, without weight loss after the age of 2 weeks. After a feeding, your baby may spit up a small amount of milk. This is normal. Breastfeeding frequency and duration Frequent feeding will help you make more milk and can prevent sore nipples and extremely full breasts (breast engorgement). Breastfeed when you feel the need to reduce the  fullness of your breasts or when your baby shows signs of hunger. This is called "breastfeeding on demand." Signs that your baby is hungry include:  Increased alertness, activity, or restlessness.  Movement of the head from side to side.  Opening of the mouth when the corner of the mouth or cheek is stroked (rooting).  Increased sucking sounds, smacking lips, cooing, sighing, or squeaking.  Hand-to-mouth movements and sucking on fingers or hands.  Fussing or crying. Avoid introducing a pacifier to your baby in the first 4-6 weeks after your baby is born. After this time, you may choose to use a pacifier. Research has shown that pacifier use during the first year of a baby's life decreases the risk of sudden infant death syndrome (SIDS). Allow your baby to feed on each breast as long as he or she wants. When your baby unlatches or falls asleep while feeding from the first breast, offer the second breast. Because newborns are often sleepy in the first few weeks of life, you may need to awaken your baby to get him or her to feed. Breastfeeding times will vary from baby to baby. However, the following rules can serve as a guide to help you make sure that your baby is properly fed:  Newborns (babies 23 weeks of age or younger) may breastfeed every 1-3 hours.  Newborns should not go  without breastfeeding for longer than 3 hours during the day or 5 hours during the night.  You should breastfeed your baby a minimum of 8 times in a 24-hour period. Breast milk pumping     Pumping and storing breast milk allows you to make sure that your baby is exclusively fed your breast milk, even at times when you are unable to breastfeed. This is especially important if you go back to work while you are still breastfeeding, or if you are not able to be present during feedings. Your lactation consultant can help you find a method of pumping that works best for you and give you guidelines about how long it is safe  to store breast milk. Caring for your breasts while you breastfeed Nipples can become dry, cracked, and sore while breastfeeding. The following recommendations can help keep your breasts moisturized and healthy:  Avoid using soap on your nipples.  Wear a supportive bra designed especially for nursing. Avoid wearing underwire-style bras or extremely tight bras (sports bras).  Air-dry your nipples for 3-4 minutes after each feeding.  Use only cotton bra pads to absorb leaked breast milk. Leaking of breast milk between feedings is normal.  Use lanolin on your nipples after breastfeeding. Lanolin helps to maintain your skin's normal moisture barrier. Pure lanolin is not harmful (not toxic) to your baby. You may also hand express a few drops of breast milk and gently massage that milk into your nipples and allow the milk to air-dry. In the first few weeks after giving birth, some women experience breast engorgement. Engorgement can make your breasts feel heavy, warm, and tender to the touch. Engorgement peaks within 3-5 days after you give birth. The following recommendations can help to ease engorgement:  Completely empty your breasts while breastfeeding or pumping. You may want to start by applying warm, moist heat (in the shower or with warm, water-soaked hand towels) just before feeding or pumping. This increases circulation and helps the milk flow. If your baby does not completely empty your breasts while breastfeeding, pump any extra milk after he or she is finished.  Apply ice packs to your breasts immediately after breastfeeding or pumping, unless this is too uncomfortable for you. To do this: ? Put ice in a plastic bag. ? Place a towel between your skin and the bag. ? Leave the ice on for 20 minutes, 2-3 times a day.  Make sure that your baby is latched on and positioned properly while breastfeeding. If engorgement persists after 48 hours of following these recommendations, contact your  health care provider or a Science writer. Overall health care recommendations while breastfeeding  Eat 3 healthy meals and 3 snacks every day. Well-nourished mothers who are breastfeeding need an additional 450-500 calories a day. You can meet this requirement by increasing the amount of a balanced diet that you eat.  Drink enough water to keep your urine pale yellow or clear.  Rest often, relax, and continue to take your prenatal vitamins to prevent fatigue, stress, and low vitamin and mineral levels in your body (nutrient deficiencies).  Do not use any products that contain nicotine or tobacco, such as cigarettes and e-cigarettes. Your baby may be harmed by chemicals from cigarettes that pass into breast milk and exposure to secondhand smoke. If you need help quitting, ask your health care provider.  Avoid alcohol.  Do not use illegal drugs or marijuana.  Talk with your health care provider before taking any medicines. These include over-the-counter and  prescription medicines as well as vitamins and herbal supplements. Some medicines that may be harmful to your baby can pass through breast milk.  It is possible to become pregnant while breastfeeding. If birth control is desired, ask your health care provider about options that will be safe while breastfeeding your baby. Where to find more information: [employer]: www.llli.org Contact a health care provider if:  You feel like you want to stop breastfeeding or have become frustrated with breastfeeding.  Your nipples are cracked or bleeding.  Your breasts are red, tender, or warm.  You have: ? Painful breasts or nipples. ? A swollen area on either breast. ? A fever or chills. ? Nausea or vomiting. ? Drainage other than breast milk from your nipples.  Your breasts do not become full before feedings by the fifth day after you give birth.  You feel sad and depressed.  Your baby is: ? Too sleepy to eat  well. ? Having trouble sleeping. ? More than 56 week old and wetting fewer than 6 diapers in a 24-hour period. ? Not gaining weight by 56 days of age.  Your baby has fewer than 3 stools in a 24-hour period.  Your baby's skin or the white parts of his or her eyes become yellow. Get help right away if:  Your baby is overly tired (lethargic) and does not want to wake up and feed.  Your baby develops an unexplained fever. Summary  Breastfeeding offers many health benefits for infant and mothers.  Try to breastfeed your infant when he or she shows early signs of hunger.  Gently tickle or stroke your baby's lips with your finger or nipple to allow the baby to open his or her mouth. Bring the baby to your breast. Make sure that much of the areola is in your baby's mouth. Offer one side and burp the baby before you offer the other side.  Talk with your health care provider or lactation consultant if you have questions or you face problems as you breastfeed. This information is not intended to replace advice given to you by your health care provider. Make sure you discuss any questions you have with your health care provider. Document Revised: 01/27/2018 Document Reviewed: 12/04/2016 Elsevier Patient Education  [place].

## 2020-03-29 NOTE — Progress Notes (Signed)
ROB-Pt present for routine prenatal care. Pt c/o of lower abd pain, vaginal pain and lower back pain.

## 2020-03-30 ENCOUNTER — Encounter: Payer: Self-pay | Admitting: Certified Nurse Midwife

## 2020-03-30 ENCOUNTER — Inpatient Hospital Stay
Admission: EM | Admit: 2020-03-30 | Discharge: 2020-04-01 | DRG: 806 | Disposition: A | Discharge status: Home / Self Care | Payer: BC Managed Care – PPO | Attending: Certified Nurse Midwife | Admitting: Certified Nurse Midwife

## 2020-03-30 ENCOUNTER — Inpatient Hospital Stay: Payer: BC Managed Care – PPO | Admitting: Anesthesiology

## 2020-03-30 ENCOUNTER — Other Ambulatory Visit: Payer: Self-pay

## 2020-03-30 DIAGNOSIS — O9912 Other diseases of the blood and blood-forming organs and certain disorders involving the immune mechanism complicating childbirth: Secondary | ICD-10-CM | POA: Diagnosis present

## 2020-03-30 DIAGNOSIS — O9902 Anemia complicating childbirth: Principal | ICD-10-CM | POA: Diagnosis present

## 2020-03-30 DIAGNOSIS — D696 Thrombocytopenia, unspecified: Secondary | ICD-10-CM | POA: Diagnosis present

## 2020-03-30 DIAGNOSIS — D509 Iron deficiency anemia, unspecified: Secondary | ICD-10-CM | POA: Diagnosis present

## 2020-03-30 DIAGNOSIS — R0789 Other chest pain: Secondary | ICD-10-CM | POA: Diagnosis not present

## 2020-03-30 DIAGNOSIS — Z20822 Contact with and (suspected) exposure to covid-19: Secondary | ICD-10-CM | POA: Diagnosis present

## 2020-03-30 DIAGNOSIS — O99824 Streptococcus B carrier state complicating childbirth: Secondary | ICD-10-CM | POA: Diagnosis present

## 2020-03-30 DIAGNOSIS — I959 Hypotension, unspecified: Secondary | ICD-10-CM | POA: Diagnosis not present

## 2020-03-30 DIAGNOSIS — Z3A39 39 weeks gestation of pregnancy: Secondary | ICD-10-CM

## 2020-03-30 DIAGNOSIS — R079 Chest pain, unspecified: Secondary | ICD-10-CM | POA: Diagnosis not present

## 2020-03-30 DIAGNOSIS — Z862 Personal history of diseases of the blood and blood-forming organs and certain disorders involving the immune mechanism: Secondary | ICD-10-CM

## 2020-03-30 DIAGNOSIS — O99893 Other specified diseases and conditions complicating puerperium: Secondary | ICD-10-CM | POA: Diagnosis not present

## 2020-03-30 DIAGNOSIS — O26893 Other specified pregnancy related conditions, third trimester: Secondary | ICD-10-CM | POA: Diagnosis present

## 2020-03-30 LAB — TYPE AND SCREEN
ABO/RH(D): A POS
Antibody Screen: NEGATIVE

## 2020-03-30 LAB — CBC
HCT: 32 % — ABNORMAL LOW (ref 36.0–46.0)
Hemoglobin: 10.8 g/dL — ABNORMAL LOW (ref 12.0–15.0)
MCH: 28.8 pg (ref 26.0–34.0)
MCHC: 33.8 g/dL (ref 30.0–36.0)
MCV: 85.3 fL (ref 80.0–100.0)
Platelets: 128 10*3/uL — ABNORMAL LOW (ref 150–400)
RBC: 3.75 MIL/uL — ABNORMAL LOW (ref 3.87–5.11)
RDW: 14.7 % (ref 11.5–15.5)
WBC: 14.1 10*3/uL — ABNORMAL HIGH (ref 4.0–10.5)
nRBC: 0 % (ref 0.0–0.2)

## 2020-03-30 LAB — SARS CORONAVIRUS 2 BY RT PCR (HOSPITAL ORDER, PERFORMED IN ~~LOC~~ HOSPITAL LAB): SARS Coronavirus 2: NEGATIVE

## 2020-03-30 LAB — RPR: RPR Ser Ql: NONREACTIVE

## 2020-03-30 MED ORDER — METHYLERGONOVINE MALEATE 0.2 MG/ML IJ SOLN: 0.2000 mg | INTRAMUSCULAR | Status: DC | PRN

## 2020-03-30 MED ORDER — EPHEDRINE 5 MG/ML INJ
10.0000 mg | INTRAVENOUS | Status: DC | PRN
  Filled 2020-03-30: qty 2

## 2020-03-30 MED ORDER — ONDANSETRON HCL 4 MG/2ML IJ SOLN: 4.0000 mg | Freq: Four times a day (QID) | INTRAMUSCULAR | Status: DC | PRN

## 2020-03-30 MED ORDER — MISOPROSTOL 50MCG HALF TABLET
50.0000 ug | ORAL_TABLET | ORAL | Status: DC
  Administered 2020-03-30 (×2): 50 ug via VAGINAL
  Filled 2020-03-30 (×2): qty 1

## 2020-03-30 MED ORDER — FENTANYL 2.5 MCG/ML W/ROPIVACAINE 0.15% IN NS 100 ML EPIDURAL (ARMC)
12.0000 mL/h | EPIDURAL | Status: DC
  Administered 2020-03-30: 12 mL/h via EPIDURAL
  Filled 2020-03-30: qty 100

## 2020-03-30 MED ORDER — PHENYLEPHRINE 40 MCG/ML (10ML) SYRINGE FOR IV PUSH (FOR BLOOD PRESSURE SUPPORT)
80.0000 ug | PREFILLED_SYRINGE | INTRAVENOUS | Status: DC | PRN
  Filled 2020-03-30: qty 10

## 2020-03-30 MED ORDER — TERBUTALINE SULFATE 1 MG/ML IJ SOLN: 0.2500 mg | Freq: Once | INTRAMUSCULAR | Status: DC | PRN

## 2020-03-30 MED ORDER — DIBUCAINE (PERIANAL) 1 % EX OINT
1.0000 "application " | TOPICAL_OINTMENT | CUTANEOUS | Status: DC | PRN
  Filled 2020-03-30 (×2): qty 28

## 2020-03-30 MED ORDER — BENZOCAINE-MENTHOL 20-0.5 % EX AERO
1.0000 "application " | INHALATION_SPRAY | CUTANEOUS | Status: DC | PRN
  Filled 2020-03-30: qty 56

## 2020-03-30 MED ORDER — LACTATED RINGERS IV SOLN: 500.0000 mL | Freq: Once | INTRAVENOUS | Status: DC

## 2020-03-30 MED ORDER — SIMETHICONE 80 MG PO CHEW
80.0000 mg | CHEWABLE_TABLET | ORAL | Status: DC | PRN
  Administered 2020-03-31 (×2): 80 mg via ORAL
  Filled 2020-03-30 (×3): qty 1

## 2020-03-30 MED ORDER — OXYCODONE-ACETAMINOPHEN 5-325 MG PO TABS
1.0000 | ORAL_TABLET | ORAL | Status: DC | PRN
  Administered 2020-03-31 (×2): 1 via ORAL
  Filled 2020-03-30 (×2): qty 1

## 2020-03-30 MED ORDER — ONDANSETRON HCL 4 MG/2ML IJ SOLN: 4.0000 mg | INTRAMUSCULAR | Status: DC | PRN

## 2020-03-30 MED ORDER — WITCH HAZEL-GLYCERIN EX PADS
1.0000 "application " | MEDICATED_PAD | CUTANEOUS | Status: DC | PRN
  Filled 2020-03-30 (×2): qty 100

## 2020-03-30 MED ORDER — LACTATED RINGERS IV SOLN
INTRAVENOUS | Status: DC
  Administered 2020-03-30: 1000 mL via INTRAVENOUS

## 2020-03-30 MED ORDER — SODIUM CHLORIDE 0.9 % IV SOLN
5.0000 10*6.[IU] | Freq: Once | INTRAVENOUS | Status: AC
  Administered 2020-03-30: 5 10*6.[IU] via INTRAVENOUS
  Filled 2020-03-30: qty 5

## 2020-03-30 MED ORDER — SENNOSIDES-DOCUSATE SODIUM 8.6-50 MG PO TABS
2.0000 | ORAL_TABLET | ORAL | Status: DC
  Administered 2020-03-31 – 2020-04-01 (×2): 2 via ORAL
  Filled 2020-03-30 (×2): qty 2

## 2020-03-30 MED ORDER — OXYCODONE-ACETAMINOPHEN 5-325 MG PO TABS
2.0000 | ORAL_TABLET | ORAL | Status: DC | PRN
  Administered 2020-03-30: 2 via ORAL
  Filled 2020-03-30: qty 2

## 2020-03-30 MED ORDER — DOCUSATE SODIUM 100 MG PO CAPS
100.0000 mg | ORAL_CAPSULE | Freq: Two times a day (BID) | ORAL | Status: DC
  Administered 2020-03-30 – 2020-03-31 (×3): 100 mg via ORAL
  Filled 2020-03-30 (×3): qty 1

## 2020-03-30 MED ORDER — ACETAMINOPHEN 325 MG PO TABS
650.0000 mg | ORAL_TABLET | ORAL | Status: DC | PRN
  Administered 2020-03-30: 650 mg via ORAL
  Filled 2020-03-30: qty 2

## 2020-03-30 MED ORDER — LIDOCAINE-EPINEPHRINE (PF) 1.5 %-1:200000 IJ SOLN
INTRAMUSCULAR | Status: DC | PRN
  Administered 2020-03-30: 3 mL via EPIDURAL

## 2020-03-30 MED ORDER — BUTORPHANOL TARTRATE 1 MG/ML IJ SOLN
1.0000 mg | INTRAMUSCULAR | Status: DC | PRN
  Administered 2020-03-30: 1 mg via INTRAVENOUS
  Filled 2020-03-30: qty 1

## 2020-03-30 MED ORDER — FENTANYL 2.5 MCG/ML W/ROPIVACAINE 0.15% IN NS 100 ML EPIDURAL (ARMC)
EPIDURAL | Status: AC
  Filled 2020-03-30: qty 100

## 2020-03-30 MED ORDER — MISOPROSTOL 200 MCG PO TABS
ORAL_TABLET | ORAL | Status: AC
  Administered 2020-03-30: 800 ug
  Filled 2020-03-30: qty 4

## 2020-03-30 MED ORDER — FERROUS SULFATE 325 (65 FE) MG PO TABS
325.0000 mg | ORAL_TABLET | Freq: Every day | ORAL | Status: DC
  Administered 2020-03-31 – 2020-04-01 (×2): 325 mg via ORAL
  Filled 2020-03-30 (×3): qty 1

## 2020-03-30 MED ORDER — OXYTOCIN 40 UNITS IN NORMAL SALINE INFUSION - SIMPLE MED
2.5000 [IU]/h | INTRAVENOUS | Status: DC
  Filled 2020-03-30: qty 1000

## 2020-03-30 MED ORDER — LACTATED RINGERS IV SOLN
500.0000 mL | INTRAVENOUS | Status: DC | PRN
  Administered 2020-03-30 (×2): 500 mL via INTRAVENOUS

## 2020-03-30 MED ORDER — OXYTOCIN 10 UNIT/ML IJ SOLN
INTRAMUSCULAR | Status: AC
  Filled 2020-03-30: qty 2

## 2020-03-30 MED ORDER — METHYLERGONOVINE MALEATE 0.2 MG/ML IJ SOLN
INTRAMUSCULAR | Status: AC
  Filled 2020-03-30: qty 1

## 2020-03-30 MED ORDER — IBUPROFEN 600 MG PO TABS
600.0000 mg | ORAL_TABLET | Freq: Four times a day (QID) | ORAL | Status: DC
  Administered 2020-03-30 – 2020-04-01 (×7): 600 mg via ORAL
  Filled 2020-03-30 (×7): qty 1

## 2020-03-30 MED ORDER — FENTANYL 2.5 MCG/ML W/ROPIVACAINE 0.15% IN NS 100 ML EPIDURAL (ARMC)
EPIDURAL | Status: DC | PRN
  Administered 2020-03-30: 12 mL/h via EPIDURAL

## 2020-03-30 MED ORDER — ONDANSETRON HCL 4 MG PO TABS: 4.0000 mg | ORAL_TABLET | ORAL | Status: DC | PRN

## 2020-03-30 MED ORDER — LIDOCAINE HCL (PF) 1 % IJ SOLN
30.0000 mL | INTRAMUSCULAR | Status: AC | PRN
  Administered 2020-03-30: 1 mL via SUBCUTANEOUS
  Administered 2020-03-30: 1.2 mL via SUBCUTANEOUS
  Filled 2020-03-30: qty 30

## 2020-03-30 MED ORDER — METHYLERGONOVINE MALEATE 0.2 MG PO TABS
0.2000 mg | ORAL_TABLET | ORAL | Status: DC | PRN
  Filled 2020-03-30: qty 1

## 2020-03-30 MED ORDER — OXYTOCIN 40 UNITS IN NORMAL SALINE INFUSION - SIMPLE MED
1.0000 m[IU]/min | INTRAVENOUS | Status: DC
  Administered 2020-03-30: 2 m[IU]/min via INTRAVENOUS

## 2020-03-30 MED ORDER — AMMONIA AROMATIC IN INHA
RESPIRATORY_TRACT | Status: AC
  Filled 2020-03-30: qty 10

## 2020-03-30 MED ORDER — PRENATAL MULTIVITAMIN CH
1.0000 | ORAL_TABLET | Freq: Every day | ORAL | Status: DC
  Administered 2020-03-31: 1 via ORAL
  Filled 2020-03-30: qty 1

## 2020-03-30 MED ORDER — OXYTOCIN BOLUS FROM INFUSION
500.0000 mL | Freq: Once | INTRAVENOUS | Status: AC
  Administered 2020-03-30: 500 mL via INTRAVENOUS

## 2020-03-30 MED ORDER — DIPHENHYDRAMINE HCL 50 MG/ML IJ SOLN: 12.5000 mg | INTRAMUSCULAR | Status: DC | PRN

## 2020-03-30 MED ORDER — COCONUT OIL OIL
1.0000 "application " | TOPICAL_OIL | Status: DC | PRN
  Administered 2020-03-31: 1 via TOPICAL
  Filled 2020-03-30: qty 120

## 2020-03-30 MED ORDER — PENICILLIN G 3 MILLION UNITS IVPB - SIMPLE MED
3.0000 10*6.[IU] | INTRAVENOUS | Status: DC
  Administered 2020-03-30 (×2): 3 10*6.[IU] via INTRAVENOUS
  Filled 2020-03-30 (×2): qty 3
  Filled 2020-03-30 (×2): qty 100

## 2020-03-30 MED ORDER — ACETAMINOPHEN 325 MG PO TABS
650.0000 mg | ORAL_TABLET | ORAL | Status: DC | PRN
  Administered 2020-03-31 – 2020-04-01 (×3): 650 mg via ORAL
  Filled 2020-03-30 (×3): qty 2

## 2020-03-30 MED ORDER — SOD CITRATE-CITRIC ACID 500-334 MG/5ML PO SOLN: 30.0000 mL | ORAL | Status: DC | PRN

## 2020-03-30 NOTE — Anesthesia Procedure Notes (Signed)
Epidural Patient location during procedure: OB Start time: 03/30/2020 9:28 AM End time: 03/30/2020 9:55 AM  Staffing Performed: anesthesiologist   Preanesthetic Checklist Completed: patient identified, IV checked, site marked, risks and benefits discussed, surgical consent, monitors and equipment checked, pre-op evaluation and timeout performed  Epidural Patient position: sitting Prep: Betadine Patient monitoring: heart rate, continuous pulse ox and blood pressure Approach: midline Location: L4-L5 Injection technique: LOR saline  Needle:  Needle type: Tuohy  Needle gauge: 17 G Needle length: 9 cm and 9 Needle insertion depth: 7 cm Catheter type: closed end flexible Catheter size: 20 Guage Catheter at skin depth: 13 cm Test dose: negative and 1.5% lidocaine with Epi 1:200 K  Assessment Events: blood not aspirated, injection not painful, no injection resistance, no paresthesia and negative IV test  Additional Notes   Patient tolerated the insertion well without complications.Reason for block:procedure for pain

## 2020-03-30 NOTE — Progress Notes (Signed)
Subjective:    Pt had chest pain during beginning of 2nd stage.  Partly resolved with pushing.  Objective:    Patient Vitals for the past 2 hrs:  Resp SpO2  03/30/20 1544 -- 97 %  03/30/20 1539 -- 94 %  03/30/20 1529 -- 96 %  03/30/20 1524 -- 97 %  03/30/20 1519 -- 97 %  03/30/20 1514 18 96 %  03/30/20 1509 -- 97 %  03/30/20 1508 -- 97 %  03/30/20 1504 -- 97 %  03/30/20 1503 -- 97 %  03/30/20 1501 -- 97 %  03/30/20 1456 -- 98 %  03/30/20 1454 -- 98 %  03/30/20 1449 -- 98 %  03/30/20 1448 -- 98 %  03/30/20 1444 -- 97 %   Total I/O In: 500 [I.V.:500] Out: 335 [Urine:335]  Labs: Results for orders placed or performed during the hospital encounter of 03/30/20 (from the past 24 hour(s))  SARS Coronavirus 2 by RT PCR (hospital order, performed in [facility] hospital lab) Nasopharyngeal Nasopharyngeal Swab     Status: None   Collection Time: 03/30/20 12:33 AM   Specimen: Nasopharyngeal Swab  Result Value Ref Range   SARS Coronavirus 2 NEGATIVE NEGATIVE  CBC     Status: Abnormal   Collection Time: 03/30/20 12:33 AM  Result Value Ref Range   WBC 14.1 (H) 4.0 - 10.5 K/uL   RBC 3.75 (L) 3.87 - 5.11 MIL/uL   Hemoglobin 10.8 (L) 12.0 - 15.0 g/dL   HCT 33.8 (L) 25.0 - 53.9 %   MCV 85.3 80.0 - 100.0 fL   MCH 28.8 26.0 - 34.0 pg   MCHC 33.8 30.0 - 36.0 g/dL   RDW 76.7 34.1 - 93.7 %   Platelets 128 (L) 150 - 400 K/uL   nRBC 0.0 0.0 - 0.2 %  RPR     Status: None   Collection Time: 03/30/20 12:33 AM  Result Value Ref Range   RPR Ser Ql NON REACTIVE NON REACTIVE  Type and screen     Status: None   Collection Time: 03/30/20 12:33 AM  Result Value Ref Range   ABO/RH(D) A POS    Antibody Screen NEG    Sample Expiration      04/02/2020,2359 Performed at [facility], [address] [place], [place] [address]     Medications    Current Discharge Medication List    CONTINUE these medications which have NOT CHANGED   Details  Iron-FA-B Cmp-C-Biot-Probiotic  (FUSION PLUS) CAPS Take 1 tablet by mouth daily. Qty: 30 capsule, Refills: 4    Prenatal Vit-Fe Fumarate-FA (PRENATAL MULTIVITAMIN) TABS tablet Take 1 tablet by mouth daily at 12 noon.         Pt not pushing effectively.  Claims too tired to push.  Will only push twice per contraction.  Pelvic:  +2sta.  Head dilating labia and on perineum. Adequate room.  When pt tries to push - vertex moves.   Fetal tachy.  Recent accels -    Vac delivery appropriate.   Vac applied and traction used with each contraction.  Fetal vertex moved.  After @5  min a pop-off occurred.  Pt then pushed with the head crowning over @ 3 contractions.  Could not provide enough maternal effort to deliver.  Vac reapplied and over 2 pushes gentle traction was employed.  The vertex delivered followed by shoulders and body.    See remainder of delivery note for additional details.    Assessment:  Vac delivery done  Plan:    Delivery completed.  Finis Bud, M.D. 03/30/2020 4:41 PM

## 2020-03-30 NOTE — H&P (Signed)
History and Physical   HPI  Alexandra Joseph is a 24 y.o. G2P1001 at [redacted]w[redacted]d Estimated Date of Delivery: 04/01/20 who is being admitted for induction of labor.    OB History  OB History  Gravida Para Term Preterm AB Living  2 1 1  0 0 1  SAB TAB Ectopic Multiple Live Births  0 0 0 0 1    # Outcome Date GA Lbr Len/2nd Weight Sex Delivery Anes PTL Lv  2 Current           1 Term 07/23/17 [redacted]w[redacted]d  3459 g M  EPI N LIV     Complications: Other Excessive Bleeding    Obstetric Comments  Retained placenta and D&C 1 day PP, anemia resulted with blood transfused.    PROBLEM LIST  Pregnancy complications or risks: Patient Active Problem List   Diagnosis Date Noted  . Labor and delivery, indication for care 03/30/2020  . Group beta Strep positive 03/11/2020  . History of oligohydramnios in prior pregnancy, currently pregnant 03/11/2020  . Thrombocytopenia affecting pregnancy (HCC) 02/15/2020  . History of postpartum hemorrhage 01/12/2020  . History of retained placenta 01/12/2020    Prenatal labs and studies: ABO, Rh: --/--/A POS (05/15 0033) Antibody: NEG (05/15 0033) Rubella: 1.69 (10/01 1049) RPR: Non Reactive (02/25 1031)  HBsAg: Negative (10/01 1049)  HIV: Non Reactive (10/01 1049)  10-30-1987-- (04/19 1510)   Past Medical History:  Diagnosis Date  . Anemia   . Anxiety   . Costochondritis   . Depression   . Menorrhagia   . Migraine   . Tachycardia      Past Surgical History:  Procedure Laterality Date  . ADENOIDECTOMY    . DILATION AND EVACUATION N/A 07/23/2017   Procedure: DILATATION AND EVACUATION;  Surgeon: 09/22/2017, MD;  Location: [facility] ORS;  Service: Gynecology;  Laterality: N/A;  . TONSILLECTOMY       Medications    Current Discharge Medication List    CONTINUE these medications which have NOT CHANGED   Details  Iron-FA-B Cmp-C-Biot-Probiotic (FUSION PLUS) CAPS Take 1 tablet by mouth daily. Qty: 30 capsule, Refills: 4    Prenatal  Vit-Fe Fumarate-FA (PRENATAL MULTIVITAMIN) TABS tablet Take 1 tablet by mouth daily at 12 noon.         Allergies  Patient has no known allergies.  Review of Systems  Constitutional: negative Eyes: negative Ears, nose, mouth, throat, and face: negative Respiratory: negative Cardiovascular: negative Gastrointestinal: negative Genitourinary:negative Integument/breast: negative Hematologic/lymphatic: negative Musculoskeletal:negative Neurological: negative Behavioral/Psych: negative Endocrine: negative Allergic/Immunologic: negative  Physical Exam  BP 127/65   Pulse 90   Temp 98.3 F (36.8 C) (Oral)   Resp 16   Ht 5' (1.524 m)   Wt 78.6 kg   LMP 06/24/2019   SpO2 99%   BMI 33.85 kg/m   Lungs:  CTA B Cardio: RRR  Abd: Soft, gravid, NT Presentation: cephalic EXT: No C/C/ 1+ Edema DTRs: 2+ B CERVIX: Dilation: 4 Effacement (%): 80 Cervical Position: Middle Station: -2 Presentation: Vertex Exam by:: A Sneha Willig AROM-light mec.   See Prenatal records for more detailed PE.     FHR:  Baseline: 150 bpm, Variability: Good {> 6 bpm), Accelerations: Reactive and Decelerations: Absent  Toco: Uterine Contractions: Frequency: Every 1-3 minutes and Intensity: moderate  Test Results  Results for orders placed or performed during the hospital encounter of 03/30/20 (from the past 24 hour(s))  SARS Coronavirus 2 by RT PCR (hospital order, performed in  [facility] hospital lab) Nasopharyngeal Nasopharyngeal Swab     Status: None   Collection Time: 03/30/20 12:33 AM   Specimen: Nasopharyngeal Swab  Result Value Ref Range   SARS Coronavirus 2 NEGATIVE NEGATIVE  CBC     Status: Abnormal   Collection Time: 03/30/20 12:33 AM  Result Value Ref Range   WBC 14.1 (H) 4.0 - 10.5 K/uL   RBC 3.75 (L) 3.87 - 5.11 MIL/uL   Hemoglobin 10.8 (L) 12.0 - 15.0 g/dL   HCT 32.0 (L) 36.0 - 46.0 %   MCV 85.3 80.0 - 100.0 fL   MCH 28.8 26.0 - 34.0 pg   MCHC 33.8 30.0 - 36.0 g/dL    RDW 14.7 11.5 - 15.5 %   Platelets 128 (L) 150 - 400 K/uL   nRBC 0.0 0.0 - 0.2 %  Type and screen     Status: None   Collection Time: 03/30/20 12:33 AM  Result Value Ref Range   ABO/RH(D) A POS    Antibody Screen NEG    Sample Expiration      04/02/2020,2359 Performed at [facility], [address] [place], [place] [address]    Group B Strep positive  Assessment   G2P1001 at [redacted]w[redacted]d Estimated Date of Delivery: 04/01/20  The fetus is reassuring.   Patient Active Problem List   Diagnosis Date Noted  . Labor and delivery, indication for care 03/30/2020  . Group beta Strep positive 03/11/2020  . History of oligohydramnios in prior pregnancy, currently pregnant 03/11/2020  . Thrombocytopenia affecting pregnancy ([place] [place]) 02/15/2020  . History of postpartum hemorrhage 01/12/2020  . History of retained placenta 01/12/2020    Plan  1. Admitted to L&D :   2. EFM:-- Category 1 3. Stadol or Epidural if desired.   4. Admission labs completed 5. Anticipate NSVD  Finis Bud, M.D. 03/30/2020 9:21 AM

## 2020-03-30 NOTE — Progress Notes (Signed)
LABOR NOTE   Alexandra Joseph 23 y.o.@ at [redacted]w[redacted]d  SUBJECTIVE:  Comfortable with epidural. Feels pressure with contractions. Denies urge to push.  Analgesia: Epidural  OBJECTIVE:  BP (!) 110/43   Pulse 79   Temp 98.1 F (36.7 C) (Oral)   Resp 16   Ht 5' (1.524 m)   Wt 78.6 kg   LMP 06/24/2019   SpO2 99%   BMI 33.85 kg/m  Total I/O In: 500 [I.V.:500] Out: -   She has shown cervical change. CERVIX: 7cm:  70%:   -2:   mid position:   soft SVE:   Dilation: 7 Effacement (%): 70 Station: -2 Exam by:: Alexandra Joseph CNM CONTRACTIONS: regular, every 2 minutes FHR: Fetal heart tracing reviewed. Baseline: 150 bpm, Variability: Good {> 6 bpm), Accelerations: Reactive and Decelerations: Early Category I   Labs: Lab Results  Component Value Date   WBC 14.1 (H) 03/30/2020   HGB 10.8 (L) 03/30/2020   HCT 32.0 (L) 03/30/2020   MCV 85.3 03/30/2020   PLT 128 (L) 03/30/2020    ASSESSMENT: 1) Labor curve reviewed.       Progress: Active phase labor.     Membranes: ruptured, meconium light  Active Problems:   Labor and delivery, indication for care Thrombocytopenia  PLAN: continue present management  Alexandra Joseph, CNM  03/30/2020 12:05 PM

## 2020-03-30 NOTE — Anesthesia Preprocedure Evaluation (Signed)
Anesthesia Evaluation  Patient identified by MRN, date of birth, ID band Patient awake    Reviewed: Allergy & Precautions, NPO status , Patient's Chart, lab work & pertinent test results  History of Anesthesia Complications Negative for: history of anesthetic complications  Airway Mallampati: II       Dental   Pulmonary neg sleep apnea, neg COPD, Not current smoker,           Cardiovascular (-) hypertension(-) Past MI and (-) CHF (-) dysrhythmias (-) Valvular Problems/Murmurs     Neuro/Psych neg Seizures Anxiety Depression    GI/Hepatic Neg liver ROS, neg GERD  ,  Endo/Other  neg diabetes  Renal/GU negative Renal ROS     Musculoskeletal   Abdominal   Peds  Hematology   Anesthesia Other Findings   Reproductive/Obstetrics                             Anesthesia Physical Anesthesia Plan  ASA: II  Anesthesia Plan: Epidural   Post-op Pain Management:    Induction:   PONV Risk Score and Plan:   Airway Management Planned:   Additional Equipment:   Intra-op Plan:   Post-operative Plan:   Informed Consent: I have reviewed the patients History and Physical, chart, labs and discussed the procedure including the risks, benefits and alternatives for the proposed anesthesia with the patient or authorized representative who has indicated his/her understanding and acceptance.       Plan Discussed with:   Anesthesia Plan Comments:         Anesthesia Quick Evaluation

## 2020-03-30 NOTE — Progress Notes (Signed)
LABOR NOTE   Alexandra Joseph 23 y.o.@ at [redacted]w[redacted]d  SUBJECTIVE:  Pt feels pressure and urge to push Analgesia: Epidural  OBJECTIVE:  BP (!) 110/43   Pulse 79   Temp 98.3 F (36.8 C) (Oral)   Resp 16   Ht 5' (1.524 m)   Wt 78.6 kg   LMP 06/24/2019   SpO2 99%   BMI 33.85 kg/m  Total I/O In: 500 [I.V.:500] Out: -   She has shown cervical change. CERVIX: 9.5:  90%:   -1:   mid position:   soft SVE:   Dilation: Lip/rim Effacement (%): 80 Station: -1 Exam by:: A Thompson CNM CONTRACTIONS: regular, every 2 minutes FHR: Fetal heart tracing reviewed. Baseline: 150 bpm, Variability: Good {> 6 bpm), Accelerations: Reactive and Decelerations: Absent Category I    Labs: Lab Results  Component Value Date   WBC 14.1 (H) 03/30/2020   HGB 10.8 (L) 03/30/2020   HCT 32.0 (L) 03/30/2020   MCV 85.3 03/30/2020   PLT 128 (L) 03/30/2020    ASSESSMENT: 1) Labor curve reviewed.       Progress: Active phase labor.     Membranes: ruptured, meconium light     Blood show       Active Problems:   Labor and delivery, indication for care thrombocytopenia  PLAN: Attempt to reduce lip with pushing, Unable to reduce. FHT-tachycardia noted 170's (1403) PT on her right side, IV fluid bolus . Will labor for 20-30 min. Re-evaluate cervix. Pt-afebrile. Tylenol ordered. Dr. Logan Bores notified of fetal strip.    [name], CNM  03/30/2020 2:21 PM

## 2020-03-30 NOTE — Progress Notes (Signed)
LABOR NOTE   Alexandra Joseph 23 y.o.@ at [redacted]w[redacted]d  SUBJECTIVE:  uncomfortable with pressure. Denies pain. States that it is pressure Analgesia: Epidural  OBJECTIVE:  BP (!) 110/43   Pulse 79   Temp 98.8 F (37.1 C) (Axillary)   Resp 18   Ht 5' (1.524 m)   Wt 78.6 kg   LMP 06/24/2019   SpO2 96%   BMI 33.85 kg/m  Total I/O In: 500 [I.V.:500] Out: -   She has not shown cervical change. CERVIX: 9.5:  90%:   -2:   mid position:   Soft, cervix will reduce with pushing but comes back when not pushing.  SVE:   Dilation: 10 Effacement (%): 80 Station: -1 Exam by:: A Evonda Enge CNM CONTRACTIONS: regular, every 2 minutes FHR: Fetal heart tracing reviewed. Baseline: 170 bpm, Variability: Fair (1-6 bpm), Accelerations: Non-reactive but appropriate for gestational age and Decelerations: Early Category II   Labs: Lab Results  Component Value Date   WBC 14.1 (H) 03/30/2020   HGB 10.8 (L) 03/30/2020   HCT 32.0 (L) 03/30/2020   MCV 85.3 03/30/2020   PLT 128 (L) 03/30/2020    ASSESSMENT: 1) Labor curve reviewed.       Progress: Active phase labor  IUPC placed, pitocin started due to contractions strength.      Membranes: ruptured, bloody show.        Active Problems:   Labor and delivery, indication for care Thrombocytopenia   PLAN: Pitocin augmentation to improve contraction strength. Dr.Evans updated on pt status.   [name], CNM  03/30/2020 3:21 PM

## 2020-03-30 NOTE — Plan of Care (Signed)
Transferred to Room 350 from L&D. Teary, stating she feels fine and  stated "I don't know why I'm crying."  Instructed Pt. To eat after her assessment, which she did, and stated she felt,"better." Smiling and holding Infant. Oriented to room, POC and Fall Prevention. Pt. V/O. HX. PPH. Will follow Pt. Closely for this.Pt. Denies pain. She voided 400cc upon arrival to room. Assessment WNL. Will cont. To follow closely.

## 2020-03-30 NOTE — Progress Notes (Signed)
Pt in stable condition and ambulatory, bonding well with infant. Pt had urine output of at 1900 and of urine output at 2000 when transferred to mother baby RM 350. Report given to D.Laurence Compton, RN

## 2020-03-31 ENCOUNTER — Other Ambulatory Visit: Payer: Self-pay

## 2020-03-31 ENCOUNTER — Telehealth: Payer: Self-pay | Admitting: Internal Medicine

## 2020-03-31 DIAGNOSIS — R079 Chest pain, unspecified: Secondary | ICD-10-CM

## 2020-03-31 DIAGNOSIS — Z862 Personal history of diseases of the blood and blood-forming organs and certain disorders involving the immune mechanism: Secondary | ICD-10-CM

## 2020-03-31 LAB — HEPATIC FUNCTION PANEL
ALT: 15 U/L (ref 0–44)
AST: 27 U/L (ref 15–41)
Albumin: 2.4 g/dL — ABNORMAL LOW (ref 3.5–5.0)
Alkaline Phosphatase: 92 U/L (ref 38–126)
Bilirubin, Direct: <0.1 mg/dL (ref 0.0–0.2)
Total Bilirubin: 0.8 mg/dL (ref 0.3–1.2)
Total Protein: 4.7 g/dL — ABNORMAL LOW (ref 6.5–8.1)

## 2020-03-31 LAB — BASIC METABOLIC PANEL
Anion gap: 7 (ref 5–15)
BUN: <5 mg/dL — ABNORMAL LOW (ref 6–20)
CO2: 24 mmol/L (ref 22–32)
Calcium: 7.9 mg/dL — ABNORMAL LOW (ref 8.9–10.3)
Chloride: 108 mmol/L (ref 98–111)
Creatinine, Ser: 0.4 mg/dL — ABNORMAL LOW (ref 0.44–1.00)
GFR calc Af Amer: >60 mL/min (ref >60)
GFR calc non Af Amer: >60 mL/min (ref >60)
Glucose, Bld: 121 mg/dL — ABNORMAL HIGH (ref 70–99)
Potassium: 3.6 mmol/L (ref 3.5–5.1)
Sodium: 139 mmol/L (ref 135–145)

## 2020-03-31 LAB — CBC WITH DIFFERENTIAL/PLATELET
Abs Immature Granulocytes: 0.25 10*3/uL — ABNORMAL HIGH (ref 0.00–0.07)
Basophils Absolute: 0 10*3/uL (ref 0.0–0.1)
Basophils Relative: 0 %
Eosinophils Absolute: 0.1 10*3/uL (ref 0.0–0.5)
Eosinophils Relative: 1 %
HCT: 28.3 % — ABNORMAL LOW (ref 36.0–46.0)
Hemoglobin: 10 g/dL — ABNORMAL LOW (ref 12.0–15.0)
Immature Granulocytes: 1 %
Lymphocytes Relative: 15 %
Lymphs Abs: 3.4 10*3/uL (ref 0.7–4.0)
MCH: 29.2 pg (ref 26.0–34.0)
MCHC: 35.3 g/dL (ref 30.0–36.0)
MCV: 82.7 fL (ref 80.0–100.0)
Monocytes Absolute: 1.4 10*3/uL — ABNORMAL HIGH (ref 0.1–1.0)
Monocytes Relative: 6 %
Neutro Abs: 16.8 10*3/uL — ABNORMAL HIGH (ref 1.7–7.7)
Neutrophils Relative %: 77 %
Platelets: 129 10*3/uL — ABNORMAL LOW (ref 150–400)
RBC: 3.42 MIL/uL — ABNORMAL LOW (ref 3.87–5.11)
RDW: 14.9 % (ref 11.5–15.5)
WBC: 21.9 10*3/uL — ABNORMAL HIGH (ref 4.0–10.5)
nRBC: 0 % (ref 0.0–0.2)

## 2020-03-31 LAB — CBC
HCT: 28.1 % — ABNORMAL LOW (ref 36.0–46.0)
Hemoglobin: 9.5 g/dL — ABNORMAL LOW (ref 12.0–15.0)
MCH: 29 pg (ref 26.0–34.0)
MCHC: 33.8 g/dL (ref 30.0–36.0)
MCV: 85.7 fL (ref 80.0–100.0)
Platelets: 128 10*3/uL — ABNORMAL LOW (ref 150–400)
RBC: 3.28 MIL/uL — ABNORMAL LOW (ref 3.87–5.11)
RDW: 14.7 % (ref 11.5–15.5)
WBC: 21.8 10*3/uL — ABNORMAL HIGH (ref 4.0–10.5)
nRBC: 0 % (ref 0.0–0.2)

## 2020-03-31 LAB — TROPONIN I (HIGH SENSITIVITY)
Troponin I (High Sensitivity): 17 ng/L (ref <18)
Troponin I (High Sensitivity): 16 ng/L (ref <18)

## 2020-03-31 LAB — GLUCOSE, CAPILLARY: Glucose-Capillary: 130 mg/dL — ABNORMAL HIGH (ref 70–99)

## 2020-03-31 NOTE — Progress Notes (Signed)
Lab and Cardiopulmonary here.

## 2020-03-31 NOTE — Anesthesia Postprocedure Evaluation (Signed)
Anesthesia Post Note  Patient: Alexandra Joseph  Procedure(s) Performed: AN AD HOC LABOR EPIDURAL  Patient location during evaluation: Mother Baby Anesthesia Type: Epidural Level of consciousness: awake and alert Pain management: pain level controlled Vital Signs Assessment: post-procedure vital signs reviewed and stable Respiratory status: spontaneous breathing, nonlabored ventilation and respiratory function stable Cardiovascular status: stable Postop Assessment: no headache, no backache and epidural receding Anesthetic complications: no     Last Vitals:  Vitals:   03/31/20 0731 03/31/20 1115  BP: 119/68 (!) 125/51  Pulse: 84 81  Resp: 20 18  Temp: 36.6 C 36.5 C  SpO2: 100%     Last Pain:  Vitals:   03/31/20 1115  TempSrc: Oral  PainSc:                  KEPHART,WILLIAM K

## 2020-03-31 NOTE — Progress Notes (Signed)
Reassessment: 134/72, 86, 20, 98.1. Pt. Is alert with quiet affect. States her chest feels, "Better." But, c/o cramping and lower back pain and states she is passing gas. Percocet 1 tablet given and Simethicone given as per PRN order. Heating pad also provided for lower back pain. AJanee Morn CNM has called unit to say that she has placed a consult with E. Ouma NP. I informed Theotis Burrow of all the above. Will cont. To follow Pt. Closely.

## 2020-03-31 NOTE — Plan of Care (Signed)
Alert and oriented with aprop. affect. Color good, skin w&d. BBS Clear. HR Regular and Pt. Denies any further c/o chest discomfort. Assessments and VSS. Appears to be bonding well with Infant.

## 2020-03-31 NOTE — Telephone Encounter (Signed)
On 5/14-spoke to patient regarding platelets at 121 status post steroids. patient expecting induction on 5/14.  Informed Ms. Lawhorn.   Expect platelets to improve post delivery.  Follow-up as needed/at the discretion of referring provider. Discu

## 2020-03-31 NOTE — Consult Note (Addendum)
Medical Consultation   CAMRYNN Joseph  OAC:[id number]  DOB: 06/23/1996  DOA: 03/30/2020  PCP: Kandyce Rud, MD   Outpatient Specialists:   Requesting physician: Dr. Brennan Bailey  Reason for consultation: Chest pain    History of Present Illness: Alexandra Joseph is an 24 y.o. female status post vacuum-assisted vaginal delivery, induced at 39 weeks due to thrombocytopenia of uncertain etiology(gestational thrombocytopenia versus ITP,per hematology) who is being seen in consultation for chest pain.  Patient had an episode of chest pain during delivery that subsided spontaneously and then following delivery had another episode.  She describes the chest pain as a knot in the mid chest, similar to when food gets stuck.  It was of moderate intensity and nonradiating.  It was associated with shortness of breath with deep inspiration.  No cough fever or chills.  She denies associated nausea vomiting or diaphoresis or palpitations.  The pain started resolving spontaneously after 40 minutes.  Patient was given simethicone and then oxycodone by this nurse with subsequent resolution of the pain.  She denies leg pain.  She had mild leg swelling during her pregnancy which is no worse at this time.  She states that she feels perfectly normal at this time and back to her baseline.  She denies chest pain, shortness of breath.  She did endorse a history of anxiety some years prior when she had a similar episode but had had no problems until during the delivery.  Of note, patient was seen a week prior to the delivery due to concern for low platelet count of 78,000.  She was seen by hematologist, Dr. Donneta Romberg who treated her with steroids, bringing platelet count up to 128000 by the time of planned delivery.    Review of Systems:  Review of Systems  Constitutional: Negative.   HENT: Negative.   Eyes: Negative.   Respiratory: Positive for shortness of breath.   Cardiovascular: Positive for  chest pain and palpitations.  Gastrointestinal: Negative.   Genitourinary: Negative.   Musculoskeletal: Negative.   Skin: Negative.   Neurological: Negative.   Psychiatric/Behavioral: The patient is nervous/anxious.    As per HPI otherwise 10 point review of systems negative.   Past Medical History: Past Medical History:  Diagnosis Date  . Anemia   . Anxiety   . Costochondritis   . Depression   . Menorrhagia   . Migraine   . Tachycardia     Past Surgical History: Past Surgical History:  Procedure Laterality Date  . ADENOIDECTOMY    . DILATION AND EVACUATION N/A 07/23/2017   Procedure: DILATATION AND EVACUATION;  Surgeon: Linzie Collin, MD;  Location: [facility] ORS;  Service: Gynecology;  Laterality: N/A;  . TONSILLECTOMY       Allergies:  No Known Allergies   Social History:  reports that she has never smoked. She has never used smokeless tobacco. She reports that she does not drink alcohol or use drugs.   Family History: Family History  Problem Relation Age of Onset  . Hypertension Mother   . Arrhythmia Mother        palpitations  . Hyperlipidemia Mother   . Heart attack Maternal Uncle 42  . Heart attack Maternal Grandmother   . Heart disease Maternal Grandmother        CABG  . Stroke Maternal Grandmother   . Osteoarthritis Maternal Grandmother   . Hypertension Maternal Grandfather   . Stroke  Maternal Grandfather     Family history reviewed no family history of blood clots   Physical Exam: Vitals:   03/31/20 0148 03/31/20 0220 03/31/20 0300 03/31/20 0326  BP: 134/61 134/72  (!) 124/59  Pulse: 95 86 96 93  Resp: 20 20  20   Temp: 98.4 F (36.9 C) 98.1 F (36.7 C)  98.7 F (37.1 C)  TempSrc: Oral Oral  Oral  SpO2: 97% 99% 98% 99%  Weight:      Height:        Constitutional: Appearance,  Alert and awake, oriented x3, not in any acute distress. Eyes: PERLA, EOMI, irises appear normal, anicteric sclera,  ENMT: external ears and nose appear  normal, normal hearing            Lips appears normal, oropharynx mucosa, tongue, posterior pharynx appear normal  Neck: neck appears normal, no masses, normal ROM, no thyromegaly, no JVD  CVS: S1-S2 clear, no murmur rubs or gallops, no LE edema, normal pedal pulses  Respiratory:  clear to auscultation bilaterally, no wheezing, rales or rhonchi. Respiratory effort normal. No accessory muscle use.  Abdomen: soft nontender, nondistended, normal bowel sounds, no hepatosplenomegaly, no hernias  Musculoskeletal: : no cyanosis, clubbing or edema noted bilateral Neuro: Cranial nerves II-XII intact, strength, sensation, reflexes Psych: judgement and insight appear normal, stable mood and affect, mental status Skin: no rashes or lesions or ulcers, no induration or nodules    Data reviewed:  I have personally reviewed following labs and imaging studies Labs:  CBC: Recent Labs  Lab 03/29/20 0946 03/30/20 0033 03/31/20 0254  WBC 12.8* 14.1* 21.8*  NEUTROABS 8.4*  --   --   HGB 11.1* 10.8* 9.5*  HCT 32.5* 32.0* 28.1*  MCV 84.0 85.3 85.7  PLT 121* 128* 128*    Basic Metabolic Panel: No results for input(s): NA, K, CL, CO2, GLUCOSE, BUN, CREATININE, CALCIUM, MG, PHOS in the last 168 hours. GFR Estimated Creatinine Clearance: 102.2 mL/min (A) (by C-G formula based on SCr of 0.42 mg/dL (L)). Liver Function Tests: No results for input(s): AST, ALT, ALKPHOS, BILITOT, PROT, ALBUMIN in the last 168 hours. No results for input(s): LIPASE, AMYLASE in the last 168 hours. No results for input(s): AMMONIA in the last 168 hours. Coagulation profile No results for input(s): INR, PROTIME in the last 168 hours.  Cardiac Enzymes: No results for input(s): CKTOTAL, CKMB, CKMBINDEX, TROPONINI in the last 168 hours. BNP: Invalid input(s): POCBNP CBG: Recent Labs  Lab 03/31/20 0155  GLUCAP 130*   D-Dimer No results for input(s): DDIMER in the last 72 hours. Hgb A1c No results for input(s): HGBA1C in  the last 72 hours. Lipid Profile No results for input(s): CHOL, HDL, LDLCALC, TRIG, CHOLHDL, LDLDIRECT in the last 72 hours. Thyroid function studies No results for input(s): TSH, T4TOTAL, T3FREE, THYROIDAB in the last 72 hours.  Invalid input(s): FREET3 Anemia work up No results for input(s): VITAMINB12, FOLATE, FERRITIN, TIBC, IRON, RETICCTPCT in the last 72 hours. Urinalysis    Component Value Date/Time   COLORURINE YELLOW (A) 01/22/2020 1437   APPEARANCEUR HAZY (A) 01/22/2020 1437   APPEARANCEUR Cloudy (A) 08/17/2019 1043   LABSPEC 1.006 01/22/2020 1437   PHURINE 7.0 01/22/2020 1437   GLUCOSEU Negative 03/29/2020 0917   GLUCOSEU NEGATIVE 01/22/2020 1437   HGBUR NEGATIVE 01/22/2020 1437   BILIRUBINUR neg 03/29/2020 0917   BILIRUBINUR Negative 08/17/2019 1043   KETONESUR NEGATIVE 01/22/2020 1437   PROTEINUR NEGATIVE 01/22/2020 1437   UROBILINOGEN 0.2 03/29/2020 8938  NITRITE neg 03/29/2020 0917   NITRITE NEGATIVE 01/22/2020 1437   LEUKOCYTESUR Negative 03/29/2020 0917   LEUKOCYTESUR SMALL (A) 01/22/2020 1437     Microbiology Recent Results (from the past 240 hour(s))  SARS Coronavirus 2 by RT PCR (hospital order, performed in [facility] hospital lab) Nasopharyngeal Nasopharyngeal Swab     Status: None   Collection Time: 03/30/20 12:33 AM   Specimen: Nasopharyngeal Swab  Result Value Ref Range Status   SARS Coronavirus 2 NEGATIVE NEGATIVE Final    Comment: (NOTE) SARS-CoV-2 target nucleic acids are NOT DETECTED. The SARS-CoV-2 RNA is generally detectable in upper and lower respiratory specimens during the acute phase of infection. The lowest concentration of SARS-CoV-2 viral copies this assay can detect is 250 copies / mL. A negative result does not preclude SARS-CoV-2 infection and should not be used as the sole basis for treatment or other patient management decisions.  A negative result may occur with improper specimen collection / handling, submission of specimen  other than nasopharyngeal swab, presence of viral mutation(s) within the areas targeted by this assay, and inadequate number of viral copies (<250 copies / mL). A negative result must be combined with clinical observations, patient history, and epidemiological information. Fact Sheet for Patients:   BoilerBrush.com.cy Fact Sheet for Healthcare Providers: https://pope.com/ This test is not yet approved or cleared  by the [place] FDA and has been authorized for detection and/or diagnosis of SARS-CoV-2 by FDA under an Emergency Use Authorization (EUA).  This EUA will remain in effect (meaning this test can be used) for the duration of the COVID-19 declaration under Section 564(b)(1) of the Act, 21 U.S.C. section 360bbb-3(b)(1), unless the authorization is terminated or revoked sooner. Performed at [facility], [address] [place], [place] [address]        Inpatient Medications:   Scheduled Meds: . docusate sodium  100 mg Oral BID  . ferrous sulfate  325 mg Oral Q breakfast  . ibuprofen  600 mg Oral Q6H  . prenatal multivitamin  1 tablet Oral Q1200  . senna-docusate  2 tablet Oral Q24H   Continuous Infusions: . oxytocin 2.5 Units/hr (03/30/20 1710)     Radiological Exams on Admission: No results found.  Impression/Recommendations Active Problems:   Chest pain, in the setting of recent delivery and recent thrombocytopenia of uncertain etiology -Patient had 2 episodes of chest tightness, worse with deep inspiration, once during delivery and the next about 10 hours following vacuum-assisted delivery -Differential includes PE, although low suspicion given normal vitals -Patient's discomfort has completely resolved, vitals are within normal limits -Platelet count 128,000 -In the setting of complete resolution of symptoms, prior history of a similar episode some years of back albeit much less intense, and pain  somewhat atypical of PE, I am doubtful of life-threatening conditions such as acute PE.  Lower risk differentials include GERD and anxiety but continued monitoring for recurrent symptoms -Continued close monitoring with advice to nursing staff to contact us immediately if symptoms recur. -Did discuss differentials with Dr. Logan Bores earlier at the time of consultation    Vacuum-assisted vaginal delivery -Continued care per OB/GYN    History of thrombocytopenia -Patient was evaluated by hematologist, Dr. Donneta Romberg a week prior for platelet count of 78,000.  Treated empirically with steroids.  Differential per review of his note was gestational thrombocytopenia versus ITP -Platelet count currently at  128,000     Thank you for this consultation.  Our [facility] hospitalist team will follow the patient with  you.   Time Spent: 62  Andris Baumann M.D. [facility] 03/31/2020, 3:51 AM

## 2020-03-31 NOTE — Lactation Note (Signed)
This note was copied from a baby's chart. Lactation Consultation Note  Patient Name: Girl Oceania Noori [id number] Date: 03/31/2020  Baby to right breast first in football hold since least sore side. Baby gets frustrated quickly, pushing out nipple with hands and tongue. Rhythmic sucking with swallows once baby latches deeply. Approximately 10 minutes with assist on right side. Attempted cradle on left side but baby turned head and so changed to football with pillow support instead of boppy from home. Repeated attempts to latch. Mom used a nipple shield for the entirety of breastfeeding with son 3 years ago and has purchased nipple shields for home. 26mm shield used on left and baby nursed with swallows for 25-35 minutes with some relatching needed. Nipple open to air following feeding.     Maternal Data    Feeding Feeding Type: Breast Fed  LATCH Score Latch: Repeated attempts needed to sustain latch, nipple held in mouth throughout feeding, stimulation needed to elicit sucking reflex.  Audible Swallowing: Spontaneous and intermittent  Type of Nipple: Everted at rest and after stimulation  Comfort (Breast/Nipple): Filling, red/small blisters or bruises, mild/mod discomfort  Hold (Positioning): Assistance needed to correctly position infant at breast and maintain latch.  LATCH Score: 7  Interventions Interventions: Breast feeding basics reviewed;Assisted with latch;Adjust position;Support pillows;Position options  Lactation Tools Discussed/Used Tools: Comfort gels;Nipple Shields Nipple shield size: 24(mom purchased size 20 at home)   Consult Status      Matt Holmes 03/31/2020, 12:09 PM

## 2020-03-31 NOTE — Progress Notes (Signed)
Pt. Resting with eyes closed. Awakens easily for VS. States chest pain is gone, abdominal cramping is gone, lower back pain is a "4" from a "7" on the pain scale. Using Heating pad for lower back pain. EKG Print out states "Normal Sinus Rhythm." Pt. Is alert and oriented with quiet affect. Appears comfortable and in NAD. Will cont. To follow closely.

## 2020-03-31 NOTE — Progress Notes (Signed)
RN called and stated that patient was complaining of chest pain and tightness similar to earlier this evening that was worse. Her Vital signs are stable. BP 134/61 , HR 95, O2 stat 97% Bloods sugar 130. Hospitalist called for consult    [name], CNM

## 2020-03-31 NOTE — Progress Notes (Addendum)
Dr. Para March in to see Pt. Pt. Denies Chest pain and/or discomfort stating," I feel fine,now."

## 2020-03-31 NOTE — Progress Notes (Addendum)
Went to room to check on Pt. She states she was, "getting ready to call." Pt. With c/o mid-sternal chest pain   that she states she experienced during labor. Pt. Had an EKG at that time. Pt. Also c/o feeling, "shaky." Pt. Is alert and oriented with quiet affect. Color sl. Pale, skin w&d. BBS clear. VS: 98.4, 20, 134/61, 95 and regular. BBS clear. Positive pedal pulses equal and strong and cap. Refill < 2 seconds. Allegra Grana CNM notified and she states she is going to call Hospitalist for consult. Pager number provided. Pt. Placed on continues Pulse Ox. And will cont. To follow closely. Fundus is Firm at U/E with small amount of Lochia at this time. CBG was 130.

## 2020-03-31 NOTE — Progress Notes (Signed)
Noted order for Immediate EKG; therefor, I notified Cardiopulmonary of this order. Noted order for Troponin 1  STAT, I notified Lab of this STAT order.

## 2020-03-31 NOTE — Progress Notes (Signed)
[facility]  PROGRESS NOTE  Alexandra Joseph YTK:[id number] DOB: May 27, 1996 DOA: 03/30/2020 PCP: Alexandra Rud, MD Admit date - 03/30/2020   Admitting Physician Alexandra Joseph, CNM  Outpatient Primary MD for the patient is Alexandra Rud, MD  LOS - 1 Brief Narrative   Alexandra Joseph is a 24 y.o. year old female with medical history significant for female status post vacuum-assisted vaginal delivery, induced at 39 weeks due to thrombocytopenia of uncertain etiology(gestational thrombocytopenia versus ITP,per hematology.  [facility] hospital service was consulted on 5/15 for evaluation of chest pain.  Patient reports initial episode during delivery that subsided spontaneously.  Patient had a repeat episode that she describes as feeling like food was getting stuck in the middle of her chest, nonradiating, resolved with simethicone without any nausea, vomiting, diaphoresis or palpitations.  Has not had any repeat episodes since.  Does endorse some burping and reports acid reflux during pregnancy.  No cough, no fever, no chills, no changes in mild lower leg swelling that has been present throughout her pregnancy   Subjective  Today she feels well and has not had any repeat episodes of chest pain.  Reports some frequent burping.  A & P  Noncardiac chest pain.  Suspect GI etiology given resolution with simethicone.  Encouraged by normal EKG, unremarkable troponin, and no recurrent episodes -Agree with simethicone as needed -Agree with monitoring closely for any recurrent episodes -We will continue to follow while in hospital.    Thrombocytopenia.  Previous evaluated as an outpatient by hematologist Dr. Donneta Joseph ( on 03/22/20) with differential including gestational thrombocytopenia versus ITP able at 128 without any signs of symptoms of bleeding -Daily CBC  Normocytic anemia.  History of iron deficiency.  Hemoglobin stable -Continue oral iron -Daily CBC  Vacuum-assisted vaginal delivery  -Continue care per primary    Thank you for this consultation.  Our [facility] hospitalist team will follow the patient with you.   Lab Results  Component Value Date   PLT 129 (L) 03/31/2020    Diet :  Diet Order            Diet regular Room service appropriate? Yes; Fluid consistency: Thin  Diet effective now               Inpatient Medications Scheduled Meds: . docusate sodium  100 mg Oral BID  . ferrous sulfate  325 mg Oral Q breakfast  . ibuprofen  600 mg Oral Q6H  . prenatal multivitamin  1 tablet Oral Q1200  . senna-docusate  2 tablet Oral Q24H   Continuous Infusions: . oxytocin 2.5 Units/hr (03/30/20 1710)   PRN Meds:.acetaminophen, benzocaine-Menthol, coconut oil, witch hazel-glycerin **AND** dibucaine, methylergonovine **OR** methylergonovine, ondansetron **OR** ondansetron (ZOFRAN) IV, oxyCODONE-acetaminophen, oxyCODONE-acetaminophen, simethicone  Antibiotics  :   Anti-infectives (From admission, onward)   Start     Dose/Rate Route Frequency Ordered Stop   03/30/20 0415  penicillin G 3 million units in sodium chloride 0.9% 100 mL IVPB  Status:  Discontinued     3 Million Units 200 mL/hr over 30 Minutes Intravenous Every 4 hours 03/30/20 0012 03/30/20 1706   03/30/20 0015  penicillin G potassium 5 Million Units in sodium chloride 0.9 % 250 mL IVPB     5 Million Units 250 mL/hr over 60 Minutes Intravenous  Once 03/30/20 0012 03/30/20 0313       Objective   Vitals:   03/31/20 0600 03/31/20 0700 03/31/20 0731 03/31/20 1115  BP:   119/68 (!) 125/51  Pulse: 89  88 84 81  Resp:   20 18  Temp:   97.9 F (36.6 C) 97.7 F (36.5 C)  TempSrc:   Oral Oral  SpO2: 97% 97% 100%   Weight:      Height:        SpO2: 100 %  Wt Readings from Last 3 Encounters:  03/30/20 78.6 kg  03/29/20 78.6 kg  03/22/20 79.9 kg     Intake/Output Summary (Last 24 hours) at 03/31/2020 1648 Last data filed at 03/31/2020 0111 Gross per 24 hour  Intake 1392 ml  Output 1770 ml   Net -378 ml    Physical Exam:     Awake Alert, Oriented X 3, Normal affect No new F.N deficits, No chest wall tenderness [place].AT, Normal respiratory effort on room air, CTAB RRR,No Gallops,Rubs or new Murmurs,  +ve B.Sounds, Abd Soft, mild tenderness in suprapubic area, No rebound, guarding or rigidity. No Cyanosis, No new Rash or bruise     I have personally reviewed the following:   Data Reviewed:  CBC Recent Labs  Lab 03/29/20 0946 03/30/20 0033 03/31/20 0254 03/31/20 0449  WBC 12.8* 14.1* 21.8* 21.9*  HGB 11.1* 10.8* 9.5* 10.0*  HCT 32.5* 32.0* 28.1* 28.3*  PLT 121* 128* 128* 129*  MCV 84.0 85.3 85.7 82.7  MCH 28.7 28.8 29.0 29.2  MCHC 34.2 33.8 33.8 35.3  RDW 14.6 14.7 14.7 14.9  LYMPHSABS 2.7  --   --  3.4  MONOABS 1.2*  --   --  1.4*  EOSABS 0.1  --   --  0.1  BASOSABS 0.0  --   --  0.0    Chemistries  Recent Labs  Lab 03/31/20 0449  NA 139  K 3.6  CL 108  CO2 24  GLUCOSE 121*  BUN <5*  CREATININE 0.40*  CALCIUM 7.9*  AST 27  ALT 15  ALKPHOS 92  BILITOT 0.8   ------------------------------------------------------------------------------------------------------------------ No results for input(s): CHOL, HDL, LDLCALC, TRIG, CHOLHDL, LDLDIRECT in the last 72 hours.  No results found for: HGBA1C ------------------------------------------------------------------------------------------------------------------ No results for input(s): TSH, T4TOTAL, T3FREE, THYROIDAB in the last 72 hours.  Invalid input(s): FREET3 ------------------------------------------------------------------------------------------------------------------ No results for input(s): VITAMINB12, FOLATE, FERRITIN, TIBC, IRON, RETICCTPCT in the last 72 hours.  Coagulation profile No results for input(s): INR, PROTIME in the last 168 hours.  No results for input(s): DDIMER in the last 72 hours.  Cardiac Enzymes No results for input(s): CKMB, TROPONINI, MYOGLOBIN in the last  168 hours.  Invalid input(s): CK ------------------------------------------------------------------------------------------------------------------ No results found for: BNP  Micro Results Recent Results (from the past 240 hour(s))  SARS Coronavirus 2 by RT PCR (hospital order, performed in [facility] hospital lab) Nasopharyngeal Nasopharyngeal Swab     Status: None   Collection Time: 03/30/20 12:33 AM   Specimen: Nasopharyngeal Swab  Result Value Ref Range Status   SARS Coronavirus 2 NEGATIVE NEGATIVE Final    Comment: (NOTE) SARS-CoV-2 target nucleic acids are NOT DETECTED. The SARS-CoV-2 RNA is generally detectable in upper and lower respiratory specimens during the acute phase of infection. The lowest concentration of SARS-CoV-2 viral copies this assay can detect is 250 copies / mL. A negative result does not preclude SARS-CoV-2 infection and should not be used as the sole basis for treatment or other patient management decisions.  A negative result may occur with improper specimen collection / handling, submission of specimen other than nasopharyngeal swab, presence of viral mutation(s) within the areas targeted by this assay, and inadequate number of  viral copies (<250 copies / mL). A negative result must be combined with clinical observations, patient history, and epidemiological information. Fact Sheet for Patients:   BoilerBrush.com.cy Fact Sheet for Healthcare Providers: https://pope.com/ This test is not yet approved or cleared  by the [place] FDA and has been authorized for detection and/or diagnosis of SARS-CoV-2 by FDA under an Emergency Use Authorization (EUA).  This EUA will remain in effect (meaning this test can be used) for the duration of the COVID-19 declaration under Section 564(b)(1) of the Act, 21 U.S.C. section 360bbb-3(b)(1), unless the authorization is terminated or revoked sooner. Performed at  [facility], [address] [place], [place] [address]     Radiology Reports [place] Follow Up  Result Date: 03/19/2020 Patient Name: MEKAYLAH KLICH DOB: 21-May-1996 MRN: [id number] ULTRASOUND REPORT Location: [facility] Date of Service: 03/13/2020 Indications:growth/afi Findings: Mason Jim intrauterine pregnancy is visualized with FHR at 152 BPM. Biometrics give an (U/S) Gestational age of [redacted]w[redacted]d and an (U/S) EDD of 03/29/2020; this correlates with the clinically established Estimated Date of Delivery: 04/01/20. Fetal presentation is Cephalic. Placenta: posterior. Grade: 2 AFI: 8.9 cm Growth percentile is 57. EFW: 3235 g ( 7 lbs 2 oz) Impression: 1. [redacted]w[redacted]d Viable [place] Intrauterine pregnancy previously established criteria. 2. Growth is 57 %ile.  AFI is 8.9 cm. Recommendations: 1.Clinical correlation with the patient's History and Physical Exam. Jenine  M. Marciano Sequin     RDMS The ultrasound images and findings were reviewed by me and I agree with the above report. Elonda Husky, M.D. 03/19/2020 11:44 AM    Time Spent in minutes  30     Laverna Peace M.D on 03/31/2020 at 4:48 PM  To page go to [email or web address] - password [email or web address]

## 2020-03-31 NOTE — Progress Notes (Signed)
Progress Note - Vaginal Delivery  Alexandra Joseph is a 24 y.o. G2P1001 now PP day 1 s/p Vaginal, Vacuum (Extractor) .   Subjective:  The patient reports no complaints, up ad lib, voiding, tolerating PO, + flatus and denies any other episodes of chest pain/tightness. States she feels much better today.    Objective:  Vital signs in last 24 hours: Temp:  [97.6 F (36.4 C)-99.6 F (37.6 C)] 97.9 F (36.6 C) (05/16 0731) Pulse Rate:  [74-119] 84 (05/16 0731) Resp:  [14-20] 20 (05/16 0731) BP: (110-143)/(43-84) 119/68 (05/16 0731) SpO2:  [88 %-100 %] 100 % (05/16 0731)  Physical Exam:  General: alert, cooperative, appears stated age and no distress  Heart: RRR Lungs clear bilaterally Lochia: appropriate Uterine Fundus: firm    Data Review Recent Labs    03/31/20 0254 03/31/20 0449  HGB 9.5* 10.0*  HCT 28.1* 28.3*    Assessment/Plan: Active Problems:   Chest pain     Overview: Overview:      Last Assessment & Plan:      Sometimes has chest pain at rest not associated with tachycardia. Unable      to exclude spasm.     Blood pressure running low, we'll hold off on nitrates at this time.     Could consider calcium channel blockers for vasospasm and tachycardia     We'll have her meet with EP for further discussion   Labor and delivery, indication for care   Vacuum-assisted vaginal delivery   History of thrombocytopenia   Plan for discharge tomorrow   -- Continue routine PP care.     [name], CNM  03/31/2020 8:36 AM

## 2020-04-01 DIAGNOSIS — R079 Chest pain, unspecified: Secondary | ICD-10-CM

## 2020-04-01 LAB — PTT FACTOR INHIBITOR (MIXING STUDY): aPTT: 27.2 s (ref 22.9–30.2)

## 2020-04-01 MED ORDER — SIMETHICONE 80 MG PO CHEW: 80.0000 mg | CHEWABLE_TABLET | ORAL | 0 refills | Status: DC | PRN

## 2020-04-01 MED ORDER — DOCUSATE SODIUM 100 MG PO CAPS: 100.0000 mg | ORAL_CAPSULE | Freq: Two times a day (BID) | ORAL | 0 refills | Status: DC

## 2020-04-01 MED ORDER — NORETHINDRONE 0.35 MG PO TABS
1.0000 | ORAL_TABLET | Freq: Every day | ORAL | 11 refills | Status: DC
Start: 2020-04-01 — End: 2020-05-13

## 2020-04-01 MED ORDER — IBUPROFEN 600 MG PO TABS: 600.0000 mg | ORAL_TABLET | Freq: Four times a day (QID) | ORAL | 0 refills | Status: DC

## 2020-04-01 NOTE — Final Progress Note (Signed)
Discharge Day SOAP Note:  Progress Note - Vaginal Delivery  Alexandra Joseph is a 24 y.o. G2P1001 now PP day 2 s/p Vaginal, Vacuum (Extractor) . Delivery was uncomplicated  Subjective  The patient has the following complaints: has no unusual complaints  Pain is controlled with current medications.   Patient is urinating without difficulty.  She is ambulating well.     Objective  Vital signs: BP 116/71 (BP Location: Left Arm)   Pulse 73   Temp 97.6 F (36.4 C) (Oral)   Resp 16   Ht 5' (1.524 m)   Wt 78.6 kg   LMP 06/24/2019   SpO2 100%   BMI 33.85 kg/m   Physical Exam: Gen: NAD Fundus Fundal Tone: Firm  Lochia Amount: Small        Data Review Labs: Lab Results  Component Value Date   WBC 21.9 (H) 03/31/2020   HGB 10.0 (L) 03/31/2020   HCT 28.3 (L) 03/31/2020   MCV 82.7 03/31/2020   PLT 129 (L) 03/31/2020   CBC Latest Ref Rng & Units 03/31/2020 03/31/2020 03/30/2020  WBC 4.0 - 10.5 K/uL 21.9(H) 21.8(H) 14.1(H)  Hemoglobin 12.0 - 15.0 g/dL 10.0(L) 9.5(L) 10.8(L)  Hematocrit 36.0 - 46.0 % 28.3(L) 28.1(L) 32.0(L)  Platelets 150 - 400 K/uL 129(L) 128(L) 128(L)   A POS  Edinburgh Score: Edinburgh Postnatal Depression Scale Screening Tool 03/31/2020  I have been able to laugh and see the funny side of things. 0  I have looked forward with enjoyment to things. 0  I have blamed myself unnecessarily when things went wrong. 0  I have been anxious or worried for no good reason. 1  I have felt scared or panicky for no good reason. 0  Things have been getting on top of me. 0  I have been so unhappy that I have had difficulty sleeping. 0  I have felt sad or miserable. 0  I have been so unhappy that I have been crying. 0  The thought of harming myself has occurred to me. 0  Edinburgh Postnatal Depression Scale Total 1    Assessment/Plan  Active Problems:   Chest pain     Overview: Overview:      Last Assessment & Plan:      Sometimes has chest pain at rest  not associated with tachycardia. Unable      to exclude spasm.     Blood pressure running low, we'll hold off on nitrates at this time.     Could consider calcium channel blockers for vasospasm and tachycardia     We'll have her meet with EP for further discussion   Labor and delivery, indication for care   Vacuum-assisted vaginal delivery   History of thrombocytopenia    Plan for discharge today.  Discharge Instructions: Per After Visit Summary. Activity: Advance as tolerated. Pelvic rest for 6 weeks.  Also refer to After Visit Summary Diet: Regular Medications: Allergies as of 04/01/2020   No Known Allergies     Medication List    TAKE these medications   docusate sodium 100 MG capsule Commonly known as: COLACE Take 1 capsule (100 mg total) by mouth 2 (two) times daily.   Fusion Plus Caps Take 1 tablet by mouth daily.   ibuprofen 600 MG tablet Commonly known as: ADVIL Take 1 tablet (600 mg total) by mouth every 6 (six) hours.   norethindrone 0.35 MG tablet Commonly known as: MICRONOR Take 1 tablet (0.35 mg total) by mouth daily.  prenatal multivitamin Tabs tablet Take 1 tablet by mouth daily at 12 noon.   simethicone 80 MG chewable tablet Commonly known as: MYLICON Chew 1 tablet (80 mg total) by mouth as needed for flatulence.      Outpatient follow up: 2 wks with televisit, 6 wk postpartum  Postpartum contraception: Will discuss at first office visit post-partum  Discharged Condition: good  Discharged to: home  Newborn Data: Disposition:home with mother  Apgars: APGAR (1 MIN): 7   APGAR (5 MINS): 9   APGAR (10 MINS):    Baby Feeding: Breast    Philip Aspen, CNM  04/01/2020 10:24 AM

## 2020-04-01 NOTE — Progress Notes (Signed)
[facility]  PROGRESS NOTE  Alexandra Joseph UMP:[id number] DOB: August 12, 1996 DOA: 03/30/2020 PCP: Derinda Late, MD Admit date - 03/30/2020   Admitting Physician Philip Aspen, CNM  Outpatient Primary MD for the patient is Derinda Late, MD  LOS - 2 Brief Narrative   Alexandra Joseph is a 24 y.o. year old female with medical history significant for female status post vacuum-assisted vaginal delivery, induced at 39 weeks due to thrombocytopenia of uncertain etiology(gestational thrombocytopenia versus ITP,per hematology.  [facility] hospital service was consulted on 5/15 for evaluation of chest pain.  Patient reports initial episode during delivery that subsided spontaneously.  Patient had a repeat episode that she describes as feeling like food was getting stuck in the middle of her chest, nonradiating, resolved with simethicone without any nausea, vomiting, diaphoresis or palpitations.  Has not had any repeat episodes since.  Does endorse some burping and reports acid reflux during pregnancy.  No cough, no fever, no chills, no changes in mild lower leg swelling that has been present throughout her pregnancy   Subjective  Today denies any repeat episodes of chest pain.  Feels well  A & P  Noncardiac chest pain.  Suspect GI etiology given resolution with simethicone.  Encouraged by normal EKG, unremarkable troponin, and no recurrent episodes -Agree with simethicone as needed   Thrombocytopenia.  Previous evaluated as an outpatient by hematologist Dr. Rogue Bussing ( on 03/22/20) with differential including gestational thrombocytopenia versus ITP able at 128 without any signs of symptoms of bleeding   Normocytic anemia.  History of iron deficiency.  Hemoglobin stable -Continue oral iron   Vacuum-assisted vaginal delivery -Continue care per primary    Thank you for this consultation.  .   Lab Results  Component Value Date   PLT 129 (L) 03/31/2020    Diet :  Diet Order    None        Inpatient Medications Scheduled Meds:  Continuous Infusions:  PRN Meds:.  Antibiotics  :   Anti-infectives (From admission, onward)   Start     Dose/Rate Route Frequency Ordered Stop   03/30/20 0415  penicillin G 3 million units in sodium chloride 0.9% 100 mL IVPB  Status:  Discontinued     3 Million Units 200 mL/hr over 30 Minutes Intravenous Every 4 hours 03/30/20 0012 03/30/20 1706   03/30/20 0015  penicillin G potassium 5 Million Units in sodium chloride 0.9 % 250 mL IVPB     5 Million Units 250 mL/hr over 60 Minutes Intravenous  Once 03/30/20 0012 03/30/20 0313       Objective   Vitals:   03/31/20 1953 04/01/20 0022 04/01/20 0412 04/01/20 0804  BP: 121/63 128/65 (!) 126/56 116/71  Pulse: 73 89 81 73  Resp: 18 18 18 16   Temp: 98.2 F (36.8 C) 98 F (36.7 C) 98.1 F (36.7 C) 97.6 F (36.4 C)  TempSrc: Oral Oral Oral Oral  SpO2: 100% 97% 99% 100%  Weight:      Height:        SpO2: 100 %  Wt Readings from Last 3 Encounters:  03/30/20 78.6 kg  03/29/20 78.6 kg  03/22/20 79.9 kg    No intake or output data in the 24 hours ending 04/01/20 1927  Physical Exam:     Awake Alert, Oriented X 3, Normal affect No new F.N deficits, No chest wall tenderness [place].AT, Normal respiratory effort on room air, CTAB RRR,No Gallops,Rubs or new Murmurs,  +ve B.Sounds, Abd Soft, mild tenderness  in suprapubic area, No rebound, guarding or rigidity. No Cyanosis, No new Rash or bruise     I have personally reviewed the following:   Data Reviewed:  CBC Recent Labs  Lab 03/29/20 0946 03/30/20 0033 03/31/20 0254 03/31/20 0449  WBC 12.8* 14.1* 21.8* 21.9*  HGB 11.1* 10.8* 9.5* 10.0*  HCT 32.5* 32.0* 28.1* 28.3*  PLT 121* 128* 128* 129*  MCV 84.0 85.3 85.7 82.7  MCH 28.7 28.8 29.0 29.2  MCHC 34.2 33.8 33.8 35.3  RDW 14.6 14.7 14.7 14.9  LYMPHSABS 2.7  --   --  3.4  MONOABS 1.2*  --   --  1.4*  EOSABS 0.1  --   --  0.1  BASOSABS 0.0  --   --  0.0     Chemistries  Recent Labs  Lab 03/31/20 0449  NA 139  K 3.6  CL 108  CO2 24  GLUCOSE 121*  BUN <5*  CREATININE 0.40*  CALCIUM 7.9*  AST 27  ALT 15  ALKPHOS 92  BILITOT 0.8   ------------------------------------------------------------------------------------------------------------------ No results for input(s): CHOL, HDL, LDLCALC, TRIG, CHOLHDL, LDLDIRECT in the last 72 hours.  No results found for: HGBA1C ------------------------------------------------------------------------------------------------------------------ No results for input(s): TSH, T4TOTAL, T3FREE, THYROIDAB in the last 72 hours.  Invalid input(s): FREET3 ------------------------------------------------------------------------------------------------------------------ No results for input(s): VITAMINB12, FOLATE, FERRITIN, TIBC, IRON, RETICCTPCT in the last 72 hours.  Coagulation profile No results for input(s): INR, PROTIME in the last 168 hours.  No results for input(s): DDIMER in the last 72 hours.  Cardiac Enzymes No results for input(s): CKMB, TROPONINI, MYOGLOBIN in the last 168 hours.  Invalid input(s): CK ------------------------------------------------------------------------------------------------------------------ No results found for: BNP  Micro Results Recent Results (from the past 240 hour(s))  SARS Coronavirus 2 by RT PCR (hospital order, performed in [facility] hospital lab) Nasopharyngeal Nasopharyngeal Swab     Status: None   Collection Time: 03/30/20 12:33 AM   Specimen: Nasopharyngeal Swab  Result Value Ref Range Status   SARS Coronavirus 2 NEGATIVE NEGATIVE Final    Comment: (NOTE) SARS-CoV-2 target nucleic acids are NOT DETECTED. The SARS-CoV-2 RNA is generally detectable in upper and lower respiratory specimens during the acute phase of infection. The lowest concentration of SARS-CoV-2 viral copies this assay can detect is 250 copies / mL. A negative result does not  preclude SARS-CoV-2 infection and should not be used as the sole basis for treatment or other patient management decisions.  A negative result may occur with improper specimen collection / handling, submission of specimen other than nasopharyngeal swab, presence of viral mutation(s) within the areas targeted by this assay, and inadequate number of viral copies (<250 copies / mL). A negative result must be combined with clinical observations, patient history, and epidemiological information. Fact Sheet for Patients:   BoilerBrush.com.cy Fact Sheet for Healthcare Providers: https://pope.com/ This test is not yet approved or cleared  by the [place] FDA and has been authorized for detection and/or diagnosis of SARS-CoV-2 by FDA under an Emergency Use Authorization (EUA).  This EUA will remain in effect (meaning this test can be used) for the duration of the COVID-19 declaration under Section 564(b)(1) of the Act, 21 U.S.C. section 360bbb-3(b)(1), unless the authorization is terminated or revoked sooner. Performed at [facility], [address] [place], [place] [address]     Radiology Reports [place] Follow Up  Result Date: 03/19/2020 Patient Name: SAMEEHA ROCKEFELLER DOB: 12-10-1995 MRN: [id number] ULTRASOUND REPORT Location: [facility] Date of Service: 03/13/2020 Indications:growth/afi  Findings: Singleton intrauterine pregnancy is visualized with FHR at 152 BPM. Biometrics give an (U/S) Gestational age of [redacted]w[redacted]d and an (U/S) EDD of 03/29/2020; this correlates with the clinically established Estimated Date of Delivery: 04/01/20. Fetal presentation is Cephalic. Placenta: posterior. Grade: 2 AFI: 8.9 cm Growth percentile is 57. EFW: 3235 g ( 7 lbs 2 oz) Impression: 1. [redacted]w[redacted]d Viable Singleton Intrauterine pregnancy previously established criteria. 2. Growth is 57 %ile.  AFI is 8.9 cm. Recommendations: 1.Clinical correlation with the  patient's History and Physical Exam. Jenine  M. Marciano Sequin     RDMS The ultrasound images and findings were reviewed by me and I agree with the above report. Elonda Husky, M.D. 03/19/2020 11:44 AM    Time Spent in minutes  30     Laverna Peace M.D on 04/01/2020 at 7:27 PM  To page go to [email or web address] - password [facility]

## 2020-04-01 NOTE — Lactation Note (Signed)
This note was copied from a baby's chart. Lactation Consultation Note  Patient Name: Alexandra Joseph Date: 04/01/2020     Maternal Data    Feeding    LATCH Score                   Interventions  Mom reports consistent latching without a nipple shield through the night and milk transitioning. No assistance requested. Reports mastitis with previous child. Tips offered to resolve plugged ducts early to avoid progression to mastitis. Follow up support for lactation services reviewed.   Lactation Tools Discussed/Used     Consult Status  completed    Matt Holmes 04/01/2020, 9:22 AM

## 2020-04-01 NOTE — Discharge Summary (Signed)
Patient Name: Alexandra Joseph DOB: [birth year] MRN: [id number]                            Discharge Summary  Date of Admission: 03/30/2020 Date of Discharge: 04/01/2020 Delivering Provider: Philip Aspen   Admitting Diagnosis: Labor and delivery, indication for care [O75.9] at [redacted]w[redacted]d Secondary diagnosis:  Active Problems:   Chest pain   Labor and delivery, indication for care   Vacuum-assisted vaginal delivery   History of thrombocytopenia   Mode of Delivery: vacuum-assisted vaginal delivery              Discharge diagnosis: Term Pregnancy Delivered      Intrapartum Procedures: epidural and laceration 1st   Post partum procedures: consult for chest pain  Complications: 1st degree perineal laceration                     Discharge Day SOAP Note:  Progress Note - Vaginal Delivery  Alexandra Joseph is a 24 y.o. G2P1001 now PP day 2 s/p Vaginal, Vacuum (Extractor) . Delivery was uncomplicated  Subjective  The patient has the following complaints: has no unusual complaints  Pain is controlled with current medications.   Patient is urinating without difficulty.  She is ambulating well.     Objective  Vital signs: BP 116/71 (BP Location: Left Arm)   Pulse 73   Temp 97.6 F (36.4 C) (Oral)   Resp 16   Ht 5' (1.524 m)   Wt 78.6 kg   LMP 06/24/2019   SpO2 100%   BMI 33.85 kg/m   Physical Exam: Gen: NAD Fundus Fundal Tone: Firm  Lochia Amount: Small        Data Review Labs: Lab Results  Component Value Date   WBC 21.9 (H) 03/31/2020   HGB 10.0 (L) 03/31/2020   HCT 28.3 (L) 03/31/2020   MCV 82.7 03/31/2020   PLT 129 (L) 03/31/2020   CBC Latest Ref Rng & Units 03/31/2020 03/31/2020 03/30/2020  WBC 4.0 - 10.5 K/uL 21.9(H) 21.8(H) 14.1(H)  Hemoglobin 12.0 - 15.0 g/dL 10.0(L) 9.5(L) 10.8(L)  Hematocrit 36.0 - 46.0 % 28.3(L) 28.1(L) 32.0(L)  Platelets 150 - 400 K/uL 129(L) 128(L) 128(L)   A POS  Edinburgh Score: Edinburgh Postnatal Depression Scale  Screening Tool 03/31/2020  I have been able to laugh and see the funny side of things. 0  I have looked forward with enjoyment to things. 0  I have blamed myself unnecessarily when things went wrong. 0  I have been anxious or worried for no good reason. 1  I have felt scared or panicky for no good reason. 0  Things have been getting on top of me. 0  I have been so unhappy that I have had difficulty sleeping. 0  I have felt sad or miserable. 0  I have been so unhappy that I have been crying. 0  The thought of harming myself has occurred to me. 0  Edinburgh Postnatal Depression Scale Total 1    Assessment/Plan  Active Problems:   Chest pain     Overview: Overview:      Last Assessment & Plan:      Sometimes has chest pain at rest not associated with tachycardia. Unable      to exclude spasm.     Blood pressure running low, we'll hold off on nitrates at this time.     Could consider calcium channel  blockers for vasospasm and tachycardia     We'll have her meet with EP for further discussion   Labor and delivery, indication for care   Vacuum-assisted vaginal delivery   History of thrombocytopenia    Plan for discharge today.  Discharge Instructions: Per After Visit Summary. Activity: Advance as tolerated. Pelvic rest for 6 weeks.  Also refer to After Visit Summary Diet: Regular Medications: Allergies as of 04/01/2020   No Known Allergies     Medication List    TAKE these medications   docusate sodium 100 MG capsule Commonly known as: COLACE Take 1 capsule (100 mg total) by mouth 2 (two) times daily.   Fusion Plus Caps Take 1 tablet by mouth daily.   ibuprofen 600 MG tablet Commonly known as: ADVIL Take 1 tablet (600 mg total) by mouth every 6 (six) hours.   norethindrone 0.35 MG tablet Commonly known as: MICRONOR Take 1 tablet (0.35 mg total) by mouth daily.   prenatal multivitamin Tabs tablet Take 1 tablet by mouth daily at 12 noon.   simethicone 80 MG chewable  tablet Commonly known as: MYLICON Chew 1 tablet (80 mg total) by mouth as needed for flatulence.      Outpatient follow up: 2 wks with tele visit, 6 wk postpartum  Postpartum contraception: Will discuss at first office visit post-partum  Discharged Condition: good  Discharged to: home  Newborn Data: Disposition:home with mother  Apgars: APGAR (1 MIN): 7   APGAR (5 MINS): 9   APGAR (10 MINS):    Baby Feeding: Breast    [name], CNM  04/01/2020 10:24 AM

## 2020-04-01 NOTE — Progress Notes (Signed)
DC to home with NB.  To car via [place] with auxillary

## 2020-04-01 NOTE — Progress Notes (Signed)
DC instructions reviewed with pt.  Verb u/o and how to take medications at home and of f/u appointments

## 2020-04-04 ENCOUNTER — Other Ambulatory Visit: Payer: Self-pay

## 2020-04-04 ENCOUNTER — Ambulatory Visit (INDEPENDENT_AMBULATORY_CARE_PROVIDER_SITE_OTHER): Payer: BC Managed Care – PPO | Admitting: Certified Nurse Midwife

## 2020-04-04 ENCOUNTER — Encounter: Payer: Self-pay | Admitting: Certified Nurse Midwife

## 2020-04-04 DIAGNOSIS — Z8759 Personal history of other complications of pregnancy, childbirth and the puerperium: Secondary | ICD-10-CM | POA: Diagnosis not present

## 2020-04-04 DIAGNOSIS — O99119 Other diseases of the blood and blood-forming organs and certain disorders involving the immune mechanism complicating pregnancy, unspecified trimester: Secondary | ICD-10-CM | POA: Diagnosis not present

## 2020-04-04 DIAGNOSIS — D696 Thrombocytopenia, unspecified: Secondary | ICD-10-CM | POA: Diagnosis not present

## 2020-04-04 MED ORDER — TRANEXAMIC ACID 650 MG PO TABS: 1300.0000 mg | ORAL_TABLET | Freq: Three times a day (TID) | ORAL | 0 refills | Status: AC

## 2020-04-04 NOTE — Patient Instructions (Addendum)
Postpartum Care After Vaginal Delivery This sheet gives you information about how to care for yourself from the time you deliver your baby to up to 6-12 weeks after delivery (postpartum period). Your health care provider may also give you more specific instructions. If you have problems or questions, contact your health care provider. Follow these instructions at home: Vaginal bleeding  It is normal to have vaginal bleeding (lochia) after delivery. Wear a sanitary pad for vaginal bleeding and discharge. ? During the first week after delivery, the amount and appearance of lochia is often similar to a menstrual period. ? Over the next few weeks, it will gradually decrease to a dry, yellow-brown discharge. ? For most women, lochia stops completely by 4-6 weeks after delivery. Vaginal bleeding can vary from woman to woman.  Change your sanitary pads frequently. Watch for any changes in your flow, such as: ? A sudden increase in volume. ? A change in color. ? Large blood clots.  If you pass a blood clot from your vagina, save it and call your health care provider to discuss. Do not flush blood clots down the toilet before talking with your health care provider.  Do not use tampons or douches until your health care provider says this is safe.  If you are not breastfeeding, your period should return 6-8 weeks after delivery. If you are feeding your child breast milk only (exclusive breastfeeding), your period may not return until you stop breastfeeding. Perineal care  Keep the area between the vagina and the anus (perineum) clean and dry as told by your health care provider. Use medicated pads and pain-relieving sprays and creams as directed.  If you had a cut in the perineum (episiotomy) or a tear in the vagina, check the area for signs of infection until you are healed. Check for: ? More redness, swelling, or pain. ? Fluid or blood coming from the cut or tear. ? Warmth. ? Pus or a bad  smell.  You may be given a squirt bottle to use instead of wiping to clean the perineum area after you go to the bathroom. As you start healing, you may use the squirt bottle before wiping yourself. Make sure to wipe gently.  To relieve pain caused by an episiotomy, a tear in the vagina, or swollen veins in the anus (hemorrhoids), try taking a warm sitz bath 2-3 times a day. A sitz bath is a warm water bath that is taken while you are sitting down. The water should only come up to your hips and should cover your buttocks. Breast care  Within the first few days after delivery, your breasts may feel heavy, full, and uncomfortable (breast engorgement). Milk may also leak from your breasts. Your health care provider can suggest ways to help relieve the discomfort. Breast engorgement should go away within a few days.  If you are breastfeeding: ? Wear a bra that supports your breasts and fits you well. ? Keep your nipples clean and dry. Apply creams and ointments as told by your health care provider. ? You may need to use breast pads to absorb milk that leaks from your breasts. ? You may have uterine contractions every time you breastfeed for up to several weeks after delivery. Uterine contractions help your uterus return to its normal size. ? If you have any problems with breastfeeding, work with your health care provider or lactation consultant.  If you are not breastfeeding: ? Avoid touching your breasts a lot. Doing this can make   your breasts produce more milk. ? Wear a good-fitting bra and use cold packs to help with swelling. ? Do not squeeze out (express) milk. This causes you to make more milk. Intimacy and sexuality  Ask your health care provider when you can engage in sexual activity. This may depend on: ? Your risk of infection. ? How fast you are healing. ? Your comfort and desire to engage in sexual activity.  You are able to get pregnant after delivery, even if you have not had  your period. If desired, talk with your health care provider about methods of birth control (contraception). Medicines  Take over-the-counter and prescription medicines only as told by your health care provider.  If you were prescribed an antibiotic medicine, take it as told by your health care provider. Do not stop taking the antibiotic even if you start to feel better. Activity  Gradually return to your normal activities as told by your health care provider. Ask your health care provider what activities are safe for you.  Rest as much as possible. Try to rest or take a nap while your baby is sleeping. Eating and drinking   Drink enough fluid to keep your urine pale yellow.  Eat high-fiber foods every day. These may help prevent or relieve constipation. High-fiber foods include: ? Whole grain cereals and breads. ? Brown rice. ? Beans. ? Fresh fruits and vegetables.  Do not try to lose weight quickly by cutting back on calories.  Take your prenatal vitamins until your postpartum checkup or until your health care provider tells you it is okay to stop. Lifestyle  Do not use any products that contain nicotine or tobacco, such as cigarettes and e-cigarettes. If you need help quitting, ask your health care provider.  Do not drink alcohol, especially if you are breastfeeding. General instructions  Keep all follow-up visits for you and your baby as told by your health care provider. Most women visit their health care provider for a postpartum checkup within the first 3-6 weeks after delivery. Contact a health care provider if:  You feel unable to cope with the changes that your child brings to your life, and these feelings do not go away.  You feel unusually sad or worried.  Your breasts become red, painful, or hard.  You have a fever.  You have trouble holding urine or keeping urine from leaking.  You have little or no interest in activities you used to enjoy.  You have not  breastfed at all and you have not had a menstrual period for 12 weeks after delivery.  You have stopped breastfeeding and you have not had a menstrual period for 12 weeks after you stopped breastfeeding.  You have questions about caring for yourself or your baby.  You pass a blood clot from your vagina. Get help right away if:  You have chest pain.  You have difficulty breathing.  You have sudden, severe leg pain.  You have severe pain or cramping in your lower abdomen.  You bleed from your vagina so much that you fill more than one sanitary pad in one hour. Bleeding should not be heavier than your heaviest period.  You develop a severe headache.  You faint.  You have blurred vision or spots in your vision.  You have bad-smelling vaginal discharge.  You have thoughts about hurting yourself or your baby. If you ever feel like you may hurt yourself or others, or have thoughts about taking your own life, get help  right away. You can go to the nearest emergency department or call:  Your local emergency services (911 in the [place]  A suicide crisis helpline, such as the [facility] at 317-524-9866. This is open 24 hours a day. Summary  The period of time right after you deliver your newborn up to 6-12 weeks after delivery is called the postpartum period.  Gradually return to your normal activities as told by your health care provider.  Keep all follow-up visits for you and your baby as told by your health care provider. This information is not intended to replace advice given to you by your health care provider. Make sure you discuss any questions you have with your health care provider. Document Revised: 11/05/2017 Document Reviewed: 08/16/2017 Elsevier Patient Education  [place].   Tranexamic acid oral tablets What is this medicine? TRANEXAMIC ACID (TRAN ex AM ik AS id) slows down or stops blood clots from being broken down. This  medicine is used to treat heavy monthly menstrual bleeding. This medicine may be used for other purposes; ask your health care provider or pharmacist if you have questions. COMMON BRAND NAME(S): Cyklokapron, Lysteda What should I tell my health care provider before I take this medicine? They need to know if you have any of these conditions:  bleeding in the brain  blood clotting problems  kidney disease  vision problems  an unusual allergic reaction to tranexamic acid, other medicines, foods, dyes, or preservatives  pregnant or trying to get pregnant  breast-feeding How should I use this medicine? Take this medicine by mouth with a glass of water. Follow the directions on the prescription label. Do not cut, crush, or chew this medicine. You can take it with or without food. If it upsets your stomach, take it with food. Take your medicine at regular intervals. Do not take it more often than directed. Do not stop taking except on your doctor's advice. Do not take this medicine until your period has started. Do not take it for more than 5 days in a row. Do not take this medicine when you do not have your period. Talk to your pediatrician regarding the use of this medicine in children. While this drug may be prescribed for female children as young as 39 years of age for selected conditions, precautions do apply. Overdosage: If you think you have taken too much of this medicine contact a poison control center or emergency room at once. NOTE: This medicine is only for you. Do not share this medicine with others. What if I miss a dose? If you miss a dose, take it when you remember, and then take your next dose at least 6 hours later. Do not take more than 2 tablets at a time to make up for missed doses. What may interact with this medicine? Do not take this medicine with any of the following medications:  estrogens  birth control pills, patches, injections, rings or other devices that  contain both an estrogen and a progestin This medicine may also interact with the following medications:  certain medicines used to help your blood clot  tretinoin (taken by mouth) This list may not describe all possible interactions. Give your health care provider a list of all the medicines, herbs, non-prescription drugs, or dietary supplements you use. Also tell them if you smoke, drink alcohol, or use illegal drugs. Some items may interact with your medicine. What should I watch for while using this medicine? Tell your doctor  or healthcare professional if your symptoms do not start to get better or if they get worse. Tell your doctor or healthcare professional if you notice any eye problems while taking this medicine. Your doctor will refer you to an eye doctor who will examine your eyes. What side effects may I notice from receiving this medicine? Side effects that you should report to your doctor or health care professional as soon as possible:  allergic reactions like skin rash, itching or hives, swelling of the face, lips, or tongue  breathing difficulties  changes in vision  sudden or severe pain in the chest, legs, head, or groin  unusually weak or tired Side effects that usually do not require medical attention (report to your doctor or health care professional if they continue or are bothersome):  back pain  headache  muscle or joint aches  sinus and nasal problems  stomach pain  tiredness This list may not describe all possible side effects. Call your doctor for medical advice about side effects. You may report side effects to FDA at 1-800-FDA-1088. Where should I keep my medicine? Keep out of the reach of children. Store at room temperature between 15 and 30 degrees C (59 and 86 degrees F). Throw away any unused medicine after the expiration date. NOTE: This sheet is a summary. It may not cover all possible information. If you have questions about this medicine,  talk to your doctor, pharmacist, or health care provider.  2020 Elsevier/Gold Standard (2015-12-05 09:12:15)

## 2020-04-04 NOTE — Progress Notes (Signed)
GYN ENCOUNTER NOTE  Subjective:       Alexandra Joseph is a 24 y.o. [phone] female is here for gynecologic evaluation of the following issues:  1. Passing blood clots 2. Abdominal cramping  Five (5) days postpartum, yesterday she had two (2) episodes of passing large clots. Notes one (1) clot was the size of her palm and the other was approximately the size of a golf ball.   Reports abdominal cramps last night with low grade temperature of 99.3 F at home. Continues to breast feed every four (4) hours or sooner.   Denies difficulty breathing or respiratory distress, chest pain, abdominal pain, excessive vaginal bleeding, dysuria, leg pain or swelling     Gynecologic History  Patient's last menstrual period was 06/24/2019.   Contraception: abstinence, lactating mother  Last Pap: 09/13/2019. Results were: normal  Obstetric History  OB History  Gravida Para Term Preterm AB Living  2 1 1     1   SAB TAB Ectopic Multiple Live Births          1    # Outcome Date GA Lbr Len/2nd Weight Sex Delivery Anes PTL Lv  2 Current           1 Term 07/23/17 [redacted]w[redacted]d  7 lb 10 oz (3.459 kg) M  EPI N LIV     Complications: Other Excessive Bleeding    Obstetric Comments  Retained placenta and D&C 1 day PP, anemia resulted with blood transfused.    Past Medical History:  Diagnosis Date  . Anemia   . Anxiety   . Costochondritis   . Depression   . Menorrhagia   . Migraine   . Tachycardia     Past Surgical History:  Procedure Laterality Date  . ADENOIDECTOMY    . DILATION AND EVACUATION N/A 07/23/2017   Procedure: DILATATION AND EVACUATION;  Surgeon: Harlin Heys, MD;  Location: [facility] ORS;  Service: Gynecology;  Laterality: N/A;  . TONSILLECTOMY      Current Outpatient Medications on File Prior to Visit  Medication Sig Dispense Refill  . docusate sodium (COLACE) 100 MG capsule Take 1 capsule (100 mg total) by mouth 2 (two) times daily. 10 capsule 0  . ibuprofen (ADVIL) 600 MG tablet  Take 1 tablet (600 mg total) by mouth every 6 (six) hours. 30 tablet 0  . Iron-FA-B Cmp-C-Biot-Probiotic (FUSION PLUS) CAPS Take 1 tablet by mouth daily. 30 capsule 4  . Prenatal Vit-Fe Fumarate-FA (PRENATAL MULTIVITAMIN) TABS tablet Take 1 tablet by mouth daily at 12 noon.    . simethicone (MYLICON) 80 MG chewable tablet Chew 1 tablet (80 mg total) by mouth as needed for flatulence. 30 tablet 0  . norethindrone (MICRONOR) 0.35 MG tablet Take 1 tablet (0.35 mg total) by mouth daily. (Patient not taking: Reported on 04/04/2020) 1 Package 11   No current facility-administered medications on file prior to visit.    No Known Allergies  Social History   Socioeconomic History  . Marital status: Married    Spouse name: Erlene Quan  . Number of children: 1  . Years of education: Not on file  . Highest education level: Not on file  Occupational History  . Not on file  Tobacco Use  . Smoking status: Never Smoker  . Smokeless tobacco: Never Used  Substance and Sexual Activity  . Alcohol use: No  . Drug use: No  . Sexual activity: Not Currently  Other Topics Concern  . Not on file  Social History Narrative  Lives in [place]; work at hospital at [employer]; no smoking; no alcohol.    Social Determinants of Health   Financial Resource Strain:   . Difficulty of Paying Living Expenses:   Food Insecurity:   . Worried About Programme researcher, broadcasting/film/video in the Last Year:   . Barista in the Last Year:   Transportation Needs:   . Freight forwarder (Medical):   Marland Kitchen Lack of Transportation (Non-Medical):   Physical Activity:   . Days of Exercise per Week:   . Minutes of Exercise per Session:   Stress:   . Feeling of Stress :   Social Connections:   . Frequency of Communication with Friends and Family:   . Frequency of Social Gatherings with Friends and Family:   . Attends Religious Services:   . Active Member of Clubs or Organizations:   . Attends Banker Meetings:   Marland Kitchen  Marital Status:   Intimate Partner Violence:   . Fear of Current or Ex-Partner:   . Emotionally Abused:   Marland Kitchen Physically Abused:   . Sexually Abused:     Family History  Problem Relation Age of Onset  . Hypertension Mother   . Arrhythmia Mother        palpitations  . Hyperlipidemia Mother   . Heart attack Maternal Uncle 42  . Heart attack Maternal Grandmother   . Heart disease Maternal Grandmother        CABG  . Stroke Maternal Grandmother   . Osteoarthritis Maternal Grandmother   . Hypertension Maternal Grandfather   . Stroke Maternal Grandfather     The following portions of the patient's history were reviewed and updated as appropriate: allergies, current medications, past family history, past medical history, past social history, past surgical history and problem list.  Review of Systems  ROS-Negative except as noted above. Information obtained from patient.  Objective:   BP 123/73   Pulse 85   Ht 5' (1.524 m)   Wt 155 lb 2 oz (70.4 kg)   LMP 06/24/2019   BMI 30.30 kg/m    CONSTITUTIONAL: Well-developed, well-nourished female in no acute distress.   ABDOMEN: Soft, non distended; tender with palation.  No Organomegaly.  PELVIC:  External Genitalia: Normal  Vagina: Normal, clots present  Uterus: Normal size, shape,consistency, mobile  Assessment:   1. History of postpartum hemorrhage  - CBC  2. Postpartum coagulation defect with hemorrhage  - CBC     Plan:   Rx Lysteda,see orders.  Labs: CBC today  Follow up with Hematology.  Reviewed red flags and when to call the office.  RTC as previously scheduled    Glorious Peach RN [facility] [employer] 04/04/20 1:13 PM

## 2020-04-04 NOTE — Progress Notes (Signed)
I have seen, interviewed, and examined the patient in conjunction with the [employer] Nurse Practitioner student and affirm the diagnosis and management plan.   Gunnar Bulla, CNM [facility], [facility] 04/04/20 1:40 PM

## 2020-04-05 LAB — CBC
Hematocrit: 30.1 % — ABNORMAL LOW (ref 34.0–46.6)
Hemoglobin: 9.9 g/dL — ABNORMAL LOW (ref 11.1–15.9)
MCH: 28.7 pg (ref 26.6–33.0)
MCHC: 32.9 g/dL (ref 31.5–35.7)
MCV: 87 fL (ref 79–97)
Platelets: 180 10*3/uL (ref 150–450)
RBC: 3.45 x10E6/uL — ABNORMAL LOW (ref 3.77–5.28)
RDW: 15.5 % — ABNORMAL HIGH (ref 11.7–15.4)
WBC: 10.9 10*3/uL — ABNORMAL HIGH (ref 3.4–10.8)

## 2020-04-12 ENCOUNTER — Telehealth: Payer: Self-pay | Admitting: Lactation Services

## 2020-04-12 NOTE — Telephone Encounter (Signed)
Had viral stomach upset and milk supply has decreased, sl better last two days, needs recommendation on what to do , should she take Fenugreek, recommended frequent feeds and pumping breasts after to increase supply, may take 48-72 hours, hold off on Fenugreek because has intestinal side effects, baby's wt is okay, vd and stooling, if supply not increased after 48- 72 hrs call [facility] office

## 2020-04-15 ENCOUNTER — Other Ambulatory Visit: Payer: Self-pay | Admitting: Certified Nurse Midwife

## 2020-04-15 MED ORDER — SERTRALINE HCL 50 MG PO TABS
50.0000 mg | ORAL_TABLET | Freq: Every day | ORAL | 1 refills | Status: DC
Start: 2020-04-15 — End: 2020-05-13

## 2020-04-15 NOTE — Progress Notes (Signed)
Pt mother reached out to me with concerns that Alexandra Joseph is depressed. She state that she is  Alexandra Joseph is crying all the time and haspostpartum depression. Her mother Alexandra Joseph) said that Alexandra Joseph confided in her and feels that everything is  weighting on her. Alexandra Joseph was at dinner last night with her family and randomly started crying. She is willing to start medications but declines counseling at this time. Orders placed for Zoloft   [name], CNM

## 2020-04-17 ENCOUNTER — Ambulatory Visit (INDEPENDENT_AMBULATORY_CARE_PROVIDER_SITE_OTHER): Payer: BC Managed Care – PPO | Admitting: Certified Nurse Midwife

## 2020-04-17 ENCOUNTER — Encounter: Payer: Self-pay | Admitting: Certified Nurse Midwife

## 2020-04-17 ENCOUNTER — Other Ambulatory Visit: Payer: Self-pay

## 2020-04-17 DIAGNOSIS — Z1331 Encounter for screening for depression: Secondary | ICD-10-CM

## 2020-04-17 NOTE — Progress Notes (Signed)
Virtual Visit via Telephone Note  I connected with Alexandra Joseph on 04/17/20 at 10:30 AM EDT by telephone and verified that I am speaking with the correct person using two identifiers.   I discussed the limitations, risks, security and privacy concerns of performing an evaluation and management service by telephone and the availability of in person appointments. I also discussed with the patient that there may be a patient responsible charge related to this service. The patient expressed understanding and agreed to proceed.  Present on phone Patient @ home [name], [place] @ office Yevonne Aline Nurse @ office  History of Present Illness: 24 yr old female 2 wks postpartum.    Observations/Objective: Pt state she starts crying randomly, and feels anxious. When anxious gets hives. She states she is not sleeping well. Sleeps x 1 hr, her mind doesn't stop, she just wakes up. She has decreased appetite. Her mother reached out to me on 5/31 with concerns. Zoloft was ordered she will pick up and start today.   Assessment and Plan:   Office Visit from 04/17/2020 in [facility]  PHQ-9 Total Score  10      Discussed postpartum depression and anxiety. She states she has support her mother is 3 min away. Encouraged her to ask if she can help one night a week to allow her to sleep for more than 1 hr. Also to ask her partner to help on nights when he does not work the next day. Encouraged her to to continue to eat regular or frequent snacks to ensure milk productions. Encouraged PO hydration for milk productions. She verbaizes and agress. She deneis desire to hurt herself or others. Information sent to pt via my chart PSI.    Follow Up Instructions: 4 wks for ppv or sooner if needed.    I discussed the assessment and treatment plan with the patient. The patient was provided an opportunity to ask questions and all were answered. The patient agreed with the plan and demonstrated an  understanding of the instructions.   The patient was advised to call back or seek an in-person evaluation if the symptoms worsen or if the condition fails to improve as anticipated.  I provided 12 minutes of non-face-to-face time during this encounter.   [name], CNM

## 2020-04-17 NOTE — Progress Notes (Signed)
Patient was contacted for a televisit. DOB as identifier. PhQ9 = 10. Is breast feeding and doing well. Menstrual flow is normal/light. Telephone call transferred to [facility] CNM.

## 2020-05-13 ENCOUNTER — Encounter: Payer: Self-pay | Admitting: Certified Nurse Midwife

## 2020-05-13 ENCOUNTER — Ambulatory Visit (INDEPENDENT_AMBULATORY_CARE_PROVIDER_SITE_OTHER): Payer: BC Managed Care – PPO | Admitting: Certified Nurse Midwife

## 2020-05-13 ENCOUNTER — Other Ambulatory Visit: Payer: Self-pay

## 2020-05-13 MED ORDER — SERTRALINE HCL 50 MG PO TABS: 50.0000 mg | ORAL_TABLET | Freq: Every day | ORAL | 9 refills | Status: DC

## 2020-05-13 NOTE — Patient Instructions (Signed)

## 2020-05-13 NOTE — Progress Notes (Signed)
Subjective:    Alexandra Joseph is a 24 y.o. [id number] Caucasian female who presents for a postpartum visit. She is 6 weeks postpartum following a spontaneous vaginal delivery and vacuum assit  at 39.5 gestational weeks. Anesthesia: epidural. I have fully reviewed the prenatal and intrapartum course. Postpartum course has been complicated with postpartum derpession. She was started on zoloft 50 mg PO daily 6/2  Baby's course has been WNL. Baby is feeding by breast. Bleeding no bleeding. Bowel function is normal. Bladder function is normal. Patient is not sexually active. Last sexual activity: prior to delivery. Contraception method is IUD. Postpartum depression screening: mild. Score 9.  Last pap  09/13/19 and was neggative.  The following portions of the patient's history were reviewed and updated as appropriate: allergies, current medications, past medical history, past surgical history and problem list.  Review of Systems Pertinent items are noted in HPI.   Vitals:   05/13/20 1423  BP: 114/76  Pulse: 87  Weight: 142 lb 9 oz (64.7 kg)  Height: 5' (1.524 m)   Patient's last menstrual period was 06/24/2019.  Objective:   General:  alert, cooperative and no distress   Breasts:  deferred, no complaints  Lungs: clear to auscultation bilaterally  Heart:  regular rate and rhythm  Abdomen: soft, nontender   Vulva: normal  Vagina: normal vagina  Cervix:  closed  Corpus: Well-involuted  Adnexa:  Non-palpable  Rectal Exam: no hemorrhoids          Office Visit from 05/13/2020 in [facility]  PHQ-9 Total Score 9       Assessment:   Postpartum exam 6 wks s/p Vaginal with Vacuum  Breastfeeding Depression screening mild Contraception counseling   Plan:  : IUD, plans mirena Follow up in: prn for IUD placement.   Pt state she feels like zoloft is working well. Feels like herself again. Pt mother is with her and she verbalizes agreement.   [name], CNM

## 2020-05-21 ENCOUNTER — Ambulatory Visit (INDEPENDENT_AMBULATORY_CARE_PROVIDER_SITE_OTHER): Payer: BC Managed Care – PPO | Admitting: Certified Nurse Midwife

## 2020-05-21 ENCOUNTER — Other Ambulatory Visit: Payer: Self-pay

## 2020-05-21 ENCOUNTER — Encounter: Payer: Self-pay | Admitting: Certified Nurse Midwife

## 2020-05-21 VITALS — BP 114/61 | HR 71 | Ht 60.0 in | Wt 142.4 lb

## 2020-05-21 DIAGNOSIS — Z30430 Encounter for insertion of intrauterine contraceptive device: Secondary | ICD-10-CM | POA: Diagnosis not present

## 2020-05-21 LAB — POCT URINE PREGNANCY: Preg Test, Ur: NEGATIVE

## 2020-05-21 MED ORDER — TRIAMCINOLONE ACETONIDE 0.025 % EX OINT
1.0000 | TOPICAL_OINTMENT | Freq: Two times a day (BID) | CUTANEOUS | 0 refills | Status: DC
Start: 2020-05-21 — End: 2021-03-11

## 2020-05-21 NOTE — Progress Notes (Addendum)
° ° °  GYNECOLOGY OFFICE PROCEDURE NOTE  Alexandra Joseph is a 24 y.o. G2P1001 here for mirena IUD insertion. No GYN concerns.  Last pap smear was on 09/13/19 and was normal.  IUD Insertion Procedure Note Patient identified, informed consent performed, consent signed.   Discussed risks of irregular bleeding, cramping, infection, malpositioning or misplacement of the IUD outside the uterus which may require further procedure such as laparoscopy. Also discussed >99% contraception efficacy, increased risk of ectopic pregnancy with failure of method.  Time out was performed.  Urine pregnancy test negative.  Speculum placed in the vagina.  Cervix visualized.  Cleaned with Betadine x 2.  Grasped anteriorly with a single tooth tenaculum.  Uterus sounded to 8 cm.  mirena  IUD placed per manufacturer's recommendations.  Strings trimmed to 3 cm. Tenaculum was removed, good hemostasis noted.  Patient tolerated procedure well.   She has a rash on her back that she has had since after delivery. Not itching, picks at it. Orders placed for steroid ointment.   Patient was given post-procedure instructions.  She was advised to have backup contraception for one week.  Patient was also asked to check IUD strings periodically and follow up in 4 weeks for IUD check.

## 2020-05-21 NOTE — Patient Instructions (Signed)
Intrauterine Device Insertion, Care After  This sheet gives you information about how to care for yourself after your procedure. Your health care provider may also give you more specific instructions. If you have problems or questions, contact your health care provider. What can I expect after the procedure? After the procedure, it is common to have:  Cramps and pain in the abdomen.  Light bleeding (spotting) or heavier bleeding that is like your menstrual period. This may last for up to a few days.  Lower back pain.  Dizziness.  Headaches.  Nausea. Follow these instructions at home:  Before resuming sexual activity, check to make sure that you can feel the IUD string(s). You should be able to feel the end of the string(s) below the opening of your cervix. If your IUD string is in place, you may resume sexual activity. ? If you had a hormonal IUD inserted more than 7 days after your most recent period started, you will need to use a backup method of birth control for 7 days after IUD insertion. Ask your health care provider whether this applies to you.  Continue to check that the IUD is still in place by feeling for the string(s) after every menstrual period, or once a month.  Take over-the-counter and prescription medicines only as told by your health care provider.  Do not drive or use heavy machinery while taking prescription pain medicine.  Keep all follow-up visits as told by your health care provider. This is important. Contact a health care provider if:  You have bleeding that is heavier or lasts longer than a normal menstrual cycle.  You have a fever.  You have cramps or abdominal pain that get worse or do not get better with medicine.  You develop abdominal pain that is new or is not in the same area of earlier cramping and pain.  You feel lightheaded or weak.  You have abnormal or bad-smelling discharge from your vagina.  You have pain during sexual  activity.  You have any of the following problems with your IUD string(s): ? The string bothers or hurts you or your sexual partner. ? You cannot feel the string. ? The string has gotten longer.  You can feel the IUD in your vagina.  You think you may be pregnant, or you miss your menstrual period.  You think you may have an STI (sexually transmitted infection). Get help right away if:  You have flu-like symptoms.  You have a fever and chills.  You can feel that your IUD has slipped out of place. Summary  After the procedure, it is common to have cramps and pain in the abdomen. It is also common to have light bleeding (spotting) or heavier bleeding that is like your menstrual period.  Continue to check that the IUD is still in place by feeling for the string(s) after every menstrual period, or once a month.  Keep all follow-up visits as told by your health care provider. This is important.  Contact your health care provider if you have problems with your IUD string(s), such as the string getting longer or bothering you or your sexual partner. This information is not intended to replace advice given to you by your health care provider. Make sure you discuss any questions you have with your health care provider. Document Revised: 10/15/2017 Document Reviewed: 09/23/2016 Elsevier Patient Education  2020 Elsevier Inc.  

## 2020-06-18 ENCOUNTER — Encounter: Payer: BC Managed Care – PPO | Admitting: Certified Nurse Midwife

## 2020-06-19 ENCOUNTER — Ambulatory Visit (INDEPENDENT_AMBULATORY_CARE_PROVIDER_SITE_OTHER): Payer: BC Managed Care – PPO | Admitting: Certified Nurse Midwife

## 2020-06-19 ENCOUNTER — Encounter: Payer: Self-pay | Admitting: Certified Nurse Midwife

## 2020-06-19 VITALS — BP 112/71 | HR 70 | Ht 60.0 in | Wt 145.2 lb

## 2020-06-19 DIAGNOSIS — Z30431 Encounter for routine checking of intrauterine contraceptive device: Secondary | ICD-10-CM | POA: Diagnosis not present

## 2020-06-19 NOTE — Progress Notes (Signed)
° ° ° °  GYNECOLOGY OFFICE ENCOUNTER NOTE  History:  23 y.o. G2P1001 here today for today for IUD string check; Mirena  IUD was placed  05/21/20. No complaints about the IUD, no concerning side effects.  The following portions of the patient's history were reviewed and updated as appropriate: allergies, current medications, past family history, past medical history, past social history, past surgical history and problem list. Last pap smear on 10/28/20was normal.   Review of Systems:  Pertinent items are noted in HPI.   Objective:  Physical Exam Last menstrual period 06/24/2019. CONSTITUTIONAL: Well-developed, well-nourished female in no acute distress.  HENT:  Normocephalic, atraumatic. External right and left ear normal. Oropharynx is clear and moist EYES: Conjunctivae and EOM are normal. Pupils are equal, round, and reactive to light. No scleral icterus.  NECK: Normal range of motion, supple, no masses CARDIOVASCULAR: Normal heart rate noted RESPIRATORY: Effort and breath sounds normal, no problems with respiration noted ABDOMEN: Soft, no distention noted.   PELVIC: Normal appearing external genitalia; normal appearing vaginal mucosa and cervix.  IUD strings visualized, about 3 cm in length outside cervix.   Assessment & Plan:  Patient to keep IUD in place for up to five  years; can come in for removal if she desires pregnancy earlier or for any concerning side effects.   [name], CNM

## 2020-06-19 NOTE — Patient Instructions (Signed)

## 2020-07-24 ENCOUNTER — Other Ambulatory Visit: Payer: Self-pay | Admitting: Certified Nurse Midwife

## 2021-01-08 ENCOUNTER — Encounter: Payer: BC Managed Care – PPO | Admitting: Certified Nurse Midwife

## 2021-01-13 ENCOUNTER — Ambulatory Visit (INDEPENDENT_AMBULATORY_CARE_PROVIDER_SITE_OTHER): Payer: BC Managed Care – PPO | Admitting: Certified Nurse Midwife

## 2021-01-13 ENCOUNTER — Encounter: Payer: Self-pay | Admitting: Certified Nurse Midwife

## 2021-01-13 ENCOUNTER — Other Ambulatory Visit: Payer: Self-pay

## 2021-01-13 VITALS — BP 91/55 | HR 76 | Wt 155.4 lb

## 2021-01-13 DIAGNOSIS — Z1159 Encounter for screening for other viral diseases: Secondary | ICD-10-CM | POA: Diagnosis not present

## 2021-01-13 DIAGNOSIS — Z13220 Encounter for screening for lipoid disorders: Secondary | ICD-10-CM

## 2021-01-13 DIAGNOSIS — Z01419 Encounter for gynecological examination (general) (routine) without abnormal findings: Secondary | ICD-10-CM | POA: Diagnosis not present

## 2021-01-13 NOTE — Progress Notes (Signed)
GYNECOLOGY ANNUAL PREVENTATIVE CARE ENCOUNTER NOTE  History:     Alexandra Joseph is a 25 y.o. G75P1001 female here for a routine annual gynecologic exam.  Current complaints: none.   Denies abnormal vaginal bleeding, discharge, pelvic pain, problems with intercourse or other gynecologic concerns.  States she has had some cramping and wants to make sure IUD strings are good. She has stopped taking the Zoloft states she has been doing well with her mood.     Social Relationship: married  Living:with spouse and 2 children  Work: Runner, broadcasting/film/video Exercise: daily for 1 hr  Smoke/Alcohol/drug use: none  Gynecologic History No LMP recorded. Contraception: IUD Last Pap: 09/13/19. Results were: normal Last mammogram: n/a   The pregnancy intention screening data noted above was reviewed. Potential methods of contraception were discussed. The patient elected to proceed with IUD or IUS.    Obstetric History OB History  Gravida Para Term Preterm AB Living  2 1 1     1   SAB IAB Ectopic Multiple Live Births          1    # Outcome Date GA Lbr Len/2nd Weight Sex Delivery Anes PTL Lv  2 Gravida           1 Term 07/23/17 [redacted]w[redacted]d  7 lb 10 oz (3.459 kg) M  EPI N LIV     Complications: Other Excessive Bleeding    Obstetric Comments  Retained placenta and D&C 1 day PP, anemia resulted with blood transfused.    Past Medical History:  Diagnosis Date  . Anemia   . Anxiety   . Costochondritis   . Depression   . Menorrhagia   . Migraine   . Tachycardia     Past Surgical History:  Procedure Laterality Date  . ADENOIDECTOMY    . DILATION AND EVACUATION N/A 07/23/2017   Procedure: DILATATION AND EVACUATION;  Surgeon: 09/22/2017, MD;  Location: [facility] ORS;  Service: Gynecology;  Laterality: N/A;  . TONSILLECTOMY      Current Outpatient Medications on File Prior to Visit  Medication Sig Dispense Refill  . Iron-FA-B Cmp-C-Biot-Probiotic (FUSION PLUS) CAPS Take 1 tablet by mouth daily.  (Patient not taking: Reported on 01/13/2021) 30 capsule 4  . Prenatal Vit-Fe Fumarate-FA (PRENATAL MULTIVITAMIN) TABS tablet Take 1 tablet by mouth daily at 12 noon. (Patient not taking: Reported on 01/13/2021)    . sertraline (ZOLOFT) 50 MG tablet Take 1 tablet (50 mg total) by mouth daily. (Patient not taking: Reported on 01/13/2021) 30 tablet 9  . triamcinolone (KENALOG) 0.025 % ointment Apply 1 application topically 2 (two) times daily. (Patient not taking: Reported on 01/13/2021) 30 g 0   No current facility-administered medications on file prior to visit.    No Known Allergies  Social History:  reports that she has never smoked. She has never used smokeless tobacco. She reports that she does not drink alcohol and does not use drugs.  Family History  Problem Relation Age of Onset  . Hypertension Mother   . Arrhythmia Mother        palpitations  . Hyperlipidemia Mother   . Heart attack Maternal Uncle 42  . Heart attack Maternal Grandmother   . Heart disease Maternal Grandmother        CABG  . Stroke Maternal Grandmother   . Osteoarthritis Maternal Grandmother   . Hypertension Maternal Grandfather   . Stroke Maternal Grandfather     The following portions of the patient's history were reviewed  and updated as appropriate: allergies, current medications, past family history, past medical history, past social history, past surgical history and problem list.  Review of Systems Pertinent items noted in HPI and remainder of comprehensive ROS otherwise negative.  Physical Exam:  BP (!) 91/55   Pulse 76   Wt 155 lb 6.4 oz (70.5 kg)   BMI 30.35 kg/m  CONSTITUTIONAL: Well-developed, well-nourished female in no acute distress.  HENT:  Normocephalic, atraumatic, External right and left ear normal. Oropharynx is clear and moist EYES: Conjunctivae and EOM are normal. Pupils are equal, round, and reactive to light. No scleral icterus.  NECK: Normal range of motion, supple, no masses.   Normal thyroid.  SKIN: Skin is warm and dry. No rash noted. Not diaphoretic. No erythema. No pallor. MUSCULOSKELETAL: Normal range of motion. No tenderness.  No cyanosis, clubbing, or edema.  2+ distal pulses. NEUROLOGIC: Alert and oriented to person, place, and time. Normal reflexes, muscle tone coordination.  PSYCHIATRIC: Normal mood and affect. Normal behavior. Normal judgment and thought content. CARDIOVASCULAR: Normal heart rate noted, regular rhythm RESPIRATORY: Clear to auscultation bilaterally. Effort and breath sounds normal, no problems with respiration noted. BREASTS: Symmetric in size. No masses, tenderness, skin changes, nipple drainage, or lymphadenopathy bilaterally.  ABDOMEN: Soft, no distention noted.  No tenderness, rebound or guarding.  PELVIC: Normal appearing external genitalia and urethral meatus; normal appearing vaginal mucosa and cervix.  No abnormal discharge noted.  Pap smear not indicated, strings present.  Normal uterine size, no other palpable masses, no uterine or adnexal tenderness.  .   Assessment and Plan:    1. Women's annual routine gynecological examination Pap:not due Mammogram : n/a  Labs: hep c, lipid  Refills: none Referral: none Routine preventative health maintenance measures emphasized. Please refer to After Visit Summary for other counseling recommendations.      [name], CNM [facility],  [facility]

## 2021-01-13 NOTE — Patient Instructions (Signed)
Preventive Care 21-24 Years Old, Female Preventive care refers to lifestyle choices and visits with your health care provider that can promote health and wellness. This includes:  A yearly physical exam. This is also called an annual wellness visit.  Regular dental and eye exams.  Immunizations.  Screening for certain conditions.  Healthy lifestyle choices, such as: ? Eating a healthy diet. ? Getting regular exercise. ? Not using drugs or products that contain nicotine and tobacco. ? Limiting alcohol use. What can I expect for my preventive care visit? Physical exam Your health care provider may check your:  Height and weight. These may be used to calculate your BMI (body mass index). BMI is a measurement that tells if you are at a healthy weight.  Heart rate and blood pressure.  Body temperature.  Skin for abnormal spots. Counseling Your health care provider may ask you questions about your:  Past medical problems.  Family's medical history.  Alcohol, tobacco, and drug use.  Emotional well-being.  Home life and relationship well-being.  Sexual activity.  Diet, exercise, and sleep habits.  Work and work environment.  Access to firearms.  Method of birth control.  Menstrual cycle.  Pregnancy history. What immunizations do I need? Vaccines are usually given at various ages, according to a schedule. Your health care provider will recommend vaccines for you based on your age, medical history, and lifestyle or other factors, such as travel or where you work.   What tests do I need? Blood tests  Lipid and cholesterol levels. These may be checked every 5 years starting at age 20.  Hepatitis C test.  Hepatitis B test. Screening  Diabetes screening. This is done by checking your blood sugar (glucose) after you have not eaten for a while (fasting).  STD (sexually transmitted disease) testing, if you are at risk.  BRCA-related cancer screening. This may be  done if you have a family history of breast, ovarian, tubal, or peritoneal cancers.  Pelvic exam and Pap test. This may be done every 3 years starting at age 21. Starting at age 30, this may be done every 5 years if you have a Pap test in combination with an HPV test. Talk with your health care provider about your test results, treatment options, and if necessary, the need for more tests.   Follow these instructions at home: Eating and drinking  Eat a healthy diet that includes fresh fruits and vegetables, whole grains, lean protein, and low-fat dairy products.  Take vitamin and mineral supplements as recommended by your health care provider.  Do not drink alcohol if: ? Your health care provider tells you not to drink. ? You are pregnant, may be pregnant, or are planning to become pregnant.  If you drink alcohol: ? Limit how much you have to 0-1 drink a day. ? Be aware of how much alcohol is in your drink. In the [place] one drink equals one 12 oz bottle of beer (355 mL), one 5 oz glass of wine (148 mL), or one 1 oz glass of hard liquor (44 mL).   Lifestyle  Take daily care of your teeth and gums. Brush your teeth every morning and night with fluoride toothpaste. Floss one time each day.  Stay active. Exercise for at least 30 minutes 5 or more days each week.  Do not use any products that contain nicotine or tobacco, such as cigarettes, e-cigarettes, and chewing tobacco. If you need help quitting, ask your health care provider.  Do not   use drugs.  If you are sexually active, practice safe sex. Use a condom or other form of protection to prevent STIs (sexually transmitted infections).  If you do not wish to become pregnant, use a form of birth control. If you plan to become pregnant, see your health care provider for a prepregnancy visit.  Find healthy ways to cope with stress, such as: ? Meditation, yoga, or listening to music. ? Journaling. ? Talking to a trusted  person. ? Spending time with friends and family. Safety  Always wear your seat belt while driving or riding in a vehicle.  Do not drive: ? If you have been drinking alcohol. Do not ride with someone who has been drinking. ? When you are tired or distracted. ? While texting.  Wear a helmet and other protective equipment during sports activities.  If you have firearms in your house, make sure you follow all gun safety procedures.  Seek help if you have been physically or sexually abused. What's next?  Go to your health care provider once a year for an annual wellness visit.  Ask your health care provider how often you should have your eyes and teeth checked.  Stay up to date on all vaccines. This information is not intended to replace advice given to you by your health care provider. Make sure you discuss any questions you have with your health care provider. Document Revised: 06/30/2020 Document Reviewed: 07/14/2018 Elsevier Patient Education  2021 Elsevier Inc.  

## 2021-01-14 LAB — LIPID PANEL
Chol/HDL Ratio: 4.3 ratio (ref 0.0–4.4)
Cholesterol, Total: 173 mg/dL (ref 100–199)
HDL: 40 mg/dL (ref >39)
LDL Chol Calc (NIH): 116 mg/dL — ABNORMAL HIGH (ref 0–99)
Triglycerides: 89 mg/dL (ref 0–149)
VLDL Cholesterol Cal: 17 mg/dL (ref 5–40)

## 2021-01-14 LAB — HEPATITIS C ANTIBODY: Hep C Virus Ab: <0.1 s/co ratio (ref 0.0–0.9)

## 2021-03-11 ENCOUNTER — Other Ambulatory Visit: Payer: Self-pay

## 2021-03-11 ENCOUNTER — Ambulatory Visit
Admission: EM | Admit: 2021-03-11 | Discharge: 2021-03-11 | Disposition: A | Discharge status: Home / Self Care | Payer: BC Managed Care – PPO | Attending: Family Medicine | Admitting: Family Medicine

## 2021-03-11 ENCOUNTER — Encounter: Payer: Self-pay | Admitting: Emergency Medicine

## 2021-03-11 DIAGNOSIS — F410 Panic disorder [episodic paroxysmal anxiety] without agoraphobia: Secondary | ICD-10-CM

## 2021-03-11 MED ORDER — ONDANSETRON HCL 4 MG PO TABS: 4.0000 mg | ORAL_TABLET | Freq: Three times a day (TID) | ORAL | 0 refills | Status: DC | PRN

## 2021-03-11 MED ORDER — ONDANSETRON 8 MG PO TBDP
8.0000 mg | ORAL_TABLET | Freq: Once | ORAL | Status: AC
  Administered 2021-03-11: 8 mg via ORAL

## 2021-03-11 NOTE — ED Provider Notes (Signed)
[facility]    CSN: [id number] Arrival date & time: 03/11/21  1101      History   Chief Complaint Chief Complaint  Patient presents with  . Numbness  . Emesis   HPI 25 year old female presents with the above complaints.  Patient states that she was at work approximately 1 hour ago and suddenly felt numbness in her right middle finger and hand and then subsequently of her face, around the mouth, and around the nose.  She states that she still does not feel back to her baseline.  Associated headache and dizziness.  She had nausea and vomiting.  Vomiting is subsided.  Her headache is bitemporal.  The numbness/tingling has essentially resolved.  Currently complains of nausea and headache.  Has a history of anxiety.  Patient states that she has not eaten today.  She did drink an energy drink.  No significant vision changes.  No difficulty talking or speaking.  No other reported symptoms.  No other complaints.  Past Medical History:  Diagnosis Date  . Anemia   . Anxiety   . Costochondritis   . Depression   . Menorrhagia   . Migraine   . Tachycardia     Patient Active Problem List   Diagnosis Date Noted  . History of thrombocytopenia 03/31/2020  . History of postpartum hemorrhage 01/12/2020  . History of retained placenta 01/12/2020    Past Surgical History:  Procedure Laterality Date  . ADENOIDECTOMY    . DILATION AND EVACUATION N/A 07/23/2017   Procedure: DILATATION AND EVACUATION;  Surgeon: Linzie Collin, MD;  Location: [facility] ORS;  Service: Gynecology;  Laterality: N/A;  . TONSILLECTOMY      OB History    Gravida  2   Para  1   Term  1   Preterm      AB      Living  1     SAB      IAB      Ectopic      Multiple      Live Births  1        Obstetric Comments  Retained placenta and D&C 1 day PP, anemia resulted with blood transfused.         Home Medications    Prior to Admission medications   Medication Sig Start Date End Date  Taking? Authorizing Provider  ondansetron (ZOFRAN) 4 MG tablet Take 1 tablet (4 mg total) by mouth every 8 (eight) hours as needed for nausea or vomiting. 03/11/21  Yes Adriana Simas, Manu Rubey G, DO  sertraline (ZOLOFT) 50 MG tablet Take 1 tablet (50 mg total) by mouth daily. Patient not taking: No sig reported 05/13/20 03/11/21  [name], CNM    Family History Family History  Problem Relation Age of Onset  . Hypertension Mother   . Arrhythmia Mother        palpitations  . Hyperlipidemia Mother   . Heart attack Maternal Uncle 42  . Heart attack Maternal Grandmother   . Heart disease Maternal Grandmother        CABG  . Stroke Maternal Grandmother   . Osteoarthritis Maternal Grandmother   . Hypertension Maternal Grandfather   . Stroke Maternal Grandfather     Social History Social History   Tobacco Use  . Smoking status: Never Smoker  . Smokeless tobacco: Never Used  Vaping Use  . Vaping Use: Never used  Substance Use Topics  . Alcohol use: No  . Drug use: No  Allergies   Patient has no known allergies.   Review of Systems Review of Systems  Constitutional: Negative.   Gastrointestinal: Positive for nausea and vomiting.  Neurological: Positive for headaches.       Paresthesia.   Physical Exam Triage Vital Signs ED Triage Vitals  Enc Vitals Group     BP 03/11/21 1141 121/82     Pulse Rate 03/11/21 1141 71     Resp 03/11/21 1141 18     Temp 03/11/21 1141 98.2 F (36.8 C)     Temp Source 03/11/21 1141 Oral     SpO2 03/11/21 1141 99 %     Weight 03/11/21 1139 155 lb 6.8 oz (70.5 kg)     Height 03/11/21 1139 5' (1.524 m)     Head Circumference --      Peak Flow --      Pain Score 03/11/21 1139 0     Pain Loc --      Pain Edu? --      Excl. in GC? --    No data found.  Updated Vital Signs BP 121/82 (BP Location: Left Arm)   Pulse 71   Temp 98.2 F (36.8 C) (Oral)   Resp 18   Ht 5' (1.524 m)   Wt 70.5 kg   SpO2 99%   Breastfeeding No   BMI 30.35 kg/m    Visual Acuity Right Eye Distance:   Left Eye Distance:   Bilateral Distance:    Right Eye Near:   Left Eye Near:    Bilateral Near:     Physical Exam Constitutional:      General: She is not in acute distress.    Appearance: Normal appearance. She is not ill-appearing.  HENT:     Head: Normocephalic and atraumatic.     Right Ear: Tympanic membrane normal.     Left Ear: Tympanic membrane normal.     Nose: Nose normal.     Mouth/Throat:     Pharynx: Oropharynx is clear. No oropharyngeal exudate or posterior oropharyngeal erythema.  Eyes:     General:        Right eye: No discharge.        Left eye: No discharge.     Extraocular Movements: Extraocular movements intact.     Conjunctiva/sclera: Conjunctivae normal.     Pupils: Pupils are equal, round, and reactive to light.  Cardiovascular:     Rate and Rhythm: Normal rate and regular rhythm.     Heart sounds: No murmur heard.   Pulmonary:     Effort: Pulmonary effort is normal.     Breath sounds: Normal breath sounds. No wheezing, rhonchi or rales.  Musculoskeletal:     Cervical back: Neck supple.  Neurological:     General: No focal deficit present.     Mental Status: She is alert.     Cranial Nerves: No cranial nerve deficit.     Motor: No weakness.  Psychiatric:        Behavior: Behavior normal.     Comments: Flat affect.    UC Treatments / Results  Labs (all labs ordered are listed, but only abnormal results are displayed) Labs Reviewed - No data to display  EKG   Radiology No results found.  Procedures Procedures (including critical care time)  Medications Ordered in UC Medications  ondansetron (ZOFRAN-ODT) disintegrating tablet 8 mg (8 mg Oral Given 03/11/21 1202)    Initial Impression / Assessment and Plan / UC Course  I  have reviewed the triage vital signs and the nursing notes.  Pertinent labs & imaging results that were available during my care of the patient were reviewed by me and  considered in my medical decision making (see chart for details).    25 year old female presents with the above complaints.  Based off of her history and examination, I believe that she has been experiencing panic attack.  Her neurological exam is normal.  She is well-appearing.  Zofran was given with resolution of her nausea.  She states that she is feeling improved and has a mild headache.  She has no other symptoms at this time.  Sending home on Zofran.  Advised hydration.  Supportive care.  Final Clinical Impressions(s) / UC Diagnoses   Final diagnoses:  Panic attack     Discharge Instructions     Rest. Fluids.  No more energy drinks.  Take care  Dr. Adriana Simas    ED Prescriptions    Medication Sig Dispense Auth. Provider   ondansetron (ZOFRAN) 4 MG tablet Take 1 tablet (4 mg total) by mouth every 8 (eight) hours as needed for nausea or vomiting. 20 tablet Tommie Sams, DO     PDMP not reviewed this encounter.   Tommie Sams, [place] 03/11/21 1308

## 2021-03-11 NOTE — ED Triage Notes (Signed)
Pt states she was at work today when her right middle finger started feeling numb, then her hand and then her nose, lips and right side of her face, then she vomited. She states her face still feels weird. She also has dizziness and headache.

## 2021-03-11 NOTE — Discharge Instructions (Signed)
Rest. Fluids.  No more energy drinks.  Take care  Dr. Adriana Simas

## 2021-07-24 NOTE — Telephone Encounter (Signed)
Called patient to inform her that bleeding was normal with IUD. If bleeding does not resolve and cramping is not control with Tylenol to come in to be seen. She will keep her scheduled appointment.

## 2021-08-08 ENCOUNTER — Encounter: Payer: BC Managed Care – PPO | Admitting: Obstetrics and Gynecology

## 2021-08-11 ENCOUNTER — Encounter: Payer: Self-pay | Admitting: Obstetrics and Gynecology

## 2021-08-18 ENCOUNTER — Other Ambulatory Visit: Payer: Self-pay | Admitting: Certified Nurse Midwife

## 2021-08-19 ENCOUNTER — Telehealth: Payer: Self-pay | Admitting: Certified Nurse Midwife

## 2021-08-19 ENCOUNTER — Ambulatory Visit (INDEPENDENT_AMBULATORY_CARE_PROVIDER_SITE_OTHER): Payer: BC Managed Care – PPO | Admitting: Certified Nurse Midwife

## 2021-08-19 ENCOUNTER — Other Ambulatory Visit: Payer: Self-pay

## 2021-08-19 VITALS — BP 119/74 | HR 88 | Temp 98.4°F | Ht 60.0 in

## 2021-08-19 DIAGNOSIS — R399 Unspecified symptoms and signs involving the genitourinary system: Secondary | ICD-10-CM | POA: Diagnosis not present

## 2021-08-19 LAB — POCT URINALYSIS DIPSTICK
Glucose, UA: NEGATIVE
Nitrite, UA: POSITIVE
Protein, UA: POSITIVE — AB
Spec Grav, UA: >=1.03 — AB (ref 1.010–1.025)
Urobilinogen, UA: 2 E.U./dL — AB
pH, UA: 6.5 (ref 5.0–8.0)

## 2021-08-19 MED ORDER — NITROFURANTOIN MONOHYD MACRO 100 MG PO CAPS: 100.0000 mg | ORAL_CAPSULE | Freq: Two times a day (BID) | ORAL | 0 refills | Status: AC

## 2021-08-19 NOTE — Progress Notes (Signed)
Urine drop off , did not see pt.   [name], CNM

## 2021-08-19 NOTE — Telephone Encounter (Signed)
Informed pt results may take around 24 hours but we did send Rx in the meantime and will FU with results.

## 2021-08-19 NOTE — Telephone Encounter (Signed)
Patient called asking if there were results from her urine drop off earlier today. Please Advise.

## 2021-08-25 LAB — URINE CULTURE

## 2021-09-02 ENCOUNTER — Ambulatory Visit (INDEPENDENT_AMBULATORY_CARE_PROVIDER_SITE_OTHER): Payer: BC Managed Care – PPO | Admitting: Certified Nurse Midwife

## 2021-09-02 ENCOUNTER — Other Ambulatory Visit: Payer: Self-pay

## 2021-09-02 ENCOUNTER — Encounter: Payer: Self-pay | Admitting: Certified Nurse Midwife

## 2021-09-02 VITALS — BP 122/75 | HR 68 | Ht 60.0 in | Wt 153.8 lb

## 2021-09-02 DIAGNOSIS — R102 Pelvic and perineal pain: Secondary | ICD-10-CM

## 2021-09-02 DIAGNOSIS — Z30431 Encounter for routine checking of intrauterine contraceptive device: Secondary | ICD-10-CM

## 2021-09-02 IMAGING — US US OB LIMITED
1 series · 14 of 20 positions shown · non-contrast
Comparison: none

CLINICAL DATA: 22-year-old pregnant female with fall and abdominal
pain.

EXAM:
LIMITED OBSTETRIC ULTRASOUND

[Series 1: us ob limited · 14 of 20 slices shown]
[im 1/20]
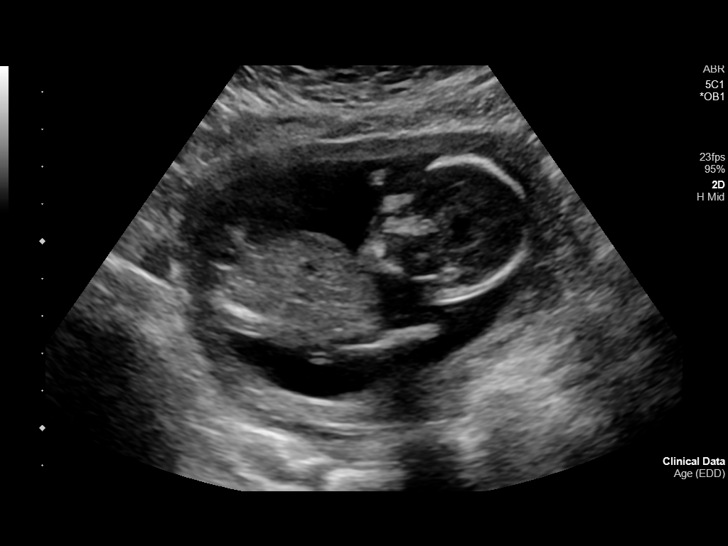
[im 3/20]
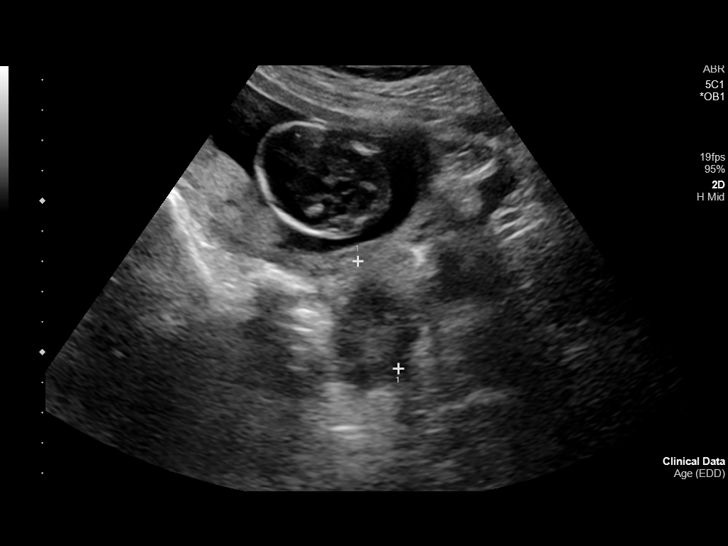
[im 4/20]
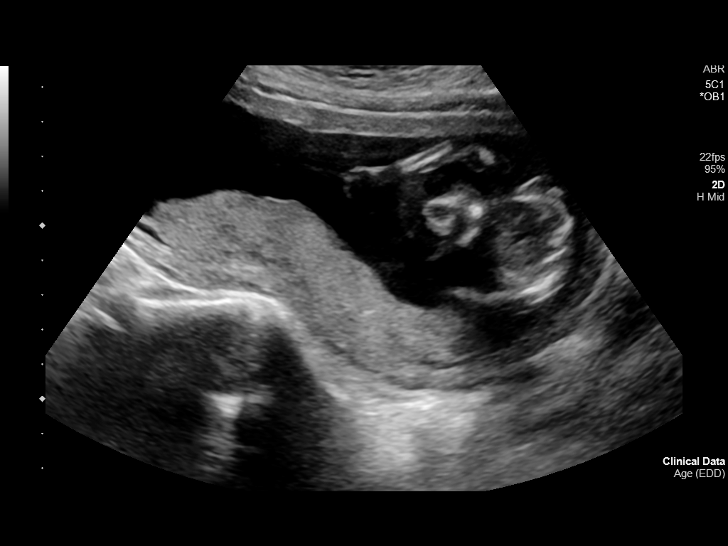
[im 6/20]
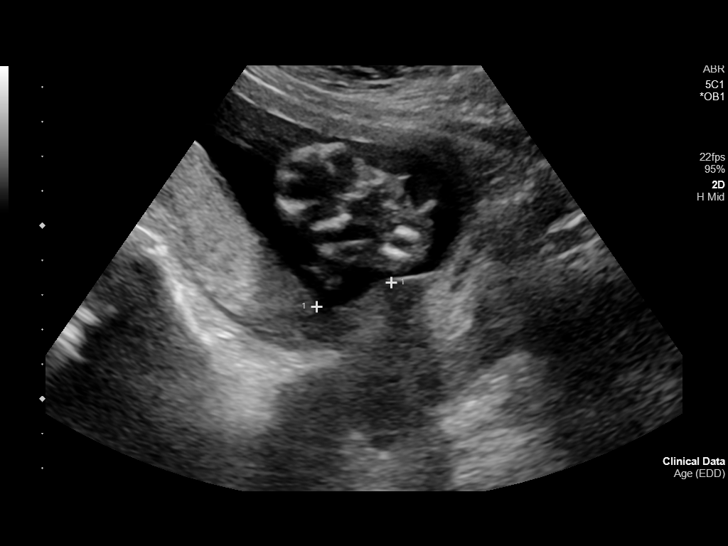
[im 7/20]
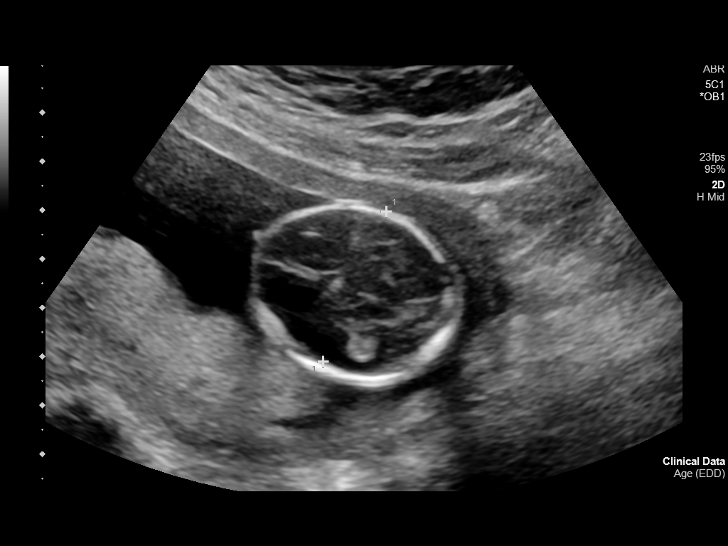
[im 8/20]
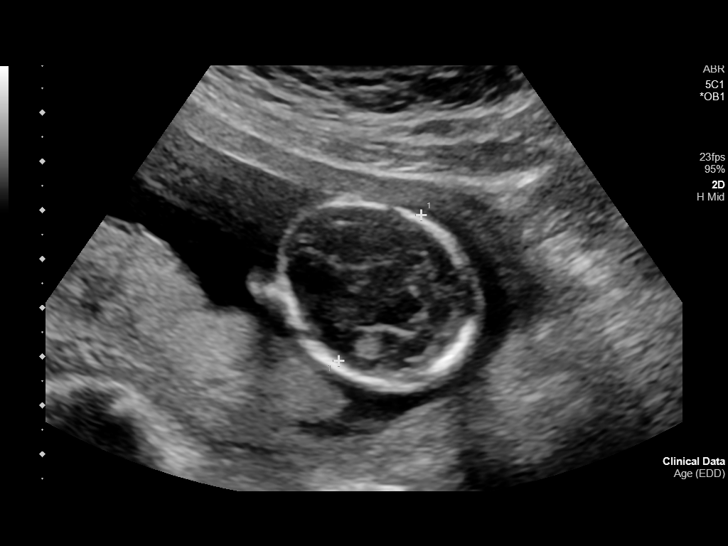
[im 10/20]
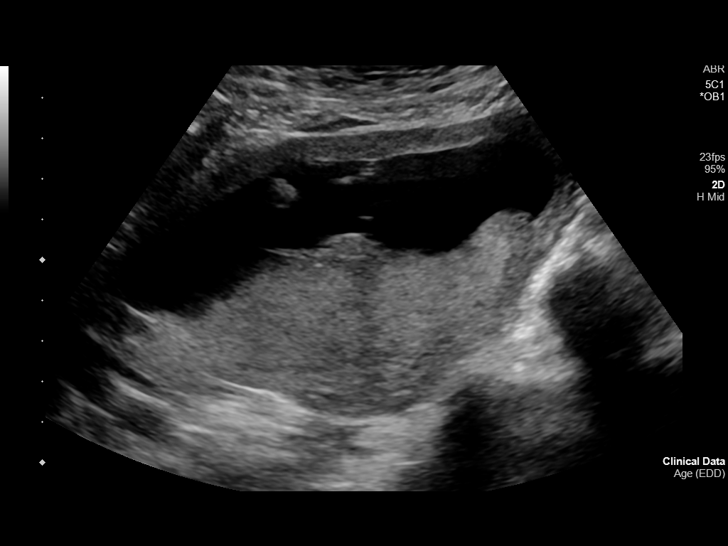
[im 11/20]
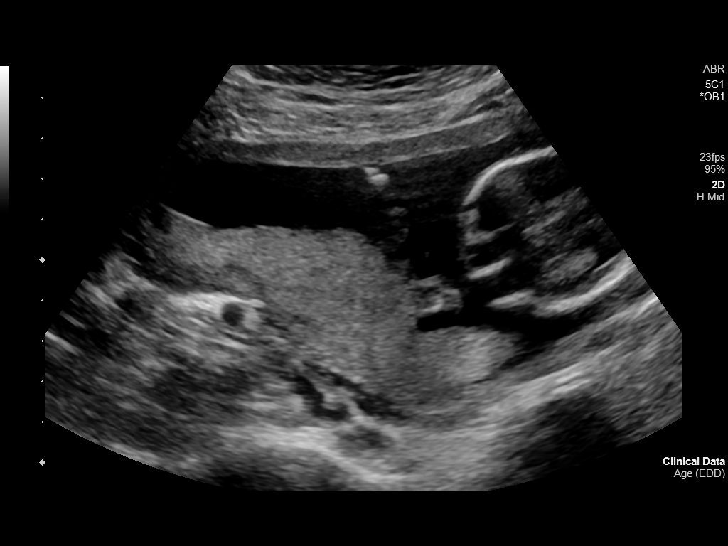
[im 13/20]
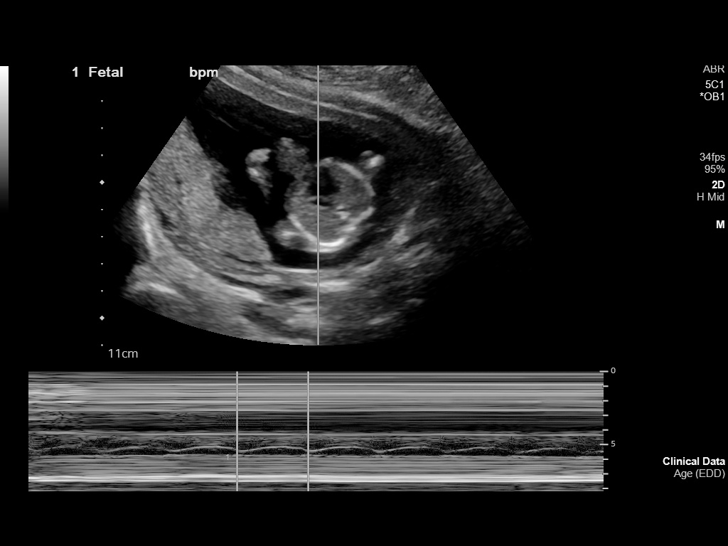
[im 14/20]
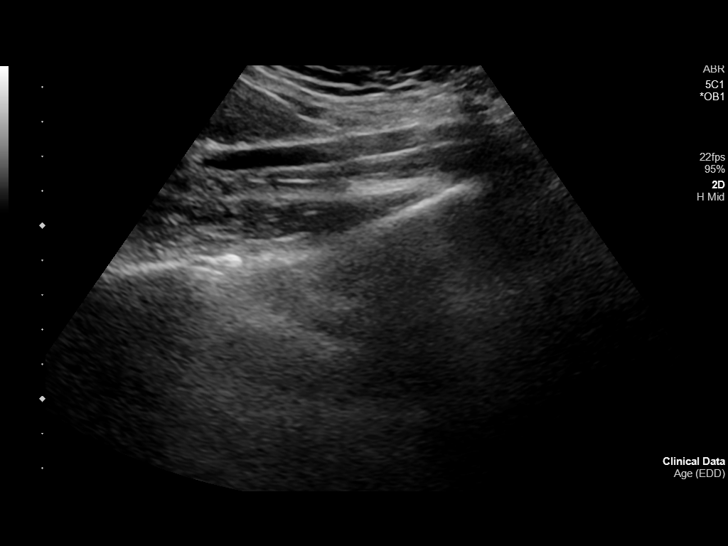
[im 16/20]
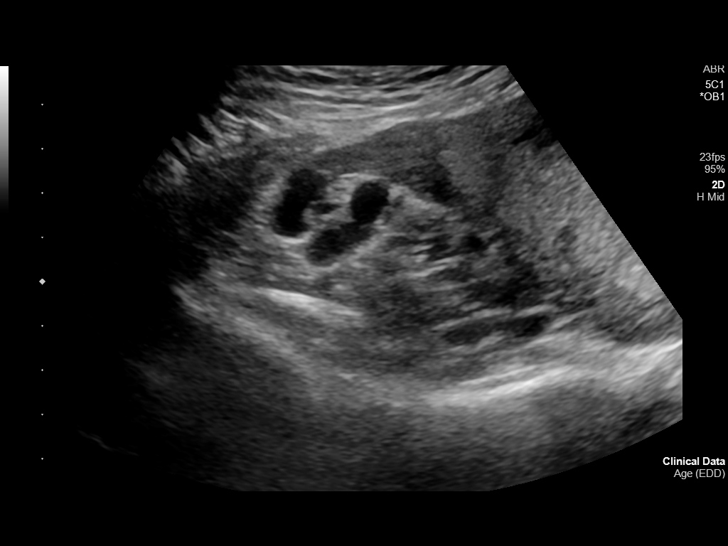
[im 17/20]
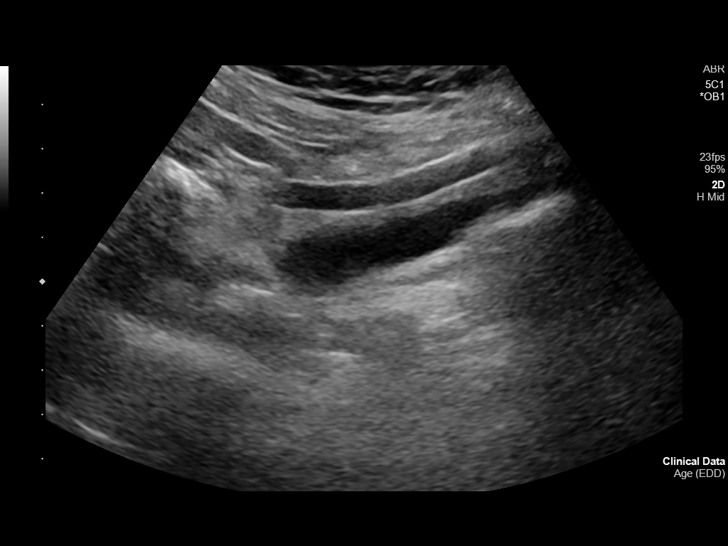
[im 18/20]
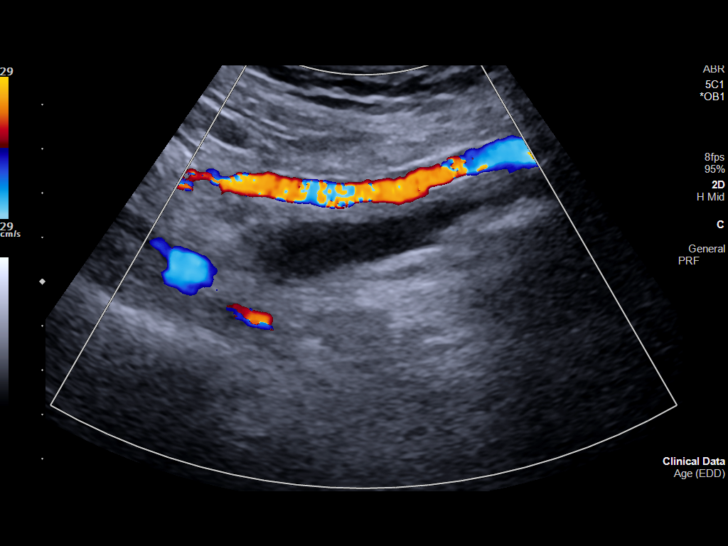
[im 20/20]
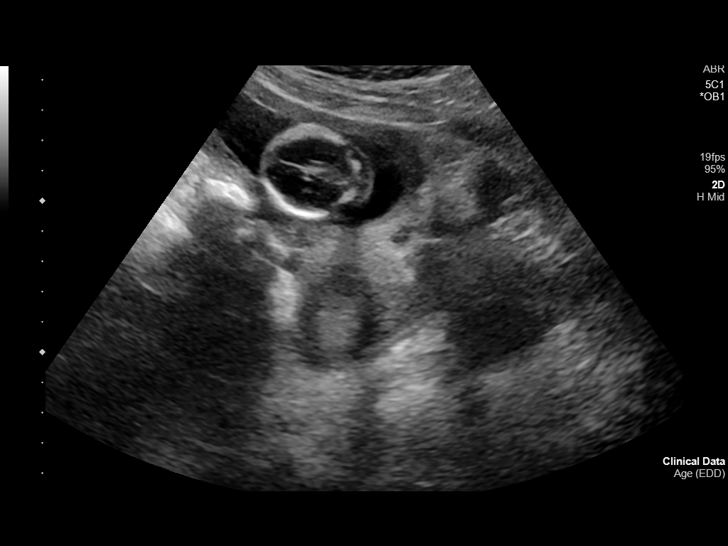

[14 of 20 positions shown; findings below may reference images not displayed]

FINDINGS: Number of Fetuses: 1

Heart Rate:  44 bpm

Movement: Detected

Presentation: Variable

Placental Location: Posterior

Previa: None

Amniotic Fluid (Subjective):  Within normal limits.

BPD: 3.4 cm 16 w  3 d

MATERNAL FINDINGS:

Cervix:  Appears closed.

Uterus/Adnexae: No abnormality visualized.
IMPRESSION: Single live intrauterine pregnancy with an estimated gestational age
of 16 weeks, 3 days based on biparietal diameter. No acute findings.

This exam is performed on an emergent basis and does not
comprehensively evaluate fetal size, dating, or anatomy; follow-up
complete OB US should be considered if further fetal assessment is
warranted.

## 2021-09-02 NOTE — Progress Notes (Signed)
GYN ENCOUNTER NOTE  Subjective:       Alexandra Joseph is a 25 y.o. G80P1001 female is here for gynecologic evaluation of the following issues:  1. Pelvic pain , heavy period last month with IUD in place. PT state she has not been able to feel her strings and is worried that IUD is not in correct place. .     Gynecologic History No LMP recorded. (Menstrual status: IUD). Contraception: IUD Last Pap: 09/13/19. Results were: normal Last mammogram: n/a .   Obstetric History OB History  Gravida Para Term Preterm AB Living  2 1 1     1   SAB IAB Ectopic Multiple Live Births          1    # Outcome Date GA Lbr Len/2nd Weight Sex Delivery Anes PTL Lv  2 Gravida           1 Term 07/23/17 [redacted]w[redacted]d  7 lb 10 oz (3.459 kg) M  EPI N LIV     Complications: Other Excessive Bleeding    Obstetric Comments  Retained placenta and D&C 1 day PP, anemia resulted with blood transfused.    Past Medical History:  Diagnosis Date   Anemia    Anxiety    Costochondritis    Depression    Menorrhagia    Migraine    Tachycardia     Past Surgical History:  Procedure Laterality Date   ADENOIDECTOMY     DILATION AND EVACUATION N/A 07/23/2017   Procedure: DILATATION AND EVACUATION;  Surgeon: 09/22/2017, MD;  Location: [facility] ORS;  Service: Gynecology;  Laterality: N/A;   TONSILLECTOMY      Current Outpatient Medications on File Prior to Visit  Medication Sig Dispense Refill   sertraline (ZOLOFT) 50 MG tablet Take 50 mg by mouth daily.     ondansetron (ZOFRAN) 4 MG tablet Take 1 tablet (4 mg total) by mouth every 8 (eight) hours as needed for nausea or vomiting. (Patient not taking: Reported on 09/02/2021) 20 tablet 0   No current facility-administered medications on file prior to visit.    No Known Allergies  Social History   Socioeconomic History   Marital status: Married    Spouse name: 09/04/2021   Number of children: 1   Years of education: Not on file   Highest education level: Not on  file  Occupational History   Not on file  Tobacco Use   Smoking status: Never   Smokeless tobacco: Never  Vaping Use   Vaping Use: Never used  Substance and Sexual Activity   Alcohol use: No   Drug use: No   Sexual activity: Not Currently  Other Topics Concern   Not on file  Social History Narrative   Lives in [place]; work at hospital at [employer]; no smoking; no alcohol.    Social Determinants of Health   Financial Resource Strain: Not on file  Food Insecurity: Not on file  Transportation Needs: Not on file  Physical Activity: Not on file  Stress: Not on file  Social Connections: Not on file  Intimate Partner Violence: Not on file    Family History  Problem Relation Age of Onset   Hypertension Mother    Arrhythmia Mother        palpitations   Hyperlipidemia Mother    Heart attack Maternal Uncle 42   Heart attack Maternal Grandmother    Heart disease Maternal Grandmother        CABG   Stroke Maternal  Grandmother    Osteoarthritis Maternal Grandmother    Hypertension Maternal Grandfather    Stroke Maternal Grandfather     The following portions of the patient's history were reviewed and updated as appropriate: allergies, current medications, past family history, past medical history, past social history, past surgical history and problem list.  Review of Systems Review of Systems - Negative except as mentioned in HPI Review of Systems - General ROS: negative for - chills, fatigue, fever, hot flashes, malaise or night sweats Hematological and Lymphatic ROS: negative for - bleeding problems or swollen lymph nodes Gastrointestinal ROS: negative for - abdominal pain, blood in stools, change in bowel habits and nausea/vomiting Musculoskeletal ROS: negative for - joint pain, muscle pain or muscular weakness Genito-Urinary ROS: negative for - change in menstrual cycle, dysmenorrhea, dyspareunia, dysuria, genital discharge, genital ulcers, hematuria, incontinence,  irregular/heavy menses, nocturia . Positive for pelvic pain, heavy period x 1   Objective:   BP 122/75   Pulse 68   Ht 5' (1.524 m)   Wt 153 lb 12.8 oz (69.8 kg)   BMI 30.04 kg/m  CONSTITUTIONAL: Well-developed, well-nourished female in no acute distress.  HENT:  Normocephalic, atraumatic.  NECK: Normal range of motion, supple, no masses.  Normal thyroid.  SKIN: Skin is warm and dry. No rash noted. Not diaphoretic. No erythema. No pallor. NEUROLGIC: Alert and oriented to person, place, and time. PSYCHIATRIC: Normal mood and affect. Normal behavior. Normal judgment and thought content. CARDIOVASCULAR:Not Examined RESPIRATORY: Not Examined BREASTS: Not Examined ABDOMEN: Soft, non distended; Non tender.  No Organomegaly. PELVIC:  External Genitalia: Normal  BUS: Normal  Vagina: Normal  Cervix: Normal, strings present, small amount of blood present  Uterus: Normal size, shape,consistency, mobile, tender on palpation  Adnexa: Normal  RV: Normal   Bladder: Nontender MUSCULOSKELETAL: Normal range of motion. No tenderness.  No cyanosis, clubbing, or edema.     Assessment:   Pelvic pain  IUD    Plan:   U/s ordered to verify IUD is in correct position in the uterus. Pt encouraged to use tylenol and or motrin for pain as needed. Will follow up with results.   [name], CNM

## 2021-09-23 ENCOUNTER — Other Ambulatory Visit: Payer: Self-pay | Admitting: Certified Nurse Midwife

## 2021-09-23 ENCOUNTER — Other Ambulatory Visit: Payer: Self-pay

## 2021-09-23 ENCOUNTER — Ambulatory Visit (INDEPENDENT_AMBULATORY_CARE_PROVIDER_SITE_OTHER): Payer: BC Managed Care – PPO

## 2021-09-23 DIAGNOSIS — Z30431 Encounter for routine checking of intrauterine contraceptive device: Secondary | ICD-10-CM | POA: Diagnosis not present

## 2021-09-23 DIAGNOSIS — R102 Pelvic and perineal pain: Secondary | ICD-10-CM | POA: Diagnosis not present

## 2021-09-29 ENCOUNTER — Telehealth: Payer: Self-pay | Admitting: Certified Nurse Midwife

## 2021-09-29 NOTE — Telephone Encounter (Signed)
Pt is calling in wanting to know what her [place] results.  Pt is aware that they have not been resulted as of today and someone from the office will give her all once they have.

## 2021-09-30 ENCOUNTER — Encounter: Payer: BC Managed Care – PPO | Admitting: Certified Nurse Midwife

## 2021-10-15 ENCOUNTER — Encounter: Payer: Self-pay | Admitting: Certified Nurse Midwife

## 2021-10-15 ENCOUNTER — Ambulatory Visit (INDEPENDENT_AMBULATORY_CARE_PROVIDER_SITE_OTHER): Payer: BC Managed Care – PPO | Admitting: Certified Nurse Midwife

## 2021-10-15 ENCOUNTER — Other Ambulatory Visit: Payer: Self-pay

## 2021-10-15 VITALS — BP 106/70 | HR 76 | Ht 60.0 in | Wt 154.0 lb

## 2021-10-15 DIAGNOSIS — Z30433 Encounter for removal and reinsertion of intrauterine contraceptive device: Secondary | ICD-10-CM | POA: Diagnosis not present

## 2021-10-15 NOTE — Progress Notes (Signed)
   GYNECOLOGY OFFICE PROCEDURE NOTE  Alexandra Joseph is a 25 y.o. G2P1001 here for mirena IUD removal and reinsertion. The mirena was found to be in the fundal segment of the endometrium, however anteroposterior positioning is suspicious for early migration or imbedding.    IUD Removal and Reinsertion  Patient identified, informed consent performed, consent signed.   Discussed risks of irregular bleeding, cramping, infection, malpositioning or misplacement of the IUD outside the uterus which may require further procedures. Also discussed >99% contraception efficacy, increased risk of ectopic pregnancy with failure of method.   Emphasized that this did not protect against STIs, condoms recommended during all sexual encounters.Advised to use backup contraception for one week as the risk of pregnancy is higher during the transition period of removing an IUD and replacing it with another one. Time out was performed. Speculum placed in the vagina. The strings of the IUD were grasped and pulled using ring forceps. The IUD was successfully removed in its entirety. The cervix was cleaned with Betadine x 2 and grasped anteriorly with a single tooth tenaculum.  The new mirena IUD insertion apparatus was used to sound the uterus to 8 cm;  the IUD was then placed per manufacturer's recommendations. Strings trimmed to 3 cm. Tenaculum was removed, good hemostasis noted. Patient tolerated procedure well.   Patient was given post-procedure instructions.  She was reminded to have backup contraception for one week during this transition period between IUDs.  Patient was also asked to check IUD strings periodically and follow up in 4 weeks for IUD check.   [name], CNM

## 2021-10-15 NOTE — Patient Instructions (Signed)

## 2021-10-28 ENCOUNTER — Telehealth: Payer: Self-pay | Admitting: Certified Nurse Midwife

## 2021-10-28 NOTE — Telephone Encounter (Signed)
Pt called stating that she had IUD replaced on 11-30 she started having severe pain and pressure in uterus. Pt states that it hurts worse when she walks. Pt denies any intercourse in past few days. Please Advise.

## 2021-10-28 NOTE — Telephone Encounter (Signed)
LM for pt to call back.

## 2021-11-11 ENCOUNTER — Ambulatory Visit (INDEPENDENT_AMBULATORY_CARE_PROVIDER_SITE_OTHER): Payer: BC Managed Care – PPO | Admitting: Certified Nurse Midwife

## 2021-11-11 ENCOUNTER — Other Ambulatory Visit: Payer: Self-pay

## 2021-11-11 ENCOUNTER — Encounter: Payer: Self-pay | Admitting: Certified Nurse Midwife

## 2021-11-11 VITALS — BP 114/73 | HR 74 | Ht 60.0 in | Wt 153.9 lb

## 2021-11-11 DIAGNOSIS — Z30431 Encounter for routine checking of intrauterine contraceptive device: Secondary | ICD-10-CM

## 2021-11-11 LAB — POCT URINE PREGNANCY: Preg Test, Ur: NEGATIVE

## 2021-11-11 NOTE — Progress Notes (Signed)
° ° °  GYNECOLOGY OFFICE ENCOUNTER NOTE  History:  25 y.o. G2P1001 here today for today for IUD string check; Mirena  IUD was placed 10/15/21 . Complains of nausea daily in the morning and cramping. States she was bleeding up until yesterday.   The following portions of the patient's history were reviewed and updated as appropriate: allergies, current medications, past family history, past medical history, past social history, past surgical history and problem list.  Review of Systems:  Pertinent items are noted in HPI.   Objective:  Physical Exam Blood pressure 114/73, pulse 74, height 5' (1.524 m), weight 153 lb 14.4 oz (69.8 kg). CONSTITUTIONAL: Well-developed, well-nourished female in no acute distress.  NEUROLOGIC: Alert and oriented to person, place, and time. Normal reflexes, muscle tone coordination.  PSYCHIATRIC: Normal mood and affect. Normal behavior. Normal judgment and thought content. CARDIOVASCULAR: Normal heart rate noted RESPIRATORY: Effort and breath sounds normal, no problems with respiration noted ABDOMEN: Soft, no distention noted.   PELVIC: Normal appearing external genitalia; normal appearing vaginal mucosa and cervix.  IUD strings visualized, about 3 cm in length outside cervix.   Assessment & Plan:  U/s ordered for placement due to cramping. Patient to keep IUD in place for up to seven years; can come in for removal earlier if she desires or for any concerning side effects. Recommended condoms for STI prevention.   [name], CNM

## 2021-11-20 ENCOUNTER — Other Ambulatory Visit: Payer: Self-pay

## 2021-11-20 ENCOUNTER — Encounter: Payer: Self-pay | Admitting: Certified Nurse Midwife

## 2021-11-20 ENCOUNTER — Ambulatory Visit (INDEPENDENT_AMBULATORY_CARE_PROVIDER_SITE_OTHER): Payer: BC Managed Care – PPO

## 2021-11-20 DIAGNOSIS — Z30431 Encounter for routine checking of intrauterine contraceptive device: Secondary | ICD-10-CM | POA: Diagnosis not present

## 2021-11-23 ENCOUNTER — Encounter: Payer: Self-pay | Admitting: Certified Nurse Midwife

## 2021-12-02 ENCOUNTER — Other Ambulatory Visit: Payer: BC Managed Care – PPO

## 2022-01-14 ENCOUNTER — Encounter: Payer: BC Managed Care – PPO | Admitting: Certified Nurse Midwife

## 2022-01-26 ENCOUNTER — Other Ambulatory Visit (HOSPITAL_COMMUNITY)
Admission: RE | Admit: 2022-01-26 | Discharge: 2022-01-26 | Disposition: A | Discharge status: Home / Self Care | Payer: BC Managed Care – PPO | Source: Ambulatory Visit | Attending: Certified Nurse Midwife | Admitting: Certified Nurse Midwife

## 2022-01-26 ENCOUNTER — Other Ambulatory Visit: Payer: Self-pay

## 2022-01-26 ENCOUNTER — Encounter: Payer: Self-pay | Admitting: Certified Nurse Midwife

## 2022-01-26 ENCOUNTER — Ambulatory Visit (INDEPENDENT_AMBULATORY_CARE_PROVIDER_SITE_OTHER): Payer: BC Managed Care – PPO | Admitting: Certified Nurse Midwife

## 2022-01-26 VITALS — BP 113/74 | HR 73 | Ht 60.0 in | Wt 151.8 lb

## 2022-01-26 DIAGNOSIS — N898 Other specified noninflammatory disorders of vagina: Secondary | ICD-10-CM

## 2022-01-26 NOTE — Patient Instructions (Signed)
Skin Biopsy, Care After °The following information offers guidance on how to care for yourself after your procedure. Your health care provider may also give you more specific instructions. If you have problems or questions, contact your health care provider. °What can I expect after the procedure? °After the procedure, it is common to have: °Soreness or mild pain. °Bruising. °Itching. °Some redness and swelling. °Follow these instructions at home: °Biopsy site care ° °Follow instructions from your health care provider about how to take care of your biopsy site. Make sure you: °Wash your hands with soap and water for at least 20 seconds before and after you change your bandage (dressing). If soap and water are not available, use hand sanitizer. °Change your dressing as told by your health care provider. °Leave stitches (sutures), skin glue, or adhesive strips in place. These skin closures may need to stay in place for 2 weeks or longer. If adhesive strip edges start to loosen and curl up, you may trim the loose edges. Do not remove adhesive strips completely unless your health care provider tells you to do that. °Check your biopsy site every day for signs of infection. Check for: °More redness, swelling, or pain. °Fluid or blood. °Warmth. °Pus or a bad smell. °Do not take baths, swim, or use a hot tub until your health care provider approves. Ask your health care provider if you may take showers. You may only be allowed to take sponge baths. °General instructions °Take over-the-counter and prescription medicines only as told by your health care provider. °Return to your normal activities as told by your health care provider. Ask your health care provider what activities are safe for you. °Keep all follow-up visits. This is important. °Contact a health care provider if: °You have more redness, swelling, or pain around your biopsy site. °You have fluid or blood coming from your biopsy site. °Your biopsy site feels warm  to the touch. °You have pus or a bad smell coming from your biopsy site. °You have a fever. °Your sutures, skin glue, or adhesive strips loosen or come off sooner than expected. °Get help right away if: °You have bleeding that does not stop with pressure or a dressing. °Summary °After the procedure, it is common to have soreness, bruising, and itching at the site. °Follow instructions from your health care provider about how to take care of your biopsy site. °Check your biopsy site every day for signs of infection. °Contact a health care provider if you have more redness, swelling, or pain around your biopsy site, or your biopsy site feels warm to the touch. °Keep all follow-up visits. This is important. °This information is not intended to replace advice given to you by your health care provider. Make sure you discuss any questions you have with your health care provider. °Document Revised: 06/03/2021 Document Reviewed: 06/03/2021 °Elsevier Patient Education © 2022 [employer] ° °

## 2022-01-26 NOTE — Progress Notes (Signed)
GYN ENCOUNTER NOTE ? ?Subjective:  ?    ? Alexandra Joseph is a 26 y.o. G4P1001 female is here for gynecologic evaluation of the following issues:  ?1. Vaginal bumps that appeared a few months ago. States she had some itching that she used some monistat for but that it did not help so she stopped taking it. She also used a summers eve. She deneis concern for STD but agrees to testing for HSV.   ?  ?Gynecologic History ?No LMP recorded (lmp unknown). (Menstrual status: IUD). ?Contraception: IUD ?Last Pap: 09/13/2019. Results were: normal ?Last mammogram: n/a  ? ?Obstetric History ?OB History  ?Gravida Para Term Preterm AB Living  ?2 1 1     1   ?SAB IAB Ectopic Multiple Live Births  ?        1  ?  ?# Outcome Date GA Lbr Len/2nd Weight Sex Delivery Anes PTL Lv  ?2 Gravida           ?1 Term 07/23/17 [redacted]w[redacted]d  7 lb 10 oz (3.459 kg) M  EPI N LIV  ?   Complications: Other Excessive Bleeding  ?  ?Obstetric Comments  ?Retained placenta and D&C 1 day PP, anemia resulted with blood transfused.  ? ? ?Past Medical History:  ?Diagnosis Date  ? Anemia   ? Anxiety   ? Costochondritis   ? Depression   ? Menorrhagia   ? Migraine   ? Tachycardia   ? ? ?Past Surgical History:  ?Procedure Laterality Date  ? ADENOIDECTOMY    ? DILATION AND EVACUATION N/A 07/23/2017  ? Procedure: DILATATION AND EVACUATION;  Surgeon: 09/22/2017, MD;  Location: [facility] ORS;  Service: Gynecology;  Laterality: N/A;  ? TONSILLECTOMY    ? ? ?Current Outpatient Medications on File Prior to Visit  ?Medication Sig Dispense Refill  ? ondansetron (ZOFRAN) 4 MG tablet Take 1 tablet (4 mg total) by mouth every 8 (eight) hours as needed for nausea or vomiting. 20 tablet 0  ? sertraline (ZOLOFT) 50 MG tablet Take 50 mg by mouth daily.    ? ?No current facility-administered medications on file prior to visit.  ? ? ?No Known Allergies ? ?Social History  ? ?Socioeconomic History  ? Marital status: Married  ?  Spouse name: Linzie Collin  ? Number of children: 1  ? Years of  education: Not on file  ? Highest education level: Not on file  ?Occupational History  ? Not on file  ?Tobacco Use  ? Smoking status: Never  ? Smokeless tobacco: Never  ?Vaping Use  ? Vaping Use: Never used  ?Substance and Sexual Activity  ? Alcohol use: No  ? Drug use: No  ? Sexual activity: Not Currently  ?Other Topics Concern  ? Not on file  ?Social History Narrative  ? Lives in [place]; work at hospital at [employer]; no smoking; no alcohol.   ? ?Social Determinants of Health  ? ?Financial Resource Strain: Not on file  ?Food Insecurity: Not on file  ?Transportation Needs: Not on file  ?Physical Activity: Not on file  ?Stress: Not on file  ?Social Connections: Not on file  ?Intimate Partner Violence: Not on file  ? ? ?Family History  ?Problem Relation Age of Onset  ? Hypertension Mother   ? Arrhythmia Mother   ?     palpitations  ? Hyperlipidemia Mother   ? Heart attack Maternal Uncle 42  ? Heart attack Maternal Grandmother   ? Heart disease Maternal Grandmother   ?  CABG  ? Stroke Maternal Grandmother   ? Osteoarthritis Maternal Grandmother   ? Hypertension Maternal Grandfather   ? Stroke Maternal Grandfather   ? ? ?The following portions of the patient's history were reviewed and updated as appropriate: allergies, current medications, past family history, past medical history, past social history, past surgical history and problem list. ? ?Review of Systems ?Review of Systems - Negative except as mentioned in HPI ?Review of Systems - General ROS: negative for - chills, fatigue, fever, hot flashes, malaise or night sweats ?Hematological and Lymphatic ROS: negative for - bleeding problems or swollen lymph nodes ?Gastrointestinal ROS: negative for - abdominal pain, blood in stools, change in bowel habits and nausea/vomiting ?Musculoskeletal ROS: negative for - joint pain, muscle pain or muscular weakness ?Genito-Urinary ROS: negative for - change in menstrual cycle, dysmenorrhea, dyspareunia, dysuria, genital  discharge, hematuria, incontinence, irregular/heavy menses, nocturia or pelvic pain. Positive for labial bumps, itching, some burning pain  ? ?Objective:  ? ?BP 113/74   Pulse 73   Ht 5' (1.524 m)   Wt 151 lb 12.8 oz (68.9 kg)   LMP  (LMP Unknown)   BMI 29.65 kg/m?  ?CONSTITUTIONAL: Well-developed, well-nourished female in no acute distress.  ?HENT:  Normocephalic, atraumatic.  ?NECK: Normal range of motion, supple, no masses.  Normal thyroid.  ?SKIN: Skin is warm and dry. No rash noted. Not diaphoretic. No erythema. No pallor. ?NEUROLGIC: Alert and oriented to person, place, and time. PSYCHIATRIC: Normal mood and affect. Normal behavior. Normal judgment and thought content. ?CARDIOVASCULAR:Not Examined ?RESPIRATORY: Not Examined ?BREASTS: Not Examined ?ABDOMEN: Soft, non distended; Non tender.  No Organomegaly. ?PELVIC: ? External Genitalia: Normal ? BUS: Normal ? Vagina: Normal Righ labia 3 small pin point bumps noted, No drainage, noted. Sizw of pimple. Not painful to touch  ?  ?MUSCULOSKELETAL: Normal range of motion. No tenderness.  No cyanosis, clubbing, or edema. ? ? ?Assessment:  ? ?Labial bumps  ? ? ?Plan:  ? ?Punch Biopsy Procedure Note ? ?Pre-operative Diagnosis: Suspicious lesion right labial  ? ?Post-operative Diagnosis: normal ? ?Locations:right labia  ? ?Indications: suspicious lesion ? ?Anesthesia: Lidocaine 1% without epinephrine without added sodium bicarbonate ? ?Procedure Details  ?History of allergy to iodine: no ?Patient informed of the risks (including bleeding and infection) and benefits of the  ?procedure and Verbal informed consent obtained. ? ?The lesion and surrounding area was given a sterile prep using betadyne and draped in the usual sterile fashion. The skin was then stretched perpendicular to the skin tension lines and the lesion removed using the 3 mm punch. The resulting ellipse was then closed. Hemostatic not in need of stich. Antibiotic ointment and a sterile dressing  applied. The specimen was sent for pathologic examination. The patient tolerated the procedure well. ? ?EBL: 0.5 ml ? ?Condition: ?Stable ? ?Complications: ?none. ? ?Plan: ?1. Instructed to keep the wound dry and covered for 24-48h and clean thereafter. ?2. Warning signs of infection were reviewed.   ?3. Testing HSV.  ?4. Discussed possible allergic irritation due to use of monistat and summers eve. If biopsy negative , HSV negative will consider steroid topical cream.  ? ?Pt verbalizes and agrees to plan .  ? ? ?

## 2022-01-27 ENCOUNTER — Encounter: Payer: Self-pay | Admitting: Certified Nurse Midwife

## 2022-01-27 LAB — SURGICAL PATHOLOGY

## 2022-01-27 LAB — HSV(HERPES SIMPLEX VRS) I + II AB-IGG
HSV 1 Glycoprotein G Ab, IgG: 2.85 index — ABNORMAL HIGH (ref 0.00–0.90)
HSV 2 IgG, Type Spec: <0.91 index (ref 0.00–0.90)

## 2022-01-28 ENCOUNTER — Encounter: Payer: Self-pay | Admitting: Certified Nurse Midwife

## 2022-02-16 NOTE — Progress Notes (Signed)
? ? ? ?  GYNECOLOGY OFFICE COLPOSCOPY PROCEDURE NOTE ? ?26 y.o. 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     ? ?Patient gave informed written consent, time out was performed.  Placed in lithotomy position. Vulva viewed with colposcope after application of acetic acid.  ? ?Colposcopy adequate? Yes ? No acetowhite lesions.  Small wart noted on left inner thigh; no biopsy obtained as patient desires to wait.  ? ? ?Chaperone was present during entire procedure. ? ?Patient was given post procedure instructions.  Will follow up pathology and manage accordingly; patient will be contacted with results and recommendations.  Routine preventative health maintenance measures emphasized. ? ? ? ?Hildred Laser, MD ?[facility] ? ? ?

## 2022-02-17 ENCOUNTER — Encounter: Payer: Self-pay | Admitting: Obstetrics and Gynecology

## 2022-02-17 ENCOUNTER — Ambulatory Visit (INDEPENDENT_AMBULATORY_CARE_PROVIDER_SITE_OTHER): Payer: BC Managed Care – PPO | Admitting: Obstetrics and Gynecology

## 2022-02-17 VITALS — BP 106/55 | HR 81 | Resp 16 | Wt 143.9 lb

## 2022-02-17 DIAGNOSIS — N890 Mild vaginal dysplasia: Secondary | ICD-10-CM

## 2022-02-17 NOTE — Patient Instructions (Addendum)
Vulvar Intraepithelial Neoplasia ? ?What Is Vulvar Intraepithelial Neoplasia? ?Vulvar intraepithelial neoplasia, or VIN, is a precancerous skin condition on the vulva. It occurs when there are changes in the cells of the skin covering the vulva. VIN is not cancer. However, if the changes become more severe, cancer of the vulva may develop after many years. Also known as dysplasia, VIN can range from mild to severe. ? ?What Causes Vin? ?The exact cause is not known, however it has been linked to: ?Human papillomavirus (HPV), a common infection ?Herpes simplex virus ?Granuloma inguinale, a sexually transmitted disease (STD) ?Smoking ?Immunosuppression ?Chronic vulvar irritation ?What Are the Symptoms of VIN? ? ?Symptoms vary but may include: ?Chronic vulvar itching ?Burning, tingling or soreness in the vulva area ?Change in appearance of the affected area, including areas of redness or white, discolored skin ?Slightly raised skin lesions; some may appear darkened like a mole or freckle ?Pain during sex ?In rare cases, there may be no symptoms initially. ? ?How Is Vin Diagnosed? ?Diagnosis may include: ?A physical exam ?A colposcopy, an office procedure that uses a special microscope (colposcope) to closely examine the tissue for abnormal areas ?A biopsy of the affected area. A local anesthetic is given to numb the area. A small sample of vulva tissue is then taken and the cells are examined under a microscope. ? ?How Can I Prevent Vin? ?Lifestyle habits that may help prevent VIN or detect it early include: ?Practicing safe sex ?Vaccinating girls with the HPV vaccine before their first sexual contact ?Regular exams for early detection and treatment, preventing cancer of the vulva from developing ?How Is Vin Treated? ? ?Treatment varies depending on how abnormal the skin cells are, the size of the affected area, and the estimated risk of the area developing into vulvar cancer. Treatment options include: ? ?Steroid cream  to reduce inflammation and control symptoms, with close monitoring ?Surgery to remove the abnormal tissue ?Laser therapy to destroy targeted areas of abnormal cells, using a beam of light ?Diathermy, which uses a tiny electrical current to cut out the affected areas ?Topical chemotherapy cream to remove abnormal cells ?Vulvectomy, removal of the whole vulva, in rare cases where the affected areas are very large ? ?VIN can recur after treatment, therefore regular follow-up appointments are highly recommended. ?

## 2022-04-28 ENCOUNTER — Ambulatory Visit (INDEPENDENT_AMBULATORY_CARE_PROVIDER_SITE_OTHER): Payer: BC Managed Care – PPO | Admitting: Obstetrics and Gynecology

## 2022-04-28 ENCOUNTER — Encounter: Payer: Self-pay | Admitting: Obstetrics and Gynecology

## 2022-04-28 VITALS — BP 112/65 | HR 73 | Resp 16 | Ht 60.0 in | Wt 152.6 lb

## 2022-04-28 DIAGNOSIS — N930 Postcoital and contact bleeding: Secondary | ICD-10-CM | POA: Diagnosis not present

## 2022-04-28 DIAGNOSIS — R102 Pelvic and perineal pain: Secondary | ICD-10-CM

## 2022-04-28 DIAGNOSIS — Z975 Presence of (intrauterine) contraceptive device: Secondary | ICD-10-CM | POA: Diagnosis not present

## 2022-04-28 MED ORDER — SLYND 4 MG PO TABS: 1.0000 | ORAL_TABLET | Freq: Every day | ORAL | 3 refills | Status: DC

## 2022-04-28 MED ORDER — IBUPROFEN 600 MG PO TABS: 600.0000 mg | ORAL_TABLET | Freq: Four times a day (QID) | ORAL | 1 refills | Status: DC | PRN

## 2022-04-28 NOTE — Progress Notes (Signed)
    GYNECOLOGY PROGRESS NOTE  Subjective:    Patient ID: Alexandra Joseph, female    DOB: 1996-04-12, 26 y.o.   MRN: [id number]  HPI  Patient is a 26 y.o. G39P1001 female who presents for evaluation of IUD. She had her IUD removed and reinserted on 10/15/2021 ( previous IUD that inserted 05/21/2020 was imbedded in her uterus and had to be removed due to pelvic pain). She has concerns today that the current IUD may be doing the same thing. She has had cramping x 1.5 months as well as heavy bleeding x 2 days.  Notes that she occasionally to Tylenol, however this did not help.  Pain is intermittent, is mostly noted shortly after intercourse and the following day. Also experiences some light bleeding after intercourse.   The following portions of the patient's history were reviewed and updated as appropriate: allergies, current medications, past family history, past medical history, past social history, past surgical history, and problem list.  Review of Systems Pertinent items are noted in HPI.   Objective:   Blood pressure 112/65, pulse 73, resp. rate 16, height 5' (1.524 m), weight 152 lb 9.6 oz (69.2 kg).  Body mass index is 29.8 kg/m. General appearance: alert and no distress Abdomen: soft, non-tender; bowel sounds normal; no masses,  no organomegaly Pelvic: external genitalia normal, rectovaginal septum normal.  Vagina without discharge.  Cervix normal appearing, no lesions and no motion tenderness.  IUD threads visible, ~ 2-3 cm in length. Uterus mobile, nontender, normal shape and size.  Adnexae non-palpable, nontender bilaterally.   Extremities: extremities normal, atraumatic, no cyanosis or edema Neurologic: Grossly normal   Assessment:   1. Pelvic pain   2. IUD (intrauterine device) in place   3. Postcoital bleeding    Plan:    1. Pelvic pain -Discussed that pelvic pain may be related to her IUD.  This is the second IUD that patient has had placed and has experienced pain with  both.  I discussed that at this time it may be best to remove the IUD if she continues to experience pain.  Patient initially okay with plan but at time of exam and attempted removal patient changed her mind.  Would like to try use of NSAIDs as she has only been utilizing Tylenol.  Prescribed 600 mg ibuprofen.  Return to clinic in the next several months if her symptoms have not improved.  2. IUD (intrauterine device) in place -Initially discussed alternative options for birth control if IUD removed.  Patient notes that she would likely return to progesterone OCPs (patient has history of headaches with use of combined OCPs).  3. Postcoital bleeding -Unclear cause, patient appearing exam, no vaginal discharge present.  May be secondary to IUD.  Discussed different sexual positions that decreased depth of penetration.  Resolution, can consider vaginal cultures to rule out occult infection.   Hildred Laser, MD [facility]

## 2022-04-28 NOTE — Addendum Note (Signed)
Addended by: Fabian November on: 04/28/2022 09:41 PM   Modules accepted: Orders

## 2022-04-30 ENCOUNTER — Encounter: Payer: BC Managed Care – PPO | Admitting: Obstetrics and Gynecology

## 2022-05-06 ENCOUNTER — Other Ambulatory Visit: Payer: Self-pay

## 2022-08-25 ENCOUNTER — Encounter: Payer: Self-pay | Admitting: Certified Nurse Midwife

## 2022-08-26 ENCOUNTER — Encounter: Payer: Self-pay | Admitting: Certified Nurse Midwife

## 2022-08-28 ENCOUNTER — Encounter: Payer: Self-pay | Admitting: Certified Nurse Midwife

## 2023-04-22 ENCOUNTER — Ambulatory Visit (INDEPENDENT_AMBULATORY_CARE_PROVIDER_SITE_OTHER): Payer: BC Managed Care – PPO

## 2023-04-22 ENCOUNTER — Other Ambulatory Visit (HOSPITAL_COMMUNITY)
Admission: RE | Admit: 2023-04-22 | Discharge: 2023-04-22 | Disposition: A | Discharge status: Home / Self Care | Payer: BC Managed Care – PPO | Source: Ambulatory Visit | Attending: Obstetrics and Gynecology | Admitting: Obstetrics and Gynecology

## 2023-04-22 VITALS — BP 105/68 | HR 86 | Ht 60.0 in | Wt 153.6 lb

## 2023-04-22 DIAGNOSIS — R300 Dysuria: Secondary | ICD-10-CM | POA: Diagnosis present

## 2023-04-22 DIAGNOSIS — N760 Acute vaginitis: Secondary | ICD-10-CM | POA: Insufficient documentation

## 2023-04-22 DIAGNOSIS — N898 Other specified noninflammatory disorders of vagina: Secondary | ICD-10-CM | POA: Insufficient documentation

## 2023-04-22 DIAGNOSIS — R82998 Other abnormal findings in urine: Secondary | ICD-10-CM

## 2023-04-22 LAB — POCT URINALYSIS DIPSTICK
Bilirubin, UA: NEGATIVE
Blood, UA: NEGATIVE
Glucose, UA: NEGATIVE
Ketones, UA: NEGATIVE
Nitrite, UA: NEGATIVE
Protein, UA: NEGATIVE
Spec Grav, UA: 1.01 (ref 1.010–1.025)
Urobilinogen, UA: 0.2 E.U./dL
pH, UA: 8 (ref 5.0–8.0)

## 2023-04-22 MED ORDER — NITROFURANTOIN MONOHYD MACRO 100 MG PO CAPS
100.0000 mg | ORAL_CAPSULE | Freq: Two times a day (BID) | ORAL | 0 refills | Status: DC
Start: 2023-04-22 — End: 2023-05-05

## 2023-04-22 NOTE — Progress Notes (Signed)
    NURSE VISIT NOTE  Subjective:    Patient ID: Alexandra Joseph, female    DOB: 09/23/1996, 27 y.o.   MRN: [id number]  HPI  Patient is a 27 y.o. G63P1001 female who presents for vaginal irritation 1 day. Denies abnormal vaginal bleeding or significant pelvic pain or fever. admits to burning with urination, abdominal pain and genital irritation. Patient denies history of known exposure to STD.   Objective:    BP 105/68   Pulse 86   Ht 5' (1.524 m)   Wt 153 lb 9.6 oz (69.7 kg)   BMI 30.00 kg/m     Assessment:   1. Acute vaginitis   2. Burning with urination   3. Vaginal irritation   4. Leukocytes in urine       Plan:   GC/CT DNA  probe and urine culture sent to lab. Treatment: Macrobid bid for UTI, await culture results for further treatment.  ROV prn if symptoms persist or worsen.   Loman Chroman, CMA

## 2023-04-24 LAB — URINE CULTURE

## 2023-04-25 ENCOUNTER — Encounter: Payer: Self-pay | Admitting: Obstetrics and Gynecology

## 2023-04-26 LAB — CERVICOVAGINAL ANCILLARY ONLY
Bacterial Vaginitis (gardnerella): NEGATIVE
Candida Glabrata: NEGATIVE
Candida Vaginitis: POSITIVE — AB
Chlamydia: NEGATIVE
Comment: NEGATIVE
Comment: NEGATIVE
Comment: NEGATIVE
Comment: NEGATIVE
Comment: NEGATIVE
Comment: NORMAL
Neisseria Gonorrhea: NEGATIVE
Trichomonas: NEGATIVE

## 2023-04-26 NOTE — Telephone Encounter (Signed)
Called patient to inform her to stop antibiotics and that we need to wait on swab to come back to determine what is going on. She did make an appointment. I advised her to keep appointment just in case the results came back negative on swab. She verbalized understanding.

## 2023-04-26 NOTE — Telephone Encounter (Signed)
She does not have a UTI and those particular antibiotics won't treat if she has anything positive on her vaginal swab. She can stop the antibiotics as they won't help. We have to wait for her vaginal swab to return.

## 2023-04-26 NOTE — Telephone Encounter (Signed)
Patient was seen last week as a nurse visit. She had a urine culture and a aptima swab done. Urine culture resulted as negative. Vaginal swab is still processing. She complains of cramping, burning and clear discharge. I explained to her that she did not have a UTI and that her culture has not come back yet. I also advised patient to come in for an appointment to be seen. Please advise.

## 2023-04-27 ENCOUNTER — Encounter: Payer: Self-pay | Admitting: Obstetrics and Gynecology

## 2023-04-27 ENCOUNTER — Other Ambulatory Visit: Payer: Self-pay | Admitting: Obstetrics and Gynecology

## 2023-04-27 MED ORDER — TERCONAZOLE 0.4 % VA CREA: 1.0000 | TOPICAL_CREAM | Freq: Every day | VAGINAL | 0 refills | Status: AC

## 2023-05-03 ENCOUNTER — Ambulatory Visit: Payer: BC Managed Care – PPO | Admitting: Certified Nurse Midwife

## 2023-05-05 ENCOUNTER — Encounter: Payer: Self-pay | Admitting: Certified Nurse Midwife

## 2023-05-05 ENCOUNTER — Ambulatory Visit: Payer: BC Managed Care – PPO | Admitting: Certified Nurse Midwife

## 2023-05-05 VITALS — BP 111/77 | HR 74 | Ht 60.0 in | Wt 153.0 lb

## 2023-05-05 DIAGNOSIS — R109 Unspecified abdominal pain: Secondary | ICD-10-CM | POA: Diagnosis not present

## 2023-05-05 DIAGNOSIS — Z30431 Encounter for routine checking of intrauterine contraceptive device: Secondary | ICD-10-CM

## 2023-05-05 DIAGNOSIS — Z975 Presence of (intrauterine) contraceptive device: Secondary | ICD-10-CM

## 2023-05-05 NOTE — Progress Notes (Signed)
GYN ENCOUNTER NOTE  Subjective:       Alexandra Joseph is a 27 y.o. G23P1001 female is here for gynecologic evaluation of the following issues:  1. Pt having labial swelling and burning that comes and goes. She states it is not swollen now. Showed me a picture .  More swollen and irritated on left side. This started 2 weeks ago. She  did swab and urine culture last week that was negative. 2.  Abdominal pain , cramping that worsens with and after intercourse.   Gynecologic History No LMP recorded. (Menstrual status: IUD). Contraception: IUD Last Pap: 09/13/2019. Results were: normal Last mammogram: n/a.   Obstetric History OB History  Gravida Para Term Preterm AB Living  2 1 1     1   SAB IAB Ectopic Multiple Live Births          1    # Outcome Date GA Lbr Len/2nd Weight Sex Delivery Anes PTL Lv  2 Gravida           1 Term 07/23/17 [redacted]w[redacted]d  7 lb 10 oz (3.459 kg) M  EPI N LIV     Complications: Other Excessive Bleeding    Obstetric Comments  Retained placenta and D&C 1 day PP, anemia resulted with blood transfused.    Past Medical History:  Diagnosis Date   Anemia    Anxiety    Costochondritis    Depression    Menorrhagia    Migraine    Tachycardia     Past Surgical History:  Procedure Laterality Date   ADENOIDECTOMY     DILATION AND EVACUATION N/A 07/23/2017   Procedure: DILATATION AND EVACUATION;  Surgeon: Linzie Collin, MD;  Location: [facility] ORS;  Service: Gynecology;  Laterality: N/A;   TONSILLECTOMY      Current Outpatient Medications on File Prior to Visit  Medication Sig Dispense Refill   ibuprofen (ADVIL) 600 MG tablet Take 1 tablet (600 mg total) by mouth every 6 (six) hours as needed for cramping. 60 tablet 1   levonorgestrel (MIRENA) 20 MCG/DAY IUD 1 each by Intrauterine route once.     ondansetron (ZOFRAN) 4 MG tablet Take 1 tablet (4 mg total) by mouth every 8 (eight) hours as needed for nausea or vomiting. (Patient not taking: Reported on 05/05/2023) 20  tablet 0   sertraline (ZOLOFT) 50 MG tablet Take 50 mg by mouth daily. (Patient not taking: Reported on 05/05/2023)     No current facility-administered medications on file prior to visit.    No Known Allergies  Social History   Socioeconomic History   Marital status: Married    Spouse name: Apolinar Junes   Number of children: 1   Years of education: Not on file   Highest education level: Not on file  Occupational History   Not on file  Tobacco Use   Smoking status: Never   Smokeless tobacco: Never  Vaping Use   Vaping Use: Never used  Substance and Sexual Activity   Alcohol use: No   Drug use: No   Sexual activity: Not Currently  Other Topics Concern   Not on file  Social History Narrative   Lives in [place]; work at hospital at [employer]; no smoking; no alcohol.    Social Determinants of Health   Financial Resource Strain: Not on file  Food Insecurity: Not on file  Transportation Needs: Not on file  Physical Activity: Not on file  Stress: Not on file  Social Connections: Not on file  Intimate  Partner Violence: Not on file    Family History  Problem Relation Age of Onset   Hypertension Mother    Arrhythmia Mother        palpitations   Hyperlipidemia Mother    Heart attack Maternal Uncle 85   Heart attack Maternal Grandmother    Heart disease Maternal Grandmother        CABG   Stroke Maternal Grandmother    Osteoarthritis Maternal Grandmother    Hypertension Maternal Grandfather    Stroke Maternal Grandfather     The following portions of the patient's history were reviewed and updated as appropriate: allergies, current medications, past family history, past medical history, past social history, past surgical history and problem list.  Review of Systems Review of Systems - Negative except as mentioned in HPI Review of Systems - General ROS: negative for - chills, fatigue, fever, hot flashes, malaise or night sweats Hematological and Lymphatic ROS: negative  for - bleeding problems or swollen lymph nodes Gastrointestinal ROS: negative for - abdominal pain, blood in stools, change in bowel habits and nausea/vomiting Musculoskeletal ROS: negative for - joint pain, muscle pain or muscular weakness Genito-Urinary ROS: negative for - change in menstrual cycle, dysmenorrhea, dyspareunia, dysuria, genital discharge, genital ulcers, hematuria, incontinence, irregular/heavy menses, nocturia. Positive for abdominal/pelvic pain with & after intercourse, Labial swelling and irritation.   Objective:   BP 111/77   Pulse 74   Ht 5' (1.524 m)   Wt 153 lb (69.4 kg)   BMI 29.88 kg/m  CONSTITUTIONAL: Well-developed, well-nourished female in no acute distress.  HENT:  Normocephalic, atraumatic.  NECK: Normal range of motion, supple, no masses.  Normal thyroid.  SKIN: Skin is warm and dry. No rash noted. Not diaphoretic. No erythema. No pallor. NEUROLGIC: Alert and oriented to person, place, and time. PSYCHIATRIC: Normal mood and affect. Normal behavior. Normal judgment and thought content. CARDIOVASCULAR:Not Examined RESPIRATORY: Not Examined BREASTS: Not Examined ABDOMEN: Soft, non distended; Non tender.  No Organomegaly. PELVIC:  External Genitalia: Normal  BUS: Normal  Vagina: Normal, no swelling   Cervix: Normal, no strings seen on exam today   MUSCULOSKELETAL: Normal range of motion. No tenderness.  No cyanosis, clubbing, or edema.     Assessment:   1. IUD check up - US PELVIC COMPLETE WITH TRANSVAGINAL; Future     Plan:   Discussed possible contact dermatitis. Discussed changing detergent/soaps. Discussed use of lubrication , and sex toys. Pt states she has not used toys in over 6 months. Encouraged coconut oil /tea tree oil to affected area , discussed sleeping with out underwear and avoidance of satin/silk underwear. Recommend benadryl and ice packs if/when it occurs again. Dr. Valentino Saxon consulted. She agrees with plan.  U/s order placed to  evaluate for IUD.   [name], CNM

## 2023-05-11 ENCOUNTER — Ambulatory Visit
Admission: RE | Admit: 2023-05-11 | Discharge: 2023-05-11 | Disposition: A | Discharge status: Home / Self Care | Payer: BC Managed Care – PPO | Source: Ambulatory Visit | Attending: Certified Nurse Midwife | Admitting: Certified Nurse Midwife

## 2023-05-11 ENCOUNTER — Telehealth: Payer: Self-pay

## 2023-05-11 DIAGNOSIS — Z30431 Encounter for routine checking of intrauterine contraceptive device: Secondary | ICD-10-CM | POA: Diagnosis present

## 2023-05-11 NOTE — Telephone Encounter (Signed)
Pt calling to see when someone will read the u/s she had this morning and let her know the results?  [phone]

## 2023-05-12 ENCOUNTER — Encounter: Payer: Self-pay | Admitting: Certified Nurse Midwife

## 2023-05-17 ENCOUNTER — Ambulatory Visit (INDEPENDENT_AMBULATORY_CARE_PROVIDER_SITE_OTHER): Payer: BC Managed Care – PPO | Admitting: Certified Nurse Midwife

## 2023-05-17 ENCOUNTER — Encounter: Payer: Self-pay | Admitting: Certified Nurse Midwife

## 2023-05-17 VITALS — BP 118/80 | HR 76 | Wt 153.7 lb

## 2023-05-17 DIAGNOSIS — Z30432 Encounter for removal of intrauterine contraceptive device: Secondary | ICD-10-CM

## 2023-05-17 MED ORDER — NORETHINDRONE 0.35 MG PO TABS: 1.0000 | ORAL_TABLET | Freq: Every day | ORAL | 3 refills | Status: DC

## 2023-05-17 NOTE — Progress Notes (Signed)
  GYNECOLOGY OFFICE PROCEDURE NOTE  Alexandra Joseph is a 27 y.o. G2P1001 here for mirena IUD removal due to migration into the lower uterine segment.  No GYN concerns.   IUD Removal  Patient identified, informed consent performed, consent signed.  Patient was placed in the dorsal lithotomy position, normal external genitalia was noted.  A speculum was placed in the patient's vagina, normal discharge was noted, no lesions. The cervix was visualized, no lesions, no abnormal discharge.  The strings of the IUD were grasped and pulled using ring forceps. The IUD was removed in its entirety. .  Patient tolerated the procedure well.    Patient will use POP for contraception. Pt counseled to use back up 10-14 days.  Routine preventative health maintenance measures emphasized.   [name], CNM

## 2023-05-17 NOTE — Patient Instructions (Signed)

## 2023-05-25 ENCOUNTER — Ambulatory Visit: Payer: BC Managed Care – PPO | Admitting: Certified Nurse Midwife

## 2023-05-25 ENCOUNTER — Other Ambulatory Visit (HOSPITAL_COMMUNITY)
Admission: RE | Admit: 2023-05-25 | Discharge: 2023-05-25 | Disposition: A | Discharge status: Home / Self Care | Payer: BC Managed Care – PPO | Source: Ambulatory Visit | Attending: Certified Nurse Midwife | Admitting: Certified Nurse Midwife

## 2023-05-25 ENCOUNTER — Encounter: Payer: Self-pay | Admitting: Certified Nurse Midwife

## 2023-05-25 VITALS — BP 107/68 | HR 59 | Ht 60.0 in | Wt 155.6 lb

## 2023-05-25 DIAGNOSIS — Z01419 Encounter for gynecological examination (general) (routine) without abnormal findings: Secondary | ICD-10-CM

## 2023-05-25 DIAGNOSIS — Z124 Encounter for screening for malignant neoplasm of cervix: Secondary | ICD-10-CM | POA: Insufficient documentation

## 2023-05-25 NOTE — Patient Instructions (Signed)

## 2023-05-25 NOTE — Progress Notes (Signed)
GYNECOLOGY ANNUAL PREVENTATIVE CARE ENCOUNTER NOTE  History:     LATASHA GRIFF is a 27 y.o. G31P1001 female here for a routine annual gynecologic exam.  Current complaints: none.   Denies abnormal vaginal bleeding, discharge, pelvic pain, problems with intercourse or other gynecologic concerns.     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 Contraception: oral progesterone-only contraceptive Last Pap: 09/13/2019. Results were: normal Last mammogram: n/a .    Upstream - 05/25/23 0835       Pregnancy Intention Screening   Does the patient want to become pregnant in the next year? No    Does the patient's partner want to become pregnant in the next year? No    Would the patient like to discuss contraceptive options today? No      Contraception Wrap Up   Current Method Oral Contraceptive    End Method Oral Contraceptive    Contraception Counseling Provided No            The pregnancy intention screening data noted above was reviewed. Potential methods of contraception were discussed. The patient elected to proceed with Oral Contraceptive.   Obstetric History OB History  Gravida Para Term Preterm AB Living  2 1 1     1   SAB IAB Ectopic Multiple Live Births          1    # Outcome Date GA Lbr Len/2nd Weight Sex Delivery Anes PTL Lv  2 Gravida           1 Term 07/23/17 [redacted]w[redacted]d  7 lb 10 oz (3.459 kg) M  EPI N LIV     Complications: Other Excessive Bleeding    Obstetric Comments  Retained placenta and D&C 1 day PP, anemia resulted with blood transfused.    Past Medical History:  Diagnosis Date   Anemia    Anxiety    Costochondritis    Depression    Menorrhagia    Migraine    Tachycardia     Past Surgical History:  Procedure  Laterality Date   ADENOIDECTOMY     DILATION AND EVACUATION N/A 07/23/2017   Procedure: DILATATION AND EVACUATION;  Surgeon: Linzie Collin, MD;  Location: [facility] ORS;  Service: Gynecology;  Laterality: N/A;   TONSILLECTOMY      Current Outpatient Medications on File Prior to Visit  Medication Sig Dispense Refill   ibuprofen (ADVIL) 600 MG tablet Take 1 tablet (600 mg total) by mouth every 6 (six) hours as needed for cramping. 60 tablet 1   norethindrone (MICRONOR) 0.35 MG tablet Take 1 tablet (0.35 mg total) by mouth daily. 90 tablet 3   ondansetron (ZOFRAN) 4 MG tablet Take 1 tablet (4 mg total) by mouth every 8 (eight) hours as needed for nausea or vomiting. (Patient not taking: Reported on 05/05/2023) 20 tablet 0   sertraline (ZOLOFT) 50 MG tablet Take 50 mg by mouth daily. (Patient not taking: Reported on 05/05/2023)     No current facility-administered medications on file prior to visit.    No Known Allergies  Social History:  reports that she has never smoked. She has never used smokeless tobacco. She reports that she does not drink alcohol and does not use drugs.  Family History  Problem Relation Age of Onset  Hypertension Mother    Arrhythmia Mother        palpitations   Hyperlipidemia Mother    Heart attack Maternal Uncle 60   Heart attack Maternal Grandmother    Heart disease Maternal Grandmother        CABG   Stroke Maternal Grandmother    Osteoarthritis Maternal Grandmother    Hypertension Maternal Grandfather    Stroke Maternal Grandfather     The following portions of the patient's history were reviewed and updated as appropriate: allergies, current medications, past family history, past medical history, past social history, past surgical history and problem list.  Review of Systems Pertinent items noted in HPI and remainder of comprehensive ROS otherwise negative.  Physical Exam:  BP 107/68   Pulse (!) 59   Ht 5' (1.524 m) Comment: patient reports  Wt 155  lb 9.6 oz (70.6 kg)   LMP 05/17/2023   BMI 30.39 kg/m  CONSTITUTIONAL: Well-developed, well-nourished female in no acute distress.  HENT:  Normocephalic, atraumatic, External right and left ear normal. Oropharynx is clear and moist EYES: Conjunctivae and EOM are normal. Pupils are equal, round, and reactive to light. No scleral icterus.  NECK: Normal range of motion, supple, no masses.  Normal thyroid.  SKIN: Skin is warm and dry. No rash noted. Not diaphoretic. No erythema. No pallor. MUSCULOSKELETAL: Normal range of motion. No tenderness.  No cyanosis, clubbing, or edema.  2+ distal pulses. NEUROLOGIC: Alert and oriented to person, place, and time. Normal reflexes, muscle tone coordination.  PSYCHIATRIC: Normal mood and affect. Normal behavior. Normal judgment and thought content. CARDIOVASCULAR: Normal heart rate noted, regular rhythm RESPIRATORY: Clear to auscultation bilaterally. Effort and breath sounds normal, no problems with respiration noted. BREASTS: Symmetric in size. No masses, tenderness, skin changes, nipple drainage, or lymphadenopathy bilaterally.  ABDOMEN: Soft, no distention noted.  No tenderness, rebound or guarding.  PELVIC: Normal appearing external genitalia and urethral meatus; normal appearing vaginal mucosa and cervix.  No abnormal discharge noted.  Pap smear obtained.  Normal uterine size, no other palpable masses, no uterine or adnexal tenderness.  .   Assessment and Plan:    1. Women's annual routine gynecological examination   Pap: Will follow up results of pap smear and manage accordingly. Mammogram : n/a  Labs: none  Refills: none Referral: none Routine preventative health maintenance measures emphasized. Please refer to After Visit Summary for other counseling recommendations.      [name], CNM [facility]  [facility],  [facility]

## 2023-06-02 ENCOUNTER — Encounter: Payer: Self-pay | Admitting: Certified Nurse Midwife

## 2023-06-02 LAB — CYTOLOGY - PAP
Comment: NEGATIVE
Diagnosis: UNDETERMINED — AB
High risk HPV: NEGATIVE

## 2023-06-04 ENCOUNTER — Other Ambulatory Visit: Payer: BC Managed Care – PPO

## 2023-09-15 ENCOUNTER — Ambulatory Visit (INDEPENDENT_AMBULATORY_CARE_PROVIDER_SITE_OTHER): Payer: BC Managed Care – PPO | Admitting: Certified Nurse Midwife

## 2023-09-15 ENCOUNTER — Encounter: Payer: Self-pay | Admitting: Certified Nurse Midwife

## 2023-09-15 VITALS — BP 115/73 | HR 76 | Ht 60.0 in | Wt 163.3 lb

## 2023-09-15 DIAGNOSIS — Z3009 Encounter for other general counseling and advice on contraception: Secondary | ICD-10-CM

## 2023-09-15 MED ORDER — NORETHINDRONE ACET-ETHINYL EST 1.5-30 MG-MCG PO TABS: 1.0000 | ORAL_TABLET | Freq: Every day | ORAL | 11 refills | Status: DC

## 2023-09-15 NOTE — Patient Instructions (Signed)
 Oral Contraception Use  Oral contraceptive pills (OCPs) are medicines that prevent pregnancy. OCPs work by:  Preventing the ovaries from releasing eggs.  Thickening mucus in the lower part of the uterus (cervix). This prevents sperm from entering the uterus.  Thinning the lining of the uterus (endometrium). This prevents a fertilized egg from attaching to the endometrium.  Discuss possible side effects of OCPs with your health care provider. It can take 2-3 months for your body to adjust to changes in hormone levels.  What are the risks?  OCPs can sometimes cause side effects, such as:  Headache.  Depression.  Trouble sleeping.  Nausea and vomiting.  Breast tenderness.  Irregular bleeding or spotting during the first several months.  Bloating or fluid retention.  Increase in blood pressure.  OCPs with estrogen and progestins may slightly increase the risk of:  Blood clots.  Heart attack.  Stroke.  How to take OCPs    Follow instructions from your health care provider about how to take your first cycle of OCPs. There are 2 types of OCPs. The first, combination OCPs, have both estrogen and progestins. The second, progestin-only pills, have only progestin.  For combination OCPs, you may start the pill:  On day 1 of your menstrual period.  On the first Sunday after your period starts, or on the day you get your prescription.  At any time of your cycle.  If you start taking the pill within 5 days after the start of your period, you will not need a backup form of birth control, such as condoms.  If you start at any other time of your menstrual cycle, you will need to use a backup form of birth control.  For progestin-only OCPs:  Ideally, you can start taking the pill on the first day of your menstrual period, but you can start it on any other day too.  These pills will protect you from pregnancy after taking it for 2 days (48 hours). You can stop using a backup form of birth control after that time. It is important that  you take this pill at the same time every day. Even taking it 3 hours late can increase the risk of pregnancy.  No matter which day you start the OCP, you will always start a new pack on that same day of the week. Have an extra pack of OCPs and a backup contraceptive method available in case you miss some pills or lose your OCP pack.  Missed doses  Follow instructions from your health care provider for missed doses. Information about missed doses can also be found in the patient information sheet that comes with your pack of pills.  In general, for combined OCPs:  If you forget to take the pill for 1 day, take it as soon as you can. This may mean taking 2 pills on the same day and at the same time. Take the next day's pill at the regular time.  If you forget to take the pill for 2 days in a row, take 2 tablets on the day you remember and 2 tablets on the following day. A backup form of birth control should be used for 7 days after you are back on schedule.  If you forget to take the pill for 3 days in a row, call your health care provider for directions on when to restart taking your pills. Do not take the missed pills. A backup form of birth control will be needed for 7 days  once you restart your pills.  If you use a pack that contains inactive pills and you miss 1 or more of the inactive pills, you do not need to take the missed doses. Skip them and start the new pack on the regular day.  For progestin-only OCPs:  If your dose is 3 hours or more late, or if you miss 1 or more doses, take 1 missed pill as soon as you can.  If you miss one or more doses, you must use a backup form of birth control. Some brands of progestin-only pills recommend using a backup form of birth control for 48 hours after a missed or late dose while others recommend 7 days. If you are not sure what to do, call your health care provider or check the patient information sheet that came with your pills.  Follow these instructions at home:  Do  not use any products that contain nicotine or tobacco. These include cigarettes, chewing tobacco, or vaping devices, such as e-cigarettes. If you need help quitting, ask your health care provider.  Always use a condom to protect against STIs (sexually transmitted infections). Oral contraception pills do not protect against STIs.  Use a calendar to mark the days of your menstrual period.  Read the information sheet and directions that came with your OCP. Talk to your health care provider if you have questions.  Contact a health care provider if:  You develop nausea and vomiting.  You have abnormal vaginal discharge or bleeding.  You develop a rash.  You miss your menstrual period. Depending on the type of OCP you are taking, this may be a sign of pregnancy.  You are losing your hair.  You need treatment for mood swings or depression.  You get dizzy when taking the OCP.  You develop acne after taking the OCP.  You become pregnant or think you may be pregnant.  You have diarrhea, constipation, and abdominal pain or cramps.  You are not sure what to do after missing pills.  Get help right away if:  You develop chest pain.  You develop shortness of breath.  You have an uncontrolled or severe headache.  You develop numbness or slurred speech.  You develop vision or speech problems.  You develop pain, redness, and swelling in your legs.  You develop weakness or numbness in your arms or legs.  These symptoms may represent a serious problem that is an emergency. Do not wait to see if the symptoms will go away. Get medical help right away. Call your local emergency services (911 in the [place] Do not drive yourself to the hospital.  Summary  Oral contraceptive pills (OCPs) are medicines that you take to prevent pregnancy.  OCPs do not prevent sexually transmitted infections (STIs). Always use a condom to protect against STIs.  When you start an OCP, be aware that it can take 2-3 months for your body to adjust to changes in  hormone levels.  Read all the information and directions that come with your OCP.  This information is not intended to replace advice given to you by your health care provider. Make sure you discuss any questions you have with your health care provider.  Document Revised: 07/09/2020 Document Reviewed: 07/11/2020  Elsevier Patient Education  2024 [employer].

## 2023-09-15 NOTE — Progress Notes (Signed)
Subjective:    Alexandra Joseph is a 27 y.o. female who presents for contraception counseling. She has been taking POP but has had issues with irregular bleeding. States she has spotting for a few days then will have bleeding. She states the last 2 weeks she has had heavy bleeding , saturating a super plus tampon. She is interested in trying a different method.. The patient is sexually active. Pertinent past medical history: none.  Menstrual History: OB History     Gravida  2   Para  1   Term  1   Preterm      AB      Living  1      SAB      IAB      Ectopic      Multiple      Live Births  1        Obstetric Comments  Retained placenta and D&C 1 day PP, anemia resulted with blood transfused.         Patient's last menstrual period was 09/07/2023 (exact date).    The following portions of the patient's history were reviewed and updated as appropriate: allergies, current medications, past family history, past medical history, past social history, past surgical history, and problem list.  Review of Systems Pertinent items are noted in HPI.   Objective:    No exam performed today,  not indicated for birth contorl  .   Assessment:    27 y.o., discontinuing oral progesterone-only contraceptive, Switching to OCP no contraindications.   Plan:    All questions answered. Previously we had avoided the combined methods due to her concern for mood changes. She state she is in a better place now and that she would like to try OCP. She denies any contraindication to use. Will order . Recommend back up protection for 10 -14 days.   [name], CNM

## 2023-11-02 ENCOUNTER — Encounter: Payer: Self-pay | Admitting: Certified Nurse Midwife

## 2023-11-05 ENCOUNTER — Ambulatory Visit (INDEPENDENT_AMBULATORY_CARE_PROVIDER_SITE_OTHER): Payer: BC Managed Care – PPO

## 2023-11-05 ENCOUNTER — Other Ambulatory Visit (HOSPITAL_COMMUNITY)
Admission: RE | Admit: 2023-11-05 | Discharge: 2023-11-05 | Disposition: A | Discharge status: Home / Self Care | Payer: BC Managed Care – PPO | Source: Ambulatory Visit | Attending: Obstetrics and Gynecology | Admitting: Obstetrics and Gynecology

## 2023-11-05 VITALS — BP 128/66 | HR 82 | Ht 60.0 in | Wt 170.3 lb

## 2023-11-05 DIAGNOSIS — N898 Other specified noninflammatory disorders of vagina: Secondary | ICD-10-CM | POA: Diagnosis present

## 2023-11-05 NOTE — Patient Instructions (Signed)
Vaginitis  Vaginitis is irritation and swelling of the vagina. Treatment will depend on the cause. What are the causes? It can be caused by: Bacteria. Yeast. A parasite. A virus. Low hormone levels. Bubble baths, scented tampons, and feminine sprays. Other things can change the balance of the yeast and bacteria that live in the vagina. These include: Antibiotic medicines. Not being clean enough. Some birth control methods. Sex. Infection. Diabetes. A weakened body defense system (immune system). What increases the risk? Smoking or being around someone who smokes. Using washes (douches), scented tampons, or scented pads. Wearing tight pants or thong underwear. Using birth control pills or an IUD. Having sex without a condom or having a lot of partners. Having an STI. Using a certain product to kill sperm (nonoxynol-9). Eating foods that are high in sugar. Having diabetes. Having low levels of a female hormone. Having a weakened body defense system. Being pregnant or breastfeeding. What are the signs or symptoms? Fluid coming from the vagina that is not normal. A bad smell. Itching, pain, or swelling. Pain with sex. Pain or burning when you pee (urinate). Sometimes there are no symptoms. How is this treated? Treatment may include: Antibiotic creams or pills. Antifungal medicines. Medicines to ease symptoms if you have a virus. Your sex partner should also be treated. Estrogen medicines. Avoiding scented soaps, sprays, or douches. Stopping use of products that caused irritation and then using a cream to treat symptoms. Follow these instructions at home: Lifestyle Keep the area around your vagina clean and dry. Avoid using soap. Rinse the area with water. Until your doctor says it is okay: Do not use washes for the vagina. Do not use tampons. Do not have sex. Wipe from front to back after going to the bathroom. When your doctor says it is okay, practice safe sex  and use condoms. General instructions Take over-the-counter and prescription medicines only as told by your doctor. If you were prescribed an antibiotic medicine, take or use it as told by your doctor. Do not stop taking or using it even if you start to feel better. Keep all follow-up visits. How is this prevented? Do not use things that can irritate the vagina, such as fabric softeners. Avoid these products if they are scented: Sprays. Detergents. Tampons. Products for cleaning the vagina. Soaps or bubble baths. Let air reach your vagina. To do this: Wear cotton underwear. Do not wear: Underwear while you sleep. Tight pants. Thong underwear. Underwear or nylons without a cotton panel. Take off any wet clothing, such as bathing suits, as soon as you can. Practice safe sex and use condoms. Contact a doctor if: You have pain in your belly or in the area between your hips. You have a fever or chills. Your symptoms last for more than 2-3 days. Get help right away if: You have a fever and your symptoms get worse all of a sudden. Summary Vaginitis is irritation and swelling of the vagina. Treatment will depend on the cause of the condition. Do not use washes or tampons or have sex until your doctor says it is okay. This information is not intended to replace advice given to you by your health care provider. Make sure you discuss any questions you have with your health care provider. Document Revised: 05/02/2020 Document Reviewed: 05/02/2020 Elsevier Patient Education  2024 [employer].

## 2023-11-05 NOTE — Progress Notes (Signed)
    NURSE VISIT NOTE  Subjective:    Patient ID: TALESIA GRIFFETH, female    DOB: 07/09/1996, 27 y.o.   MRN: [id number]  HPI  Patient is a 27 y.o. G45P1001 female who presents for watery vaginal discharge for >1 month(s). Denies abnormal vaginal bleeding or significant pelvic pain or fever. denies dysuria, hematuria, urinary frequency, urinary urgency, flank pain, abdominal pain, pelvic pain, and cloudy malordorous urine. Patient denies history of known exposure to STD.   Objective:    BP 128/66   Pulse 82   Ht 5' (1.524 m)   Wt 170 lb 4.8 oz (77.2 kg)   LMP 11/03/2023 (Exact Date)   BMI 33.26 kg/m      Assessment:   1. Vaginal discharge     rule out GC or chlamydia and nonspecific vaginitis  Plan:   GC and chlamydia DNA  probe sent to lab. Treatment:will await results of cervicovaginal swab. abstain from coitus during course of treatment ROV prn if symptoms persist or worsen.   Fonda Kinder, CMA

## 2023-11-08 ENCOUNTER — Encounter: Payer: Self-pay | Admitting: Certified Nurse Midwife

## 2023-11-09 LAB — CERVICOVAGINAL ANCILLARY ONLY
Bacterial Vaginitis (gardnerella): POSITIVE — AB
Candida Glabrata: NEGATIVE
Candida Vaginitis: POSITIVE — AB
Chlamydia: NEGATIVE
Comment: NEGATIVE
Comment: NEGATIVE
Comment: NEGATIVE
Comment: NEGATIVE
Comment: NEGATIVE
Comment: NORMAL
Neisseria Gonorrhea: NEGATIVE
Trichomonas: NEGATIVE

## 2023-11-11 MED ORDER — METRONIDAZOLE 500 MG PO TABS: 500.0000 mg | ORAL_TABLET | Freq: Two times a day (BID) | ORAL | 0 refills | Status: DC

## 2023-11-11 MED ORDER — FLUCONAZOLE 150 MG PO TABS
150.0000 mg | ORAL_TABLET | Freq: Once | ORAL | 0 refills | Status: AC
Start: 2023-11-11 — End: 2023-11-11

## 2023-11-15 ENCOUNTER — Other Ambulatory Visit: Payer: Self-pay

## 2023-11-15 DIAGNOSIS — B3731 Acute candidiasis of vulva and vagina: Secondary | ICD-10-CM

## 2023-11-15 MED ORDER — FLUCONAZOLE 150 MG PO TABS: 150.0000 mg | ORAL_TABLET | Freq: Every day | ORAL | 0 refills | Status: DC

## 2024-02-24 ENCOUNTER — Ambulatory Visit: Admitting: Certified Nurse Midwife

## 2024-04-01 ENCOUNTER — Other Ambulatory Visit: Payer: Self-pay | Admitting: Certified Nurse Midwife

## 2024-04-01 MED ORDER — NITROFURANTOIN MONOHYD MACRO 100 MG PO CAPS: 100.0000 mg | ORAL_CAPSULE | Freq: Two times a day (BID) | ORAL | 0 refills | Status: AC

## 2024-04-01 NOTE — Progress Notes (Signed)
 Pt called nurse line with c/o urinary frequency and pain. She has history of UTI. Orders placed for Macrobid . Pt instructed to come to the office for urine culture on Monday to confirm UTI/  correct antibiotic treatment.   Alise Appl, CNM

## 2024-04-03 ENCOUNTER — Telehealth: Payer: Self-pay

## 2024-04-03 NOTE — Telephone Encounter (Signed)
Spoke with pt, nurse visit scheduled for tomorrow

## 2024-04-04 ENCOUNTER — Ambulatory Visit (INDEPENDENT_AMBULATORY_CARE_PROVIDER_SITE_OTHER)

## 2024-04-04 VITALS — BP 109/74 | HR 79 | Ht 60.0 in | Wt 171.5 lb

## 2024-04-04 DIAGNOSIS — R399 Unspecified symptoms and signs involving the genitourinary system: Secondary | ICD-10-CM | POA: Diagnosis not present

## 2024-04-04 LAB — POCT URINALYSIS DIPSTICK
Bilirubin, UA: NEGATIVE
Blood, UA: NEGATIVE
Glucose, UA: NEGATIVE
Ketones, UA: NEGATIVE
Nitrite, UA: NEGATIVE
Protein, UA: NEGATIVE
Spec Grav, UA: 1.015 (ref 1.010–1.025)
Urobilinogen, UA: 0.2 U/dL
pH, UA: 7 (ref 5.0–8.0)

## 2024-04-04 NOTE — Progress Notes (Signed)
    NURSE VISIT NOTE  Subjective:    Patient ID: Alexandra Joseph, female    DOB: 1996/05/13, 28 y.o.   MRN: [id number]       HPI  Patient is a 28 y.o. G31P1001 female who presents for pressure for 3 days.  Patient does have a history of recurrent UTI.  Patient does not have a history of pyelonephritis.    Objective:    BP 109/74   Pulse 79   Ht 5' (1.524 m)   Wt 171 lb 8 oz (77.8 kg)   BMI 33.49 kg/m    Lab Review  Results for orders placed or performed in visit on 04/04/24  POCT Urinalysis Dipstick  Result Value Ref Range   Color, UA yellow    Clarity, UA clear    Glucose, UA Negative Negative   Bilirubin, UA neg    Ketones, UA neg    Spec Grav, UA 1.015 1.010 - 1.025   Blood, UA neg    pH, UA 7.0 5.0 - 8.0   Protein, UA Negative Negative   Urobilinogen, UA 0.2 0.2 or 1.0 E.U./dL   Nitrite, UA neg    Leukocytes, UA Trace (A) Negative   Appearance     Odor      Assessment:   1. UTI symptoms      Plan:   Urine Culture Sent. Maintain adequate hydration.  May use AZO OTC prn.  Follow up if symptoms worsen or fail to improve as anticipated, and as needed.    Vale Garrison, CMA

## 2024-04-06 ENCOUNTER — Encounter: Payer: Self-pay | Admitting: Certified Nurse Midwife

## 2024-04-06 LAB — URINE CULTURE

## 2024-04-27 ENCOUNTER — Other Ambulatory Visit: Payer: Self-pay

## 2024-04-27 ENCOUNTER — Other Ambulatory Visit: Payer: Self-pay | Admitting: Certified Nurse Midwife

## 2024-04-27 MED ORDER — NORETHINDRONE ACET-ETHINYL EST 1.5-30 MG-MCG PO TABS: 1.0000 | ORAL_TABLET | Freq: Every day | ORAL | 1 refills | Status: DC

## 2024-06-09 ENCOUNTER — Encounter: Payer: Self-pay | Admitting: Certified Nurse Midwife

## 2024-06-09 ENCOUNTER — Ambulatory Visit: Admitting: Certified Nurse Midwife

## 2024-06-09 VITALS — BP 114/79 | HR 76 | Ht 60.0 in | Wt 175.5 lb

## 2024-06-09 DIAGNOSIS — Z01419 Encounter for gynecological examination (general) (routine) without abnormal findings: Secondary | ICD-10-CM

## 2024-06-09 DIAGNOSIS — R197 Diarrhea, unspecified: Secondary | ICD-10-CM | POA: Insufficient documentation

## 2024-06-09 MED ORDER — NORETHINDRONE ACET-ETHINYL EST 1.5-30 MG-MCG PO TABS: 1.0000 | ORAL_TABLET | Freq: Every day | ORAL | 3 refills | Status: AC

## 2024-06-09 NOTE — Progress Notes (Signed)
 GYNECOLOGY ANNUAL PREVENTATIVE CARE ENCOUNTER NOTE  History:     Alexandra Joseph is a 28 y.o. G84P1001 female here for a routine annual gynecologic exam.  Current complaints: lump on inner right thigh,last 2 months period irregular, frequent UTIs . Pt state she has been having diarrhea daily since November. She has been to her PCP but they said it is fine. She is requesting referral to GI.   Denies abnormal vaginal bleeding, discharge, pelvic pain, problems with intercourse or other gynecologic concerns.     Social Relationship: Married Living: Husband and kids Work: [employer] Exercise: Sometimes Smoke/Alcohol/drug use: No  Gynecologic History No LMP recorded (lmp unknown). (Menstrual status: Oral contraceptives). Contraception: ocp Last Pap: 05/25/23. Results were: abnormal with negative HPV Last mammogram: na. Results were: na  Obstetric History OB History  Gravida Para Term Preterm AB Living  2 1 1   1   SAB IAB Ectopic Multiple Live Births      1    # Outcome Date GA Lbr Len/2nd Weight Sex Type Anes PTL Lv  2 Gravida           1 Term 07/23/17 [redacted]w[redacted]d  7 lb 10 oz (3.459 kg) M  EPI N LIV     Complications: Other Excessive Bleeding    Obstetric Comments  Retained placenta and D&C 1 day PP, anemia resulted with blood transfused.    Past Medical History:  Diagnosis Date   Anemia    Anxiety    Costochondritis    Depression    Menorrhagia    Migraine    Tachycardia     Past Surgical History:  Procedure Laterality Date   ADENOIDECTOMY     DILATION AND EVACUATION N/A 07/23/2017   Procedure: DILATATION AND EVACUATION;  Surgeon: Janit Alm Agent, MD;  Location: [facility] ORS;  Service: Gynecology;  Laterality: N/A;   TONSILLECTOMY      Current Outpatient Medications on File Prior to Visit  Medication Sig Dispense Refill   Norethindrone  Acetate-Ethinyl Estradiol  (JUNEL 1.5/30) 1.5-30 MG-MCG tablet Take 1 tablet by mouth daily. 28 tablet 1   fluconazole  (DIFLUCAN )  150 MG tablet Take 1 tablet (150 mg total) by mouth daily. (Patient not taking: Reported on 06/09/2024) 1 tablet 0   ibuprofen  (ADVIL ) 600 MG tablet Take 1 tablet (600 mg total) by mouth every 6 (six) hours as needed for cramping. (Patient not taking: Reported on 06/09/2024) 60 tablet 1   metroNIDAZOLE  (FLAGYL ) 500 MG tablet Take 1 tablet (500 mg total) by mouth 2 (two) times daily. (Patient not taking: Reported on 06/09/2024) 14 tablet 0   norethindrone  (MICRONOR ) 0.35 MG tablet Take 1 tablet (0.35 mg total) by mouth daily. (Patient not taking: Reported on 06/09/2024) 90 tablet 3   ondansetron  (ZOFRAN ) 4 MG tablet Take 1 tablet (4 mg total) by mouth every 8 (eight) hours as needed for nausea or vomiting. (Patient not taking: Reported on 06/09/2024) 20 tablet 0   sertraline  (ZOLOFT ) 50 MG tablet Take 50 mg by mouth daily. (Patient not taking: Reported on 06/09/2024)     No current facility-administered medications on file prior to visit.    No Known Allergies  Social History:  reports that she has never smoked. She has never used smokeless tobacco. She reports that she does not currently use alcohol. She reports that she does not use drugs.  Family History  Problem Relation Age of Onset   Hypertension Mother    Arrhythmia Mother  palpitations   Hyperlipidemia Mother    Breast cancer Maternal Aunt    Cervical cancer Maternal Aunt    Pancreatic cancer Maternal Aunt    Heart attack Maternal Uncle 42   Pancreatic cancer Maternal Uncle    Heart attack Maternal Grandmother    Heart disease Maternal Grandmother        CABG   Stroke Maternal Grandmother    Osteoarthritis Maternal Grandmother    Hypertension Maternal Grandfather    Stroke Maternal Grandfather     The following portions of the patient's history were reviewed and updated as appropriate: allergies, current medications, past family history, past medical history, past social history, past surgical history and problem  list.  Review of Systems Pertinent items noted in HPI and remainder of comprehensive ROS otherwise negative.  Physical Exam:  BP 114/79   Pulse 76   Ht 5' (1.524 m)   Wt 175 lb 8 oz (79.6 kg)   LMP  (LMP Unknown)   BMI 34.27 kg/m  CONSTITUTIONAL: Well-developed, well-nourished female in no acute distress.  HENT:  Normocephalic, atraumatic, External right and left ear normal. Oropharynx is clear and moist EYES: Conjunctivae and EOM are normal. Pupils are equal, round, and reactive to light. No scleral icterus.  NECK: Normal range of motion, supple, no masses.  Normal thyroid.  SKIN: Skin is warm and dry. No rash noted. Not diaphoretic. No erythema. No pallor. MUSCULOSKELETAL: Normal range of motion. No tenderness.  No cyanosis, clubbing, or edema.  2+ distal pulses. NEUROLOGIC: Alert and oriented to person, place, and time. Normal reflexes, muscle tone coordination.  PSYCHIATRIC: Normal mood and affect. Normal behavior. Normal judgment and thought content. CARDIOVASCULAR: Normal heart rate noted, regular rhythm RESPIRATORY: Clear to auscultation bilaterally. Effort and breath sounds normal, no problems with respiration noted. BREASTS: Symmetric in size. No masses, tenderness, skin changes, nipple drainage, or lymphadenopathy bilaterally.  ABDOMEN: Soft, no distention noted.  No tenderness, rebound or guarding.  PELVIC: Normal appearing external genitalia and urethral meatus; normal appearing vaginal mucosa and cervix.  No abnormal discharge noted.  Pap smear obtained.  Normal uterine size, no other palpable masses, no uterine or adnexal tenderness.  Right inner thigh firm pea sized lump noted. Likely scar from infected hair follicle . No active infection    Assessment and Plan:  Annual GYN Exam   Pap: not due  Mammogram : n/a  Labs: none Refills: ocp  Referral: GI Routine preventative health maintenance measures emphasized. Please refer to After Visit Summary for other counseling  recommendations.      Zelda Hummer, CNM [facility]  [facility],  [facility]

## 2024-06-12 ENCOUNTER — Encounter: Payer: Self-pay | Admitting: Certified Nurse Midwife

## 2024-09-19 ENCOUNTER — Ambulatory Visit (INDEPENDENT_AMBULATORY_CARE_PROVIDER_SITE_OTHER)

## 2024-09-19 VITALS — BP 104/78 | HR 78 | Ht 60.0 in | Wt 177.0 lb

## 2024-09-19 DIAGNOSIS — R102 Pelvic and perineal pain unspecified side: Secondary | ICD-10-CM

## 2024-09-19 DIAGNOSIS — R300 Dysuria: Secondary | ICD-10-CM | POA: Diagnosis not present

## 2024-09-19 LAB — POCT URINALYSIS DIPSTICK
Bilirubin, UA: NEGATIVE
Blood, UA: POSITIVE
Glucose, UA: NEGATIVE
Ketones, UA: NEGATIVE
Nitrite, UA: NEGATIVE
Protein, UA: POSITIVE — AB
Spec Grav, UA: 1.015 (ref 1.010–1.025)
Urobilinogen, UA: NEGATIVE U/dL — AB
pH, UA: 6 (ref 5.0–8.0)

## 2024-09-19 MED ORDER — NITROFURANTOIN MONOHYD MACRO 100 MG PO CAPS: 100.0000 mg | ORAL_CAPSULE | Freq: Two times a day (BID) | ORAL | 0 refills | Status: AC

## 2024-09-19 NOTE — Progress Notes (Signed)
    NURSE VISIT NOTE  Subjective:    Patient ID: Alexandra Joseph, female    DOB: [birth year], 28 y.o.   MRN: [id number]       HPI  Patient is a 28 y.o. G11P1001 female who presents for dysuria, flank pain, and pelvic pain for 1 day.  Patient denies hematuria, urinary frequency, and urinary urgency.  Patient does have a history of recurrent UTI.  Patient does not have a history of pyelonephritis.    Objective:    BP 104/78   Pulse 78   Ht 5' (1.524 m)   Wt 177 lb (80.3 kg)   BMI 34.57 kg/m    Lab Review  Results for orders placed or performed in visit on 09/19/24  POCT Urinalysis Dipstick  Result Value Ref Range   Color, UA     Clarity, UA     Glucose, UA Negative Negative   Bilirubin, UA Negative    Ketones, UA Negative    Spec Grav, UA 1.015 1.010 - 1.025   Blood, UA Positive    pH, UA 6.0 5.0 - 8.0   Protein, UA Positive (A) Negative   Urobilinogen, UA negative (A) 0.2 or 1.0 E.U./dL   Nitrite, UA Negative    Leukocytes, UA Trace (A) Negative   Appearance     Odor      Assessment:   1. Dysuria   2. Pelvic pain      Plan:   Urine Culture Sent. Treatment  Macrobid  100 mg PO BID for 7 days.   Camelia Fetters, CMA [facility]

## 2024-09-19 NOTE — Patient Instructions (Signed)

## 2024-09-24 LAB — URINE CULTURE

## 2024-09-25 ENCOUNTER — Encounter: Payer: Self-pay | Admitting: Certified Nurse Midwife

## 2024-09-25 ENCOUNTER — Other Ambulatory Visit: Payer: Self-pay | Admitting: Certified Nurse Midwife
